# Patient Record
Sex: Male | Born: 1935 | Race: White | Hispanic: No | Marital: Married | Smoking: Former smoker
Health system: Southern US, Community
[De-identification: ages and names within clinical notes are randomized; demographics above are authoritative.]

## PROBLEM LIST (undated history)

## (undated) DIAGNOSIS — F0390 Unspecified dementia without behavioral disturbance: Secondary | ICD-10-CM

## (undated) DIAGNOSIS — C189 Malignant neoplasm of colon, unspecified: Secondary | ICD-10-CM

## (undated) DIAGNOSIS — F329 Major depressive disorder, single episode, unspecified: Secondary | ICD-10-CM

## (undated) DIAGNOSIS — I10 Essential (primary) hypertension: Secondary | ICD-10-CM

## (undated) DIAGNOSIS — I350 Nonrheumatic aortic (valve) stenosis: Secondary | ICD-10-CM

## (undated) DIAGNOSIS — N400 Enlarged prostate without lower urinary tract symptoms: Secondary | ICD-10-CM

## (undated) DIAGNOSIS — Z8669 Personal history of other diseases of the nervous system and sense organs: Secondary | ICD-10-CM

## (undated) DIAGNOSIS — Z79899 Other long term (current) drug therapy: Secondary | ICD-10-CM

## (undated) DIAGNOSIS — G63 Polyneuropathy in diseases classified elsewhere: Secondary | ICD-10-CM

## (undated) DIAGNOSIS — G609 Hereditary and idiopathic neuropathy, unspecified: Secondary | ICD-10-CM

## (undated) DIAGNOSIS — K5790 Diverticulosis of intestine, part unspecified, without perforation or abscess without bleeding: Secondary | ICD-10-CM

## (undated) DIAGNOSIS — N186 End stage renal disease: Secondary | ICD-10-CM

## (undated) DIAGNOSIS — D649 Anemia, unspecified: Secondary | ICD-10-CM

## (undated) DIAGNOSIS — G459 Transient cerebral ischemic attack, unspecified: Secondary | ICD-10-CM

## (undated) DIAGNOSIS — E039 Hypothyroidism, unspecified: Secondary | ICD-10-CM

## (undated) DIAGNOSIS — K635 Polyp of colon: Secondary | ICD-10-CM

## (undated) DIAGNOSIS — N19 Unspecified kidney failure: Secondary | ICD-10-CM

## (undated) DIAGNOSIS — D61818 Other pancytopenia: Secondary | ICD-10-CM

## (undated) DIAGNOSIS — I4891 Unspecified atrial fibrillation: Secondary | ICD-10-CM

## (undated) DIAGNOSIS — F32A Depression, unspecified: Secondary | ICD-10-CM

## (undated) DIAGNOSIS — K648 Other hemorrhoids: Secondary | ICD-10-CM

## (undated) DIAGNOSIS — G610 Guillain-Barre syndrome: Secondary | ICD-10-CM

## (undated) DIAGNOSIS — K469 Unspecified abdominal hernia without obstruction or gangrene: Secondary | ICD-10-CM

## (undated) DIAGNOSIS — K6289 Other specified diseases of anus and rectum: Secondary | ICD-10-CM

## (undated) DIAGNOSIS — R569 Unspecified convulsions: Secondary | ICD-10-CM

## (undated) DIAGNOSIS — E785 Hyperlipidemia, unspecified: Secondary | ICD-10-CM

## (undated) DIAGNOSIS — Z8619 Personal history of other infectious and parasitic diseases: Secondary | ICD-10-CM

## (undated) DIAGNOSIS — H9190 Unspecified hearing loss, unspecified ear: Secondary | ICD-10-CM

## (undated) DIAGNOSIS — E213 Hyperparathyroidism, unspecified: Secondary | ICD-10-CM

## (undated) HISTORY — DX: Unspecified abdominal hernia without obstruction or gangrene: K46.9

## (undated) HISTORY — DX: Unspecified dementia, unspecified severity, without behavioral disturbance, psychotic disturbance, mood disturbance, and anxiety: F03.90

## (undated) HISTORY — DX: Guillain-Barre syndrome: G61.0

## (undated) HISTORY — DX: Other long term (current) drug therapy: Z79.899

## (undated) HISTORY — DX: Hereditary and idiopathic neuropathy, unspecified: G60.9

## (undated) HISTORY — DX: Essential (primary) hypertension: I10

## (undated) HISTORY — DX: Personal history of other infectious and parasitic diseases: Z86.19

## (undated) HISTORY — DX: Anemia, unspecified: D64.9

## (undated) HISTORY — DX: End stage renal disease: N18.6

## (undated) HISTORY — PX: SKIN CANCER EXCISION: SHX779

## (undated) HISTORY — DX: Depression, unspecified: F32.A

## (undated) HISTORY — DX: Polyneuropathy in diseases classified elsewhere: G63

## (undated) HISTORY — PX: KIDNEY TRANSPLANT: SHX239

## (undated) HISTORY — DX: Polyp of colon: K63.5

## (undated) HISTORY — DX: Major depressive disorder, single episode, unspecified: F32.9

## (undated) HISTORY — DX: Nonrheumatic aortic (valve) stenosis: I35.0

## (undated) HISTORY — DX: Other hemorrhoids: K64.8

## (undated) HISTORY — DX: Unspecified convulsions: R56.9

## (undated) HISTORY — DX: Benign prostatic hyperplasia without lower urinary tract symptoms: N40.0

## (undated) HISTORY — DX: Hyperlipidemia, unspecified: E78.5

## (undated) HISTORY — DX: Malignant neoplasm of colon, unspecified: C18.9

## (undated) HISTORY — DX: Hypothyroidism, unspecified: E03.9

## (undated) HISTORY — DX: Unspecified kidney failure: N19

## (undated) HISTORY — DX: Diverticulosis of intestine, part unspecified, without perforation or abscess without bleeding: K57.90

## (undated) HISTORY — DX: Unspecified atrial fibrillation: I48.91

## (undated) HISTORY — DX: Transient cerebral ischemic attack, unspecified: G45.9

## (undated) HISTORY — DX: Other specified diseases of anus and rectum: K62.89

---

## 1997-05-17 ENCOUNTER — Other Ambulatory Visit: Admission: RE | Admit: 1997-05-17 | Discharge: 1997-05-17 | Payer: Self-pay | Admitting: Nephrology

## 1997-07-07 ENCOUNTER — Other Ambulatory Visit: Admission: RE | Admit: 1997-07-07 | Discharge: 1997-07-07 | Payer: Self-pay | Admitting: Nephrology

## 1999-02-08 ENCOUNTER — Inpatient Hospital Stay (HOSPITAL_COMMUNITY): Admission: EM | Admit: 1999-02-08 | Discharge: 1999-02-10 | Payer: Self-pay | Admitting: Emergency Medicine

## 1999-02-08 ENCOUNTER — Encounter (INDEPENDENT_AMBULATORY_CARE_PROVIDER_SITE_OTHER): Payer: Self-pay | Admitting: *Deleted

## 1999-02-08 ENCOUNTER — Ambulatory Visit (HOSPITAL_COMMUNITY): Admission: RE | Admit: 1999-02-08 | Discharge: 1999-02-08 | Payer: Self-pay | Admitting: Internal Medicine

## 1999-02-08 ENCOUNTER — Encounter: Payer: Self-pay | Admitting: Internal Medicine

## 1999-03-23 ENCOUNTER — Emergency Department (HOSPITAL_COMMUNITY): Admission: EM | Admit: 1999-03-23 | Discharge: 1999-03-23 | Payer: Self-pay | Admitting: Emergency Medicine

## 1999-06-05 ENCOUNTER — Encounter (INDEPENDENT_AMBULATORY_CARE_PROVIDER_SITE_OTHER): Payer: Self-pay | Admitting: *Deleted

## 1999-06-05 ENCOUNTER — Inpatient Hospital Stay (HOSPITAL_COMMUNITY): Admission: AD | Admit: 1999-06-05 | Discharge: 1999-06-11 | Payer: Self-pay | Admitting: Nephrology

## 1999-06-05 ENCOUNTER — Encounter: Payer: Self-pay | Admitting: Gastroenterology

## 1999-06-07 ENCOUNTER — Encounter: Payer: Self-pay | Admitting: Gastroenterology

## 1999-12-14 ENCOUNTER — Encounter: Payer: Self-pay | Admitting: Nephrology

## 1999-12-14 ENCOUNTER — Encounter: Admission: RE | Admit: 1999-12-14 | Discharge: 1999-12-14 | Payer: Self-pay | Admitting: Nephrology

## 1999-12-19 ENCOUNTER — Encounter: Admission: RE | Admit: 1999-12-19 | Discharge: 1999-12-19 | Payer: Self-pay | Admitting: Infectious Diseases

## 1999-12-26 ENCOUNTER — Encounter: Admission: RE | Admit: 1999-12-26 | Discharge: 1999-12-26 | Payer: Self-pay | Admitting: Infectious Diseases

## 2000-01-09 ENCOUNTER — Encounter: Admission: RE | Admit: 2000-01-09 | Discharge: 2000-01-09 | Payer: Self-pay | Admitting: Infectious Diseases

## 2000-07-04 ENCOUNTER — Encounter: Admission: RE | Admit: 2000-07-04 | Discharge: 2000-07-04 | Payer: Self-pay | Admitting: Infectious Diseases

## 2000-07-11 ENCOUNTER — Encounter: Admission: RE | Admit: 2000-07-11 | Discharge: 2000-07-11 | Payer: Self-pay | Admitting: Infectious Diseases

## 2000-07-25 ENCOUNTER — Inpatient Hospital Stay (HOSPITAL_COMMUNITY): Admission: AD | Admit: 2000-07-25 | Discharge: 2000-07-27 | Payer: Self-pay | Admitting: Nephrology

## 2000-07-26 ENCOUNTER — Encounter (INDEPENDENT_AMBULATORY_CARE_PROVIDER_SITE_OTHER): Payer: Self-pay | Admitting: *Deleted

## 2000-07-30 ENCOUNTER — Encounter: Admission: RE | Admit: 2000-07-30 | Discharge: 2000-07-30 | Payer: Self-pay | Admitting: Infectious Diseases

## 2000-10-29 ENCOUNTER — Encounter: Admission: RE | Admit: 2000-10-29 | Discharge: 2000-10-29 | Payer: Self-pay | Admitting: Infectious Diseases

## 2000-11-25 ENCOUNTER — Ambulatory Visit (HOSPITAL_COMMUNITY): Admission: RE | Admit: 2000-11-25 | Discharge: 2000-11-25 | Payer: Self-pay | Admitting: Internal Medicine

## 2000-11-25 ENCOUNTER — Encounter: Payer: Self-pay | Admitting: Internal Medicine

## 2001-01-27 ENCOUNTER — Encounter: Payer: Self-pay | Admitting: Oncology

## 2001-01-27 ENCOUNTER — Encounter (INDEPENDENT_AMBULATORY_CARE_PROVIDER_SITE_OTHER): Payer: Self-pay | Admitting: Specialist

## 2001-01-27 ENCOUNTER — Other Ambulatory Visit: Admission: RE | Admit: 2001-01-27 | Discharge: 2001-01-27 | Payer: Self-pay | Admitting: Oncology

## 2001-01-29 ENCOUNTER — Encounter: Payer: Self-pay | Admitting: Nephrology

## 2001-01-29 ENCOUNTER — Inpatient Hospital Stay (HOSPITAL_COMMUNITY): Admission: AD | Admit: 2001-01-29 | Discharge: 2001-02-10 | Payer: Self-pay | Admitting: Nephrology

## 2001-01-30 ENCOUNTER — Encounter: Payer: Self-pay | Admitting: Nephrology

## 2001-02-04 ENCOUNTER — Encounter: Payer: Self-pay | Admitting: Oncology

## 2001-02-07 ENCOUNTER — Encounter: Payer: Self-pay | Admitting: Nephrology

## 2001-02-10 ENCOUNTER — Encounter (INDEPENDENT_AMBULATORY_CARE_PROVIDER_SITE_OTHER): Payer: Self-pay | Admitting: Specialist

## 2001-02-10 ENCOUNTER — Inpatient Hospital Stay (HOSPITAL_COMMUNITY)
Admission: RE | Admit: 2001-02-10 | Discharge: 2001-02-24 | Payer: Self-pay | Admitting: Physical Medicine & Rehabilitation

## 2001-07-02 ENCOUNTER — Encounter: Payer: Self-pay | Admitting: Nephrology

## 2001-07-02 ENCOUNTER — Inpatient Hospital Stay (HOSPITAL_COMMUNITY): Admission: EM | Admit: 2001-07-02 | Discharge: 2001-07-08 | Payer: Self-pay | Admitting: Emergency Medicine

## 2001-07-03 ENCOUNTER — Encounter: Payer: Self-pay | Admitting: Nephrology

## 2002-06-28 ENCOUNTER — Inpatient Hospital Stay (HOSPITAL_COMMUNITY): Admission: EM | Admit: 2002-06-28 | Discharge: 2002-07-03 | Payer: Self-pay | Admitting: Nephrology

## 2002-06-28 ENCOUNTER — Encounter: Payer: Self-pay | Admitting: Nephrology

## 2002-06-29 ENCOUNTER — Encounter: Payer: Self-pay | Admitting: Nephrology

## 2002-06-29 ENCOUNTER — Encounter (INDEPENDENT_AMBULATORY_CARE_PROVIDER_SITE_OTHER): Payer: Self-pay | Admitting: Cardiology

## 2002-07-01 ENCOUNTER — Encounter: Payer: Self-pay | Admitting: Nephrology

## 2002-08-16 ENCOUNTER — Emergency Department (HOSPITAL_COMMUNITY): Admission: EM | Admit: 2002-08-16 | Discharge: 2002-08-16 | Payer: Self-pay | Admitting: Emergency Medicine

## 2003-02-26 ENCOUNTER — Inpatient Hospital Stay (HOSPITAL_COMMUNITY): Admission: AD | Admit: 2003-02-26 | Discharge: 2003-03-02 | Payer: Self-pay | Admitting: Nephrology

## 2003-03-01 ENCOUNTER — Encounter (INDEPENDENT_AMBULATORY_CARE_PROVIDER_SITE_OTHER): Payer: Self-pay | Admitting: Cardiology

## 2003-04-26 ENCOUNTER — Encounter (HOSPITAL_COMMUNITY): Admission: RE | Admit: 2003-04-26 | Discharge: 2003-07-25 | Payer: Self-pay | Admitting: Neurology

## 2003-04-28 ENCOUNTER — Ambulatory Visit (HOSPITAL_COMMUNITY): Admission: RE | Admit: 2003-04-28 | Discharge: 2003-04-28 | Payer: Self-pay | Admitting: Nephrology

## 2003-06-16 ENCOUNTER — Inpatient Hospital Stay (HOSPITAL_COMMUNITY): Admission: EM | Admit: 2003-06-16 | Discharge: 2003-06-30 | Payer: Self-pay | Admitting: Emergency Medicine

## 2004-02-09 ENCOUNTER — Ambulatory Visit: Payer: Self-pay | Admitting: Oncology

## 2004-06-04 ENCOUNTER — Ambulatory Visit: Payer: Self-pay | Admitting: Oncology

## 2004-09-26 ENCOUNTER — Ambulatory Visit: Payer: Self-pay | Admitting: Oncology

## 2004-12-05 ENCOUNTER — Inpatient Hospital Stay (HOSPITAL_COMMUNITY): Admission: EM | Admit: 2004-12-05 | Discharge: 2004-12-07 | Payer: Self-pay | Admitting: Emergency Medicine

## 2004-12-07 ENCOUNTER — Encounter: Payer: Self-pay | Admitting: Cardiology

## 2004-12-07 ENCOUNTER — Ambulatory Visit: Payer: Self-pay | Admitting: Cardiology

## 2005-02-17 IMAGING — CR DG ABD PORTABLE 1V
1 series · 1 of 1 positions shown · non-contrast
Comparison: 06/24/03.

CLINICAL DATA: Hypertension, feeding tube placement. 
 PORTABLE ABDOMEN 06/25/03 AT 2575 HOURS

[view not recorded]
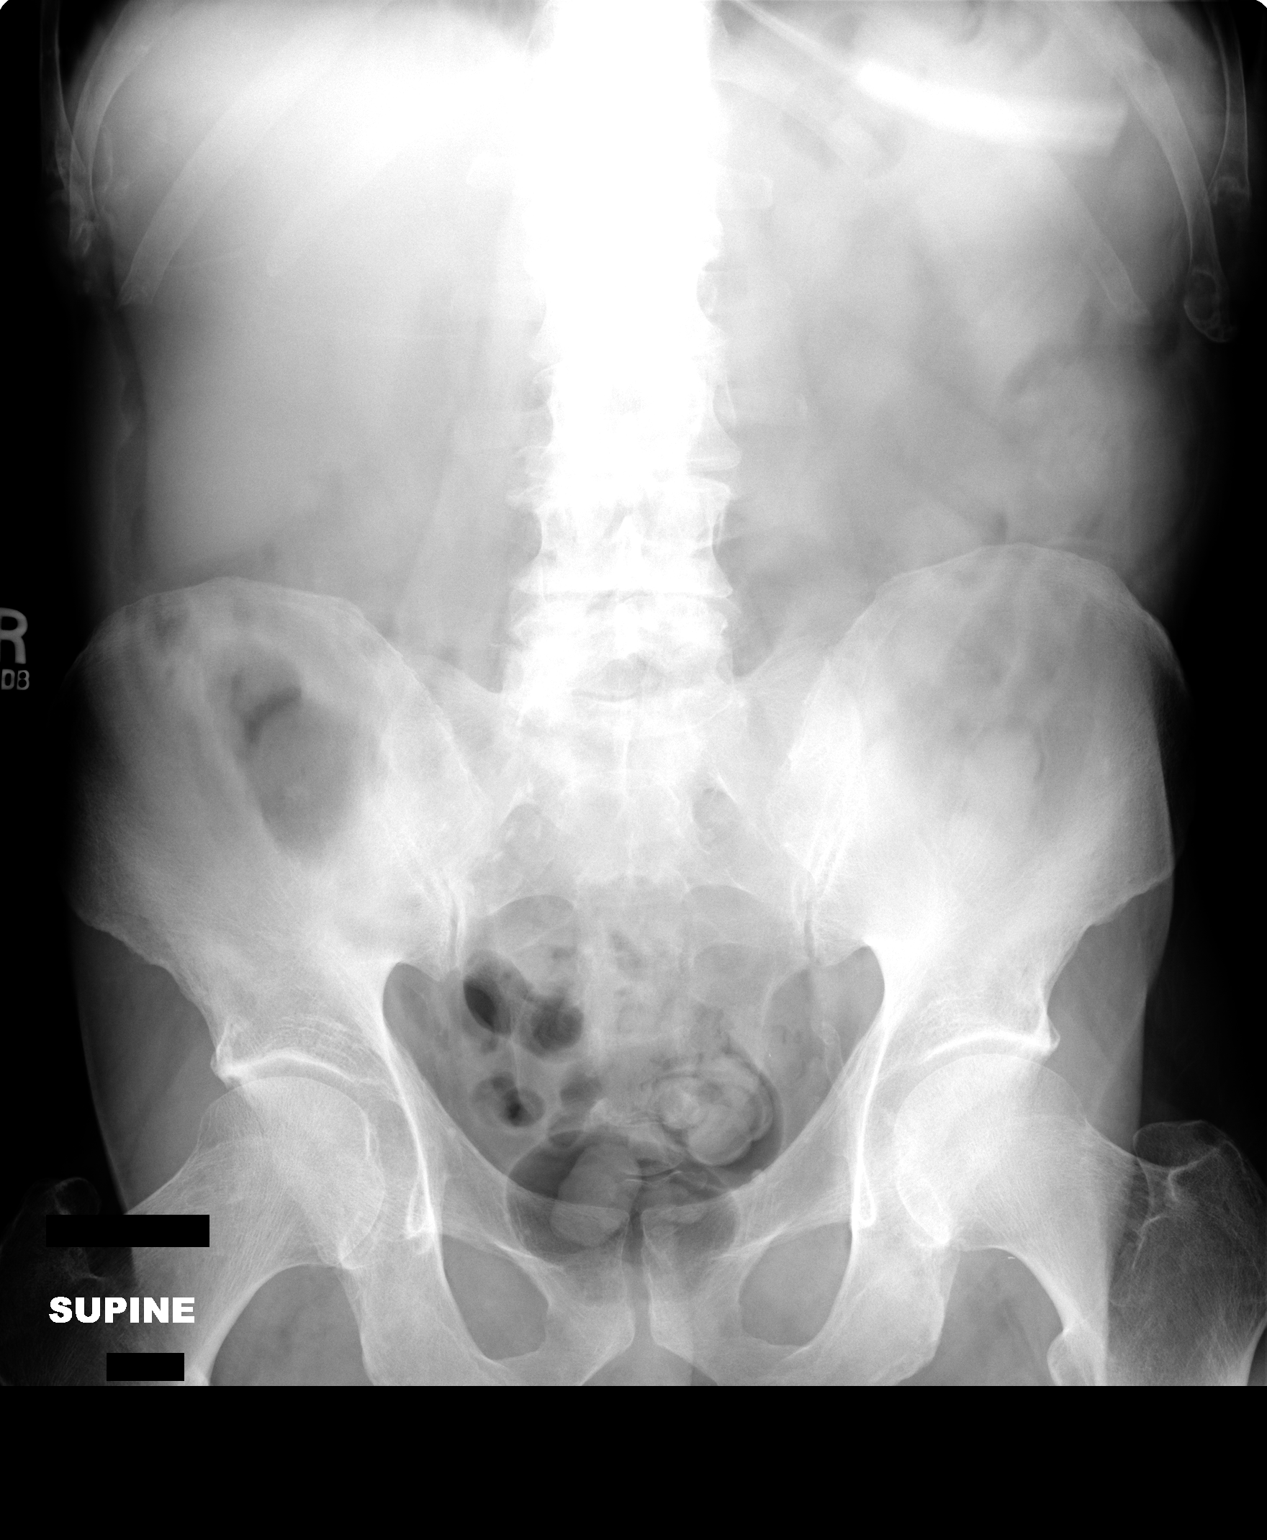

[1 of 1 positions shown; findings below may reference images not displayed]

A Panda type feeding tube remains with its tip in the fundus of the stomach.  Normal bowel gas pattern.  Minimal degenerative changes in the lumbar spine.  
 IMPRESSION
 Feeding tube tip in gastric fundus.

## 2006-02-12 ENCOUNTER — Inpatient Hospital Stay (HOSPITAL_COMMUNITY): Admission: AD | Admit: 2006-02-12 | Discharge: 2006-02-24 | Payer: Self-pay | Admitting: Nephrology

## 2006-05-02 ENCOUNTER — Inpatient Hospital Stay (HOSPITAL_COMMUNITY): Admission: EM | Admit: 2006-05-02 | Discharge: 2006-05-08 | Payer: Self-pay | Admitting: Nephrology

## 2007-12-03 ENCOUNTER — Ambulatory Visit: Payer: Self-pay | Admitting: *Deleted

## 2008-02-23 ENCOUNTER — Ambulatory Visit: Payer: Self-pay | Admitting: Vascular Surgery

## 2008-12-28 ENCOUNTER — Ambulatory Visit: Payer: Self-pay | Admitting: Pulmonary Disease

## 2008-12-28 ENCOUNTER — Inpatient Hospital Stay (HOSPITAL_COMMUNITY): Admission: EM | Admit: 2008-12-28 | Discharge: 2009-01-01 | Payer: Self-pay | Admitting: Emergency Medicine

## 2008-12-29 ENCOUNTER — Encounter (INDEPENDENT_AMBULATORY_CARE_PROVIDER_SITE_OTHER): Payer: Self-pay | Admitting: *Deleted

## 2008-12-29 ENCOUNTER — Ambulatory Visit: Payer: Self-pay | Admitting: Vascular Surgery

## 2008-12-29 ENCOUNTER — Encounter (INDEPENDENT_AMBULATORY_CARE_PROVIDER_SITE_OTHER): Payer: Self-pay | Admitting: Internal Medicine

## 2008-12-31 ENCOUNTER — Encounter (INDEPENDENT_AMBULATORY_CARE_PROVIDER_SITE_OTHER): Payer: Self-pay | Admitting: *Deleted

## 2009-05-05 ENCOUNTER — Ambulatory Visit: Payer: Self-pay | Admitting: Family Medicine

## 2009-05-05 ENCOUNTER — Inpatient Hospital Stay (HOSPITAL_COMMUNITY): Admission: EM | Admit: 2009-05-05 | Discharge: 2009-05-08 | Payer: Self-pay | Admitting: Emergency Medicine

## 2009-06-13 ENCOUNTER — Telehealth: Payer: Self-pay | Admitting: Internal Medicine

## 2009-06-14 ENCOUNTER — Encounter (INDEPENDENT_AMBULATORY_CARE_PROVIDER_SITE_OTHER): Payer: Self-pay | Admitting: *Deleted

## 2009-08-14 DIAGNOSIS — K5730 Diverticulosis of large intestine without perforation or abscess without bleeding: Secondary | ICD-10-CM | POA: Insufficient documentation

## 2009-08-14 DIAGNOSIS — D638 Anemia in other chronic diseases classified elsewhere: Secondary | ICD-10-CM

## 2009-08-14 DIAGNOSIS — K449 Diaphragmatic hernia without obstruction or gangrene: Secondary | ICD-10-CM | POA: Insufficient documentation

## 2009-08-14 DIAGNOSIS — K648 Other hemorrhoids: Secondary | ICD-10-CM | POA: Insufficient documentation

## 2009-08-14 DIAGNOSIS — Z860100 Personal history of colon polyps, unspecified: Secondary | ICD-10-CM | POA: Insufficient documentation

## 2009-08-14 DIAGNOSIS — I359 Nonrheumatic aortic valve disorder, unspecified: Secondary | ICD-10-CM

## 2009-08-14 DIAGNOSIS — Z87898 Personal history of other specified conditions: Secondary | ICD-10-CM

## 2009-08-14 DIAGNOSIS — N186 End stage renal disease: Secondary | ICD-10-CM | POA: Insufficient documentation

## 2009-08-14 DIAGNOSIS — E039 Hypothyroidism, unspecified: Secondary | ICD-10-CM | POA: Insufficient documentation

## 2009-08-14 DIAGNOSIS — E785 Hyperlipidemia, unspecified: Secondary | ICD-10-CM | POA: Insufficient documentation

## 2009-08-14 DIAGNOSIS — E211 Secondary hyperparathyroidism, not elsewhere classified: Secondary | ICD-10-CM

## 2009-08-14 DIAGNOSIS — K6289 Other specified diseases of anus and rectum: Secondary | ICD-10-CM

## 2009-08-14 DIAGNOSIS — I10 Essential (primary) hypertension: Secondary | ICD-10-CM | POA: Insufficient documentation

## 2009-08-14 DIAGNOSIS — M109 Gout, unspecified: Secondary | ICD-10-CM

## 2009-08-14 DIAGNOSIS — Z8719 Personal history of other diseases of the digestive system: Secondary | ICD-10-CM

## 2009-08-14 DIAGNOSIS — Z992 Dependence on renal dialysis: Secondary | ICD-10-CM

## 2009-08-17 ENCOUNTER — Ambulatory Visit: Payer: Self-pay | Admitting: Internal Medicine

## 2010-01-11 ENCOUNTER — Ambulatory Visit: Payer: Self-pay | Admitting: Vascular Surgery

## 2010-02-18 ENCOUNTER — Encounter: Payer: Self-pay | Admitting: Neurology

## 2010-03-01 NOTE — Progress Notes (Signed)
Summary: next COL?  Phone Note From Other Clinic Call back at [phone]   Caller: Danford Bad, nurse with Dr. Jeanie Sewer Call For: Dr. Juanda Chance Summary of Call: would like to know when pt is due for next COL Initial call taken by: Vallarie Mare,  Jun 13, 2009 2:11 PM  Follow-up for Phone Call        DR.Kilynn Fitzsimmons-His chart has been ordered for your review. Please advise. Follow-up by: Laureen Ochs LPN,  Jun 13, 2009 3:06 PM  Additional Follow-up for Phone Call Additional follow up Details #1::        last colon April 2001, he is due for repeat colon now ( 10 years), we banded his hems in 2002. He has multiple medical problema and he would need an OV before colonoscopy. Additional Follow-up by: Hart Carwin MD,  Jun 13, 2009 10:45 PM    Additional Follow-up for Phone Call Additional follow up Details #2::    Toniann Fail notified. Patient  needs an appointment I have offered to schedule an appointment.  She wantst the patient to call and schedule an NP3 with Dr Juanda Chance  Follow-up by: Darcey Nora RN, CGRN,  Jun 14, 2009 10:32 AM

## 2010-03-01 NOTE — Consult Note (Signed)
Summary: Retroperitoneal Hematoma   NAME:  [name], [name]                ACCOUNT NO.:  [id number]      MEDICAL RECORD NO.:  [id number]          PATIENT TYPE:  INP      LOCATION:  2602                         FACILITY:  [facility]      PHYSICIAN:  Larina Earthly, M.D.    DATE OF BIRTH:  03-Apr-1926      DATE OF CONSULTATION:  12/29/2008   DATE OF DISCHARGE:                                    CONSULTATION      REQUESTING PHYSICIAN:  Isidor Holts, MD      REASON FOR CONSULTATION:  Evaluation for retroperitoneal hematoma.      PRIMARY PHYSICIAN:  Gwendlyn Deutscher, MD      CARDIOLOGIST:  Thereasa Solo. Little, MD      NEPHROLOGIST:  Garnetta Buddy, MD      HISTORY:  [name] is an 77 year old Caucasian male with significant past   medical history including end-stage renal disease, ITP, and seizure   disorder who was admitted to [facility] on December 28, 2008.   He was admitted for symptomatic anemia.  He had fallen onto a boat   approximately 6 weeks ago and bruised his left ribs.  About 3 weeks ago,   he also had 1 day of severe nausea and vomiting with dry heaving.  This   led to some abdominal soreness.  He also had an episode of syncope   while in the bathroom around the same time.  Since then, he has had no   further nausea and vomiting and feels his abdominal soreness is   improving.  He has continued to feel quite fatigued, however and has had   continued presyncopal episodes.  Yesterday, he saw his family physician   and labs were ordered with results showing a drop in his hemoglobin to   around 7.5.  This was reportedly decreased from a hemoglobin of around   11 2-3 weeks prior.  He was admitted by the [facility] for   anemia workup and transfusion.  Part of this workup consisted of a CT of   the abdomen and pelvis with contrast with findings showing extensive   multiloculated right pararenal and retroperitoneal fluid collection,   high attenuation in the pararenal  collection suggested hematoma although   the radiologist felt that abscess could not be excluded.  There was   interval increase in the size of hyperdense lesion from the lower pole   of the left kidney and finding of cholelithiasis with no acute pelvic   process.  His admission hemoglobin and hematocrit were 7.9 and 23.1   respectively.  He received 1 unit of packed red blood cells with his   post transfusion labs showing a hemoglobin of 10.4 and hematocrit of 30.   A repeat hemoglobin/hematocrit were ordered and about 6 hours later   which showed a decrease in his hemoglobin now down to 7.8 and hematocrit   of 22.9.  A repeat CBC is still pending.  Vascular Surgery consultation   was requested for evaluation  of a retroperitoneal hematoma and concern   for active bleeding with approximately a three point drop in his   hemoglobin over the previous 6 hours.      PAST MEDICAL HISTORY:   1. End-stage renal disease on hemodialysis Monday, Wednesday, and       Friday in [place].   2. Status post renal transplant in 1998 which failed in 2003 and he is       status post transplant nephrectomy in March 2004.   3. Left Cimino AV fistula in 1997.   4. Dyslipidemia.   5. Hypothyroidism.   6. CMV colitis and encephalitis.   7. Internal hemorrhoids status post banding in 2002.   8. BPH with history of TURP.   9. History of idiopathic thrombocytopenia purpura with reported       platelet count as low as 7000.  He is status post bone marrow       biopsy in 2003 by Dr. Donnie Coffin.   10.History of Guillain-Barre status post plasmapheresis in 2003 and       2005 by Dr. Sandria Manly.   11.History of depression.   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   13.History of anemia.   14.Secondary hypoparathyroidism.   15.History of gout.   16.History of right upper quadrant protrusion times at least 10 years       felt to be an incisional hernia.   17.Thought to have a TIA in 2003 but later felt that he  actually had a       seizure disorder.  He has had no seizures in 2-3 years since being       started on Dilantin.   18.Treatment for excision of left forehead melanoma in 2007.   19.History of transient atrial fibrillation.   20.History of mild aortic stenosis, an ejection fraction of 55% per       echocardiogram in 2005.   21.Prior history had indicated that he had had history of pulmonary       embolism in 2004 and was treated with Coumadin.  However, the       patient and his wife denies this.      ALLERGIES:  He developed severe peripheral neuropathy in his lower   extremities secondary to PROGRAF.      MEDICATIONS:   1. Sorbitol p.r.n.   2. Vitamin D3 2000 units daily.   3. Lisinopril 20 mg 2 tablets b.i.d.   4. Norvasc 10 mg p.o. q.p.m.   5. Dilantin 100 mg 4-5 tablets daily.   6. Colchicine 0.6 mg daily.   7. Coreg 3.125 mg b.i.d.   8. Folic acid 1 mg daily.   9. Folast one daily.   10.Dialyvite daily.      SOCIAL HISTORY:  He is married, he [place]  He did not use alcohol or tobacco for at least 4 years.   He does not drive.      FAMILY HISTORY:  His mother died recently at age 73 of old age.  His   father did have a history of a stroke.  He has a brother who has had a   history of a stroke and diabetes mellitus and glaucoma.  He has a sister   with glaucoma.      REVIEW OF SYSTEMS:  GENERAL:  He had no recent dramatic weight loss but   does report he has lost around 50 pounds gradually over the last 10  years.  He denies fever.  He has had some intermittent chills.  HEENT:   He denies dysphagia.  RESPIRATORY:  He denies shortness of breath.   CARDIAC:  He denies chest pain.  GASTROENTEROLOGY:  See HPI.  Again, he   denies any specific abdominal pain, hematochezia, or recent nausea or   vomiting.  GU:  He does not produce any urine.  NEUROLOGIC:  As   mentioned above, he did have a syncopal episode approximately 3 weeks    ago and has had no syncope since.  MUSCULOSKELETAL:  He has had some   right groin pain for the last few weeks that is increased with raising   his leg.  He has seen his primary physician for this and was supposed to   have an x-ray to evaluate for arthritis.  This was scheduled for today   but will not be done secondary to his hospitalization and require   rescheduling.  HEMATOLOGIC:  He denies any recent infection.  He does   report that he bruises easily.  He has had no obvious signs of bleeding   such as epistaxis or hematochezia.  Again, he denies any history of   prior pulmonary embolism.  PSYCHIATRIC:  He has a history of depression.      PHYSICAL EXAMINATION:  VITAL SIGNS:  Blood pressure 147/59, oxygen   saturation 100% on room air, heart rate 62, respirations 20, temperature   98.4.  GENERAL APPEARANCE:  This is an 77 year old Caucasian male who   appears his stated age.  He is alert and does not appear in any acute   distress.   HEENT:  Pupils are equal, round, and reactive to light.  Sclerae are non-   icteric.  His oral mucosa is pink and moist.  No pharyngeal erythema is   noted.   NECK:  No thyromegaly or lymphadenopathy is noted.  He has palpable   carotid pulses.  No bruits were auscultated.   CHEST:  His lung sounds were clear but diminished on the bases.   Breathing is unlabored.   CARDIAC:  His heart sounds are somewhat faint.  He has a regular rate   and rhythm with 1-2/6 systolic murmur.  No carotid bruits auscultated.   ABDOMEN:  His abdomen is soft, nontender, other than reported mild   soreness in the epigastric region.  There was no guarding.  He has good   bowel sounds.  He has a right upper quadrant scar.  Just lateral to the   scar, there is a soft, nontender protrusion possibly incisional hernia.   No hepatomegaly appreciated.   GENITOURINARY:  Deferred.   EXTREMITIES:  His extremities show no edema.  He does have evidence of a   left Cimino AV fistula  which has two large aneurysms.  There, the skin   is intact.  There is a good thrill.  Both hands are warm.  He has a 1+   ulnar pulse on the left, 2+ radial pulse on the right.  He has 2 to 3+   femoral pulses bilaterally.  On the right, his PT pulse is 1+, DP pulse   is 2+.  On the left, his DP pulse is 1+, PT is 2+.   NEUROLOGIC:  He is alert and oriented x4.  His speech is clear.  He   moves all extremities strongly and symmetrically.      LABORATORY DATA/DIAGNOSTICS:  Most recent hemoglobin and hematocrit was   7.8  and 22.9 respectively.  This was down from 10.4 and 30 with his   admission hemoglobin and hematocrit of 7.9 and 23.1 respectively.  His   repeat H and H is pending.  His white count was 5.8, platelet count 143.   Sodium 138, potassium 3.5, BUN of 20, creatinine 1.92, blood glucose of   120.  INR of 1.4 PTT of 41.  Liver function tests were within normal   limits.  Albumin was 2.5.  BMP 193.  CT scan of the abdomen and pelvis   with contrast see history.      ASSESSMENT AND PLAN:   1. The patient has been evaluated by both myself and Dr. Arbie Cookey.  Dr.       Arbie Cookey has personally reviewed the diagnostic studies, specifically       the CT scan of the abdomen and pelvis.  It is felt that Raymond. Botz       has a spontaneous retroperitoneal hematoma.  There is possibly a       traumatic component based on his history of fall approximately 6       weeks ago.  At this point, Dr. Arbie Cookey feels that the retroperitoneal       hematoma is stable.  The post transfusion hemoglobin of 10.4 was       felt probably erroneous as this was nearly a four point jump after       only 1 unit of blood.  The repeat hemoglobin and hematocrit are       pending.  We will continue to monitor the patient's hemoglobin and       hematocrit closely with serial labs.  He may transfuse as needed.       If he has persistent significant drops in his       hemoglobin/hematocrit, we could consider a CTA or an  arteriogram.       We agreed with transfer to the intensive care unit for close       monitoring but feel it is okay for the patient to the eat.  We will       continue to follow.   2. End-stage renal disease on hemodialysis.  He is being followed by       Nephrology for inpatient hemodialysis.  Of note, he was seen       earlier this year at the [facility] office by Dr. Waverly Ferrari to       evaluate his arteriovenous       fistula aneurysms.  We will continue to monitor these as long as he       is having no difficulty with hemodialysis.   3. Hypertension.  Continue his home medications as his blood pressure       tolerates.  Hospitalists are managing.   4. Seizure disorder, controlled on Dilantin.               Jerold Coombe, P.A.               Larina Earthly, M.D.   Electronically Signed         AWZ/MEDQ  D:  12/29/2008  T:  12/30/2008  Job:  [id number]      cc:   [facility]   Thereasa Solo. Little, M.D.   Gwendlyn Deutscher II, M.D.

## 2010-03-01 NOTE — Procedures (Signed)
Summary: SIGMOIDOSCOPY                    [place]  Patient:    [name], [name]                       MRN: [id number] Proc. Date: 07/26/00 Adm. Date:  [id number] Attending:  Dayle Points CC:         Duke Salvia. Eliott Nine, M.D.   Procedure Report  PROCEDURE PERFORMED:  Flexible sigmoidoscopy.  INDICATIONS:  This 77 year old gentleman with chronic renal failure, status post renal transplant, has had CMV colitis which has been treated and has gone into remission.  He had two discrete episodes of the rectal bleeding and his hemoglobin has dropped.  He is undergoing flexible sigmoidoscopy to assess site of the bleeding which is presumably an anal or sigmoid colon origin.  He had previous colonoscopy within the last two years, which did not show any significant lesions.  He had benign polyps in the past.  ENDOSCOPE:  Fujinon single-channel endoscope.  SEDATION:  None.  PREP:  None.  FINDINGS:  Fujinon single-channel endoscope was passed routinely through rectum to the sigmoid colon.  The patient was monitored by pulse oximeter.  He received no sedation and because of his diarrhea did not receive any prep. The perianal area was very erythematous and pruritic.  Rectal tone was increased, was very tight and very painful anal canal.  There were large hemorrhoids internally which were protruding into the rectal ampulla.  As the endoscope was introduced into the rectum, there was a bleeding which was precipitated by distention of the anal canal and there was bright red exuding from the anal canal hemorrhoid.  The sigmoid mucosa was unremarkable.  There was some liquid stool in the sigmoid colon which was suctioned out.  There were no lesions all the way up to 35 cm.  At that point, the endoscope was retracted and colon decompressed.  The patient tolerated the procedure well.  IMPRESSION: 1. Complete resolution of CMV colitis. 2. Anal bleeding, most likely from  hemorrhoids.  PLAN: 1. Rectal care, including Corticaine cream and Anusol-HC suppositories. 2. Discontinuation of his ______, which is causing his diarrhea. DD:  07/26/00 TD:  07/26/00 Job: [id number]

## 2010-03-01 NOTE — Procedures (Signed)
Summary: ENDOSCOPY   EGD  Procedure date:  02/08/1999  Findings:      Location: [facility]                       [place]  Patient:    [name], [name]                         MRN: [id number] Proc. Date: 02/08/99 Attending:  Hedwig Morton. Juanda Patterson, M.D. [facility] CC:         [name], M.D.                           Procedure Report  PROCEDURE:  Upper endoscopy.  INDICATIONS:  This 77 year old white male, renal transplant patient with chronic rejection, has had a progressive drop in his hemogram despite Epogen.  He also ad episodic abdominal pain above the umbilicus in the midline.  He denies taking any NSAIDS.  He has no previous history of upper GI problems.  He had a normal colonoscopy several months ago for follow up of colon polyps.  He denies any visible rectal bleeding.  He is undergoing an upper endoscopy to evaluate his anemia.  ENDOSCOPE:  Fujinon single-channel video endoscope.  SEDATION:  Versed 9 mg IV.  FINDINGS:  ESOPHAGUS:  The Fujinon single-channel video endoscope passed under direct vision through the posterior oropharynx into the esophagus.  The patient was monitored by pulse oximeter.  His oxygen saturations were normal.  The proximal, mid, and distal esophageal mucosa were normal.  There was no esophagitis. There was a mild, nonobstructing, fibrous ring at the GE junction.  Distal to the ring was a large hiatal hernia.  There were no erosions or any signs of bleeding from this area.  STOMACH:  The stomach was insufflated with air and showed a 5 cm hiatal hernia extending from 36-41 cm from the incisors.  The hernia was non-reducible.  The gastric folds were normal throughout the stomach.  The gastric antrum was unremarkable.  A retroflexion of the endoscope confirmed the presence of hiatal  hernia.  The pyloric outlet was initially spastic but opened up with pressure, nd it showed a normal pyloric  channel.  DUODENUM:  The duodenum, duodenal bulb, and descending duodenum were unremarkable. Small bowel biopsies were taken from the descending duodenum to rule-out primary mucosal disease of the small bowel which would cause iron malabsorption.  The patient tolerated the procedure well.  IMPRESSION:  Hiatal hernia, status post CLO test and status post small bowel biopsies.  PLAN:  The patient was reexamined with a rectal exam after the endoscopy, and his stool was Hemoccult negative.  At this time, we do not have any evidence of GI blood loss.  His dropping H&H may be of other etiology other than due to blood loss.  Because of the large hiatal hernia, I recommend antireflux measures and cid suppressing agents on p.r.n. basis.  Will be happy to see him in the office should he develop GI blood loss. DD:  02/08/99 TD:  02/08/99 Job: [id number]

## 2010-03-01 NOTE — Letter (Signed)
Summary: New Patient letter  [facility]  [address] [place], [place] [address]   Phone: [phone]  Fax: [phone]       06/14/2009 MRN: [id number]  [name] [address] [place], [place]  [address]  Dear [name],  Welcome to the Gastroenterology Division at [facility].    You are scheduled to see Dr. Juanda Chance on 08/17/2009 at 1:30PM on the 3rd floor at [facility], [address]  We ask that you try to arrive at our office 15 minutes prior to your appointment time to allow for check-in.  We would like you to complete the enclosed self-administered evaluation form prior to your visit and bring it with you on the day of your appointment.  We will review it with you.  Also, please bring a complete list of all your medications or, if you prefer, bring the medication bottles and we will list them.  Please bring your insurance card so that we may make a copy of it.  If your insurance requires a referral to see a specialist, please bring your referral form from your primary care physician.  Co-payments are due at the time of your visit and may be paid by cash, check or credit card.     Your office visit will consist of a consult with your physician (includes a physical exam), any laboratory testing he/she may order, scheduling of any necessary diagnostic testing (e.g. x-ray, ultrasound, CT-scan), and scheduling of a procedure (e.g. Endoscopy, Colonoscopy) if required.  Please allow enough time on your schedule to allow for any/all of these possibilities.    If you cannot keep your appointment, please call [phone] to cancel or reschedule prior to your appointment date.  This allows [place] the opportunity to schedule an appointment for another patient in need of care.  If you do not cancel or reschedule by 5 p.m. the business day prior to your appointment date, you will be charged a $50.00 late cancellation/no-show fee.    Thank you for choosing  [facility] for your medical needs.  We appreciate the opportunity to care for you.  Please visit [place] at our website  to learn more about our practice.                     Sincerely,                                                             The Gastroenterology Division

## 2010-03-01 NOTE — Procedures (Signed)
Summary: FLEX/BANDING   Flexible Sigmoidoscopy  Procedure date:  11/25/2000  Findings:                                [facility]  Patient:    [name], [name] Visit Number: [id number] MRN: [id number]          Service Type: END Location: ENDO Attending Physician:  Mervin Hack Dictated by:   Hedwig Morton. Juanda Chance, M.D. [facility] Proc. Date: 11/25/00 Admit Date:  11/25/2000   CC:         Fayrene Fearing L. Deterding, M.D.   Procedure Report  PROCEDURE:   Flexible sigmoidoscopy with destruction of internal hemorrhoids.  ENDOSCOPIST:  Hedwig Morton. Juanda Chance, M.D.  INDICATIONS:  This 77 year old gentleman has chronic renal failure and had significant lower GI bleed secondary to hemorrhoids.  This occurred on several occasions requiring blood transfusions in July 2002.  The patient also has a history of CMV colitis which has been currently under control.  Because of his gout, he has been on colchicine 0.6 mg which has intermittently given him diarrhea.  Because of the significant amount of bleeding associated with his hemorrhoids and the failure for the hemorrhoids to respond to conservative measures, he is undergoing attempt at banding at the internal hemorrhoids.  ENDOSCOPE:  Olympus single channel videoscope.  SEDATION:  Versed 5 mg IV, fentanyl 100 mcg IV.  DESCRIPTION OF PROCEDURE: The Olympus single channel videoscope was passed directly into the rectum to the sigmoid colon.  The patient was monitored by the pulse oximeter.  His oxygen saturation was normal. Prep was excellent. Rectal tone was somewhat increased with some irregular anal mucosa and rubbery tissue with some skin tags internally.  On retroflexion, there were first grade hemorrhoids with some hypertrophied papilla at the dentate line and old thrombosed hemorrhoids.  The mucosa of the sigmoid colon was somewhat hypervascular but there was no acute colitis.  Exam was carried up to 40 cm. The endoscope was then  retracted back to the rectum, retroflexed and four hemorrhoids were banded using standard bent ligator by Wilson-Mccahill.  There was as small amount of bleeding from the banded hemorrhoids.  The patient tolerated the procedure well and had minimal discomfort.  IMPRESSION: First grade internal hemorrhoids status post banding x 4.  PLAN: 1. Anusol HC suppository q.d. 2. Lindamantle cream apply topically to the rectum b.i.d. 3. Office visit in one week. Dictated by:   Hedwig Morton. Juanda Chance, M.D. [facility] Attending Physician:  Mervin Hack DD:  11/25/00 TD:  11/25/00 Job: [id number]

## 2010-03-01 NOTE — Assessment & Plan Note (Signed)
Summary: discuss COL...as.   History of Present Illness Visit Type: Initial Consult Primary GI MD: Lina Sar MD Primary Provider: Gwendlyn Deutscher, MD Requesting Provider: Gwendlyn Deutscher, MD Chief Complaint: Discuss colon recall. Pt denies any GI sx at this time. History of Present Illness:   This is an 77 year old white male who is here to discuss having a screening colonoscopy. His last colonoscopy was in May 2001 when he was hospitalized with acute diarrhea and bleeding. He was diagnosed with CMV colitis. Colon biopsies showed a viral cytopathic effect.. A colonoscopy was incomplete ( to the hepatic flexure only)  because of the looping of the scope. He has had several serious medical problems which are limiting his tolerance to conscious sedation and endoscopic procedures. He has chronic renal failure and is status post failed renal transplant in 1998. He is also status  post rejection of nephrectomy in 2004. He has been back on dialysis since 2003. He has a history of ITP and aortic stenosis. He also had a recent hospitalization for volume overload in April 2011. He was hospitalized for anemia in December 2010 due to a retroperitoneal hematoma from a fall. 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 He has asymptomatic  cholelithiasis found incidentally on a CT scan of the abdomen. He has no lower GI complaints. He takes occasional Senokot or stool softeners. There is no family history of colon cancer. An upper endoscopy in January 2001 showed a hiatal hernia 5 cm in length which was not reducible. Small bowel biopsiesconfirmed normal villous pattern   GI Review of Systems      Denies abdominal pain, acid reflux, belching, bloating, chest pain, dysphagia with liquids, dysphagia with solids, heartburn, loss of appetite, nausea, vomiting, vomiting blood, weight loss, and  weight gain.        Denies anal fissure, black tarry stools, change in bowel habit,  constipation, diarrhea, diverticulosis, fecal incontinence, heme positive stool, hemorrhoids, irritable bowel syndrome, jaundice, light color stool, liver problems, rectal bleeding, and  rectal pain. Preventive Screening-Counseling & Management  Alcohol-Tobacco     Smoking Status: never    Current Medications (verified): 1)  Cefuroxime Axetil 500 Mg Tabs (Cefuroxime Axetil) .... Take 1 Tablet By Mouth Two Times A Day 2)  Amlodipine Besylate 10 Mg Tabs (Amlodipine Besylate) .... Take 1 Tablet By Mouth At Bedtime 3)  Aspirin 81 Mg Tbec (Aspirin) .... Take 1 Tablet By Mouth Once A Day 4)  Carvedilol 6.25 Mg Tabs (Carvedilol) .... Take 1 Tablet By Mouth Two Times A Day 5)  Colcrys 0.6 Mg Tabs (Colchicine) .... Take 1 Tablet By Mouth Once Daily 6)  Epogen (Unknown Dosage) .... Take 1 Injetion Every Monday, Wednesday, Friday With Dialysis 7)  Folast 2.8-25-2 Mg Tabs (L-Methylfolate-B6-B12) .... Take 1 Tablet By Mouth Once Daily 8)  Folic Acid 1 Mg Tabs (Folic Acid) .... Take 1 Tablet By Mouth Once Daily 9)  Metronidazole 0.75 % Gel (Metronidazole) .... Apply To Face Topically Twice Daily 10)  Mirtazapine 15 Mg Tabs (Mirtazapine) .... Take 1 Tablet By Mouth Once Daily 11)  Phenytoin Sodium Extended 100 Mg Caps (Phenytoin Sodium Extended) .... Take 2 Tablet By Mouth Two Times A Day 5 Days A Week, 2 Tablet in Am and 3 Tablets in Pm On Saturday and Sunday 12)  Renal Multivitamin/zinc  Tabs (Multiple Vitamin) .... Take 1 Tablet By Mouth Once Daily 13)  Tums 500 Mg Chew (Calcium Carbonate Antacid) .... Take 1 Tablet By Mouth  Two Times A Day As Needed 14)  Venofer 20 Mg/ml Soln (Iron Sucrose) .Marland Kitchen.. 100 Mg During Dialysis Three Times A Week 15)  Vitamin D3 (Unknown Dosage) .... Take 1 Tablet By Mouth Once Daily 16)  Simvastatin 20 Mg Tabs (Simvastatin) .... One Tablet By Mouth Once Daily 17)  Losartan Potassium 50 Mg Tabs (Losartan Potassium) .... One Tablet By Mouth Once Daily 18)  Levothyroxine Sodium 75  Mcg Tabs (Levothyroxine Sodium) .... One Tablet By Mouth Once Daily  Allergies (verified): 1)  ! * Prograf  Past History:  Past Medical History: Current Problems:  COLONIC POLYPS, ADENOMATOUS, HX OF (ICD-V12.72) RENAL FAILURE, END STAGE (ICD-585.6) HYPERLIPIDEMIA (ICD-272.4) GOUT, UNSPECIFIED (ICD-274.9) BENIGN PROSTATIC HYPERTROPHY, HX OF (ICD-V13.8) AORTIC STENOSIS (ICD-424.1) CLOSTRIDIUM DIFFICILE COLITIS, HX OF (ICD-V12.79) HYPERPARATHYROIDISM, SECONDARY (ICD-252.02) ANEMIA OF CHRONIC DISEASE (ICD-285.29) HYPERTENSION (ICD-401.9) HYPOTHYROIDISM (ICD-244.9) HIATAL HERNIA (ICD-553.3) DIVERTICULOSIS, COLON (ICD-562.10) Hx of PROCTITIS (ICD-569.49) HEMORRHOIDS, INTERNAL (ICD-455.0)   Seizures  Past Surgical History: Reviewed history from 08/14/2009 and no changes required. Renal Transplant  Family History: Family History of Diabetes: Brother Family History of Liver Disease: Son with Hepatitis C No FH of Colon Cancer:  Social History: Married Illicit Drug Use - no Patient has never smoked.  Alcohol Use - no Daily Caffeine Use Smoking Status:  never  Review of Systems       The patient complains of arthritis/joint pain, hearing problems, and vision changes.  The patient denies allergy/sinus, anemia, anxiety-new, back pain, blood in urine, breast changes/lumps, change in vision, confusion, cough, coughing up blood, depression-new, fainting, fatigue, fever, headaches-new, heart murmur, heart rhythm changes, itching, menstrual pain, muscle pains/cramps, night sweats, nosebleeds, pregnancy symptoms, shortness of breath, skin rash, sleeping problems, sore throat, swelling of feet/legs, swollen lymph glands, thirst - excessive , urination - excessive , urination changes/pain, urine leakage, and voice change.         Pertinent positive and negative review of systems were noted in the above HPI. All other ROS was otherwise negative.   Vital Signs:  Patient profile:   77 year  old male Height:      67 inches Weight:      145.50 pounds BMI:     22.87 Pulse rate:   74 / minute Pulse rhythm:   regular BP sitting:   162 / 68  (right arm) Cuff size:   regular  Vitals Entered By: Christie Nottingham CMA Duncan Dull) (August 17, 2009 1:27 PM)  Physical Exam  General:  alert, oriented and in no distress, appears weak. Ambulates independently. Eyes:  nonicteric. Mouth:  no lesions. Neck:  bilateral carotid murmurs transmitted from precordium. Lungs:  clear to auscultation. Heart:  2/6 systolic murmur radiating to the carotids, irregular heart rhythm. Abdomen:  soft abdomen with healed surgical scars. Normoactive bowel sounds. No palpable mass or stool. Rectal:  soft Hemoccult negative stool. Extremities:  no edema. AV fistula left forearm. Skin:  Marked hyperpigmentation of his forearms; status post recent excision of skin cancer on the left arm.   Impression & Recommendations:  Problem # 1:  COLONIC POLYPS, ADENOMATOUS, HX OF (ICD-V12.72) Patient has a history of adenomatous polyps of the colon seen on colonoscopy in 1991, 1992 and 1996. There was also a polyp in 2001 on colonoscopy. Her last colonoscopy in 2001 showed CMV colitis but no polyps. He is at very high risk for conscious sedation .Colonoscopy  prep may be risky due to  aortic stenosis . He had a recent  hospitalization for fluid overload. He is  Hemoccult-negative today. We have discussed benefits and risks of colonoscopy and both agree that he will not proceed with colonoscopy at this time because risks are high and the benefits are low .  Problem # 2:  HIATAL HERNIA (ICD-553.3) Patient has a large 5 cm hiatal hernia seen on an upper endoscopy in 2001. He is not having any symptoms of dysphagia.  Patient Instructions: 1)  Continue a high-fiber diet. 2)  May take stool softeners or probiotics as needed to regulate bowel habits. 3)  No plans for recall colonoscopy on a screening basis but we will be happy to see  the patient for specific problems. 4)  Copy sent to : Dr Gwendlyn Deutscher 5)  The medication list was reviewed and reconciled.  All changed / newly prescribed medications were explained.  A complete medication list was provided to the patient / caregiver.

## 2010-03-01 NOTE — Procedures (Signed)
Summary: COLONOSCOPY   Colonoscopy  Procedure date:  06/07/1999  Findings:      Location:  [facility]                       [place]  Patient:    [name], [name]                       MRN: [id number] Proc. Date: 06/07/99 Adm. Date:  [id number] Attending:  Lurlean Nanny CC:         Llana Aliment. Deterding, M.D.             Hedwig Morton. Juanda Chance, M.D. [facility]             Lovelle Lema T. Pleas Koch., M.D. [facility]                           Procedure Report  PROCEDURE:  Colonoscopy with multiple biopsies.  ENDOSCOPIST:  Venita Lick. Pleas Koch., M.D. [facility]  REFERRING PHYSICIAN:  Llana Aliment. Deterding, M.D.  INDICATIONS:  An 77 year old white male, status post renal transplant, maintained on CellCept and prednisone, who developed severe diarrhea, weight loss, and small volume hematochezia.  PHYSICAL EXAMINATION:  CHEST:  Clear to auscultation.  CARDIAC:  Regular rate and rhythm without murmurs.  NEUROLOGIC:  Alert and oriented x 3.  ANESTHESIA:  Fentanyl 80 mg IV, Versed 8 mg IV.  MONITORING:  Automated blood pressure monitor, pulse oximeter, and cardiac monitor.  Low-flow oxygen was given by nasal cannula throughout the procedure. COMPLICATIONS:  The procedure was well tolerated with no immediate complications.  DESCRIPTION OF PROCEDURE:  After the nature of the procedure was discussed with the patient including a discussion of the risks, benefits, and alternatives, he consented to proceed.  He was then comfortably sedated in the left lateral decubitus position.  Digital rectal examination revealed external hemorrhoids and no internal lesions.  The anal canal was mildly tender.  The Olympus pediatric video colonoscope was inserted into the rectal vault.  Air was insufflated, and the colonoscope was advanced to the level of the transverse colon.  There was significant looping of the colonoscope in the sigmoid colon.  Due to patient discomfort and marked  colitis noted on the examination, I discontinued the exam at the level of the hepatic flexure. There was marked inflammation, erythema, friability, erosions, and a complete loss of the vascular pattern throughout the transverse colon.  Throughout the splenic flexure, descending colon, sigmoid colon, and rectum, there was more patchy involvement of a similar-appearing colitis.  Diverticulosis was also noted in the sigmoid colon.  Multiple biopsies were obtained in the transverse colon and in the sigmoid colon.  Small internal hemorrhoids were noted on retroflexed view of the distal rectum.  The colon was decompressed.  The colonoscope was withdrawn from the patient.  IMPRESSION: 1. Colonoscopy to the hepatic flexure. 2. Patchy and nonspecific appearing colitis - more severe in the transverse    colon. 3. Diverticulosis. 4. Small internal hemorrhoids. 5. External hemorrhoids.  RECOMMENDATIONS: 1. Await biopsies. 2. Begin empiric Colazol 750 mg 2 p.o. b.i.d. and continue Flagyl for now. 3. Rectal and hemorrhoidal care instructions and medications. 4. Further plans pending biopsies. DD:  06/07/99 TD:  06/09/99 Job: [id number]

## 2010-03-01 NOTE — Discharge Summary (Signed)
Summary: Acute Blood Loss Anemia   NAME:  [name], [name]                ACCOUNT NO.:  [id number]      MEDICAL RECORD NO.:  [id number]          PATIENT TYPE:  INP      LOCATION:  6736                         FACILITY:  [facility]      PHYSICIAN:  Isidor Holts, M.D.  DATE OF BIRTH:  [birth year]      DATE OF ADMISSION:  12/28/2008   DATE OF DISCHARGE:  01/01/2009                                  DISCHARGE SUMMARY      PRIMARY MD:  Dr. Hyman Hopes, nephrologist.      PRIMARY CARDIOLOGIST:  Dr. Julieanne Manson.      DISCHARGE DIAGNOSES:   1. Acute blood loss anemia, secondary to retroperitoneal bleed.       Required transfusion of 2 units of PRBC.   2. End-stage renal disease on hemodialysis.   3. Secondary hyperparathyroidism.   4. Anemia of chronic disease.   5. Hypothyroidism.   6. Gout.   7. Dyslipidemia.   8. History of Guillain-Barre syndrome, with residual neuropathy.   9. Benign prostatic hypertrophy.   10.History of paroxysmal atrial fibrillation.   11.Aortic stenosis.   12.History of idiopathic thrombocytopenic purpura.   13.History of Diverticulosis.   14.History of hemorrhoids.   15.Previous history of colon polyp.      DISCHARGE MEDICATIONS:   1. Sorbitol 2 tablespoons p.o. p.r.n. q.4 h . for indigestion.   2. Vitamin D 03-998 units p.o. daily.   3. Lisinopril 40 mg p.o. b.i.d.   4. Norvasc 10 mg p.o. q.p.m.   5. Eletone cream transderm 1 application b.i.d. p.r.n. around eyes.   6. Dilantin (100 mg) 400 mg p.o. on Mondays, Tuesdays, Thursday,       Fridays and Saturdays and 500 mg on Wednesdays and Sundays.   7. Colchicine 0.6 mg p.o. daily.   8. Carvedilol 3.125 mg p.o. b.i.d.   9. Folic acid 1 mg p.o. daily.   10.Folast/vitamin 1 p.o. daily.   11.Dialyvite renal vitamin 1 p.o. daily.      PROCEDURES:  CT abdomen/pelvis December 29, 2008:  This showed extensive   multiloculated right pararenal and retroperitoneal fluid collections,   high attenuation in the pararenal  collections suggests possible   hematoma, although abscess cannot be excluded.  The larger components   would be appreciable for peritoneal aspirations/drainage.  There is   interval increase in size of hypertense lesion from the lower pole of   left kidney.  There is cholelithiasis; there was no acute pelvic   process.      CONSULTATIONS:   1. Dr. Arrie Aran, nephrologist.   2. Dr. Gretta Began, vascular surgeon.   3. Dr. Koren Bound, critical care medicine.      ADMISSION HISTORY:  As in H&P notes of December 28, 2008, dictated by Dr.   Andreas Blower.  However, in brief, this is an 77 year old male, with   known history of chronic end-stage renal disease on hemodialysis   Mondays, Wednesdays and Fridays, hypertension, anemia of chronic disease   secondary hyperparathyroidism, history of organic brain syndrome  and   remote history of CMV colitis, history of Guillain-Barre syndrome with   residual peripheral neuropathy, known aortic stenosis, gout, benign   prostatic hypertrophy, ITP, Diverticulosis, previous colon polyps,   depression, paroxysmal atrial fibrillation, dyslipidemia, status post   prior failed renal transplantation, presenting with weakness of   approximately 3 weeks' duration, which has become progressive.  He went   for hemodialysis on December 28, 2008, and on routine laboratory testing   was found to have a hemoglobin of 7.0.  Dr. Arrie Aran the nephrologist   was contacted and confirmed that the patient's hemoglobin was normal at   11.0 approximately 1 week prior.  There is a vague history of possible   fall approximately 6 weeks ago.  Be that as it may, the patient was   admitted for further evaluation, investigation and management.      CLINICAL COURSE.:   1. Spontaneous retroperitoneal peritoneal hematoma:  For details of       presentation, refer to admission history above.  As described, the       patient presented with a hemoglobin of 7.0.  He was transfused 1         unit of PRBC on hemodialysis.  However, as he had no history of       hematemesis and/or melena or hematochezia.  It was thought       appropriate to arrange an abdominal/pelvic CT scan to rule out       possible intra-abdominal or retroperitoneal bleed.  For details of       findings, refer to procedure list above.  This confirmed       retroperitoneal hematoma. After an initial post transfusion bump in       hemoglobin to 10.4 on December 29, 2008, the patient's hemoglobin       dropped down to 8.0, and raising the suspicion of continuing       retroperitoneal bleed.  He was transfused with another unit of PRBC       resulting in a hemoglobin of 9.4.  He was transferred to the step-       down unit.  Critical care consultation was called which was kindly       provided by Dr. Molli Knock.  For details of that consultation, refer to       consultation notes of December 29, 2008.  However, since he found       the patient hemodynamically stable, he elected to follow at a       distance.  Vascular surgical consultation was kindly provided by       Dr. Gretta Began; for details of his consultation refer to       consultation notes of December 29, 2008.  He recommended       conservative management and close observation.  The patient's       clinical condition thereafter, remained stable.  There was no       further drop in hemoglobin and by and by December 31, 2008,       hemoglobin was reasonable/stable at 9.1 with a hematocrit of 26.4.       Likely, his hemorrhage had resolved, subsequent reabsorption of       hematoma, albeit gradually, is anticipated.      1. End-stage renal disease:  The patient was continued on his regular       dialysis schedule.  The renal team consultation was kindly provided  by Dr.  Arrie Aran.      1. Gout:  There were no problems referable to this.  The patient       remained asymptomatic from this viewpoint.      1. History of idiopathic thrombocytopenic  purpura:  The patient's       platelet count was 157 at the time of presentation on December 28, 2008, and has remained stable ever since, as a Psychologist, clinical of fact on       December 31, 2008, it was 154.      1. Remote history of paroxysmal atrial fibrillation:  The patient       remained in sinus rhythm throughout the course of his       hospitalization.      DISPOSITION:  The patient on December 31, 2008, was completely   asymptomatic.  There were no new issues.  He was considered clinically   stable for discharge.  Provided no acute problems arise in the interim,   he will be discharged on 01/01/2009.      DIET:  Renal-2-2.      ACTIVITY:  As tolerated, recommended to increase activity slowly.      FOLLOWUP INSTRUCTIONS:  The patient is to follow up with his primary MD,   Dr. Betti Cruz in [place], [place], within 1 week of discharge.  In   addition he is to follow up with his primary nephrologist, Dr. Hyman Hopes per   prior scheduled appointment.  He is to follow up with his cardiologist   Dr. Francee Piccolo, routinely, and he has been instructed to continue with   his regular dialysis regimen.  We recommend that the patient's primary   MD as well as his primary nephrologist will monitor his hemoglobin   periodically, at their own discretion.  It is possible the patient may   require repeat abdominal CT scan to check for interval resolution of   retroperitoneal collection.  However, this will be deferred to the   patient's primary MD.               Isidor Holts, M.D.   Electronically Signed            CO/MEDQ  D:  12/31/2008  T:  12/31/2008  Job:  [id number]      cc:   Durenda Hurt, M.D.   Thereasa Solo. Little, M.D.   Garnetta Buddy, M.D.

## 2010-03-27 ENCOUNTER — Encounter: Payer: Self-pay | Admitting: Internal Medicine

## 2010-04-09 ENCOUNTER — Telehealth: Payer: Self-pay | Admitting: *Deleted

## 2010-04-10 NOTE — Letter (Signed)
Summary: Raymond Collard II  MD  Raymond Collard II  MD   Imported By: Lester Bunceton 04/05/2010 10:28:20  _____________________________________________________________________  External Attachment:    Type:   Image     Comment:   External Document

## 2010-04-17 NOTE — Progress Notes (Signed)
  Phone Note Other Incoming Call back at [phone]   Caller: Christy with Dr. Jeanie Sewer office Summary of Call: Received a voice mail from [place] with Dr. Marnee Guarneri office  at [facility] to make sure  we received the office note that they faxed to [place] last week. Called and let her know we did receive the note. Initial call taken by: Jesse Fall RN,  April 09, 2010 9:17 AM

## 2010-04-18 LAB — IRON AND TIBC
Iron: 14 ug/dL — ABNORMAL LOW (ref 42–135)
Saturation Ratios: 14 % — ABNORMAL LOW (ref 20–55)
TIBC: 103 ug/dL — ABNORMAL LOW (ref 215–435)
UIBC: 89 ug/dL

## 2010-04-18 LAB — PHENYTOIN LEVEL, FREE AND TOTAL
Phenytoin Bound: 6.7 mg/L
Phenytoin, Free: 1.78 mg/L (ref 1.00–2.00)
Phenytoin, Total: 8.5 mg/L — ABNORMAL LOW (ref 10.0–20.0)

## 2010-04-18 LAB — CBC
HCT: 27 % — ABNORMAL LOW (ref 39.0–52.0)
Hemoglobin: 9.3 g/dL — ABNORMAL LOW (ref 13.0–17.0)
MCHC: 33.8 g/dL (ref 30.0–36.0)
MCHC: 34.4 g/dL (ref 30.0–36.0)
MCV: 101.1 fL — ABNORMAL HIGH (ref 78.0–100.0)
MCV: 101.3 fL — ABNORMAL HIGH (ref 78.0–100.0)
Platelets: 82 10*3/uL — ABNORMAL LOW (ref 150–400)
Platelets: 94 10*3/uL — ABNORMAL LOW (ref 150–400)
Platelets: 95 10*3/uL — ABNORMAL LOW (ref 150–400)
RBC: 2.63 MIL/uL — ABNORMAL LOW (ref 4.22–5.81)
RBC: 2.67 MIL/uL — ABNORMAL LOW (ref 4.22–5.81)
RBC: 2.98 MIL/uL — ABNORMAL LOW (ref 4.22–5.81)
RDW: 21 % — ABNORMAL HIGH (ref 11.5–15.5)
RDW: 21.6 % — ABNORMAL HIGH (ref 11.5–15.5)
RDW: 21.8 % — ABNORMAL HIGH (ref 11.5–15.5)
WBC: 3.5 10*3/uL — ABNORMAL LOW (ref 4.0–10.5)
WBC: 3.6 10*3/uL — ABNORMAL LOW (ref 4.0–10.5)

## 2010-04-18 LAB — CARDIAC PANEL(CRET KIN+CKTOT+MB+TROPI)
CK, MB: 1.8 ng/mL (ref 0.3–4.0)
CK, MB: 2.3 ng/mL (ref 0.3–4.0)
Total CK: 60 U/L (ref 7–232)
Total CK: 74 U/L (ref 7–232)

## 2010-04-18 LAB — RENAL FUNCTION PANEL
Albumin: 2.6 g/dL — ABNORMAL LOW (ref 3.5–5.2)
BUN: 17 mg/dL (ref 6–23)
CO2: 28 mEq/L (ref 19–32)
CO2: 30 mEq/L (ref 19–32)
Calcium: 8.1 mg/dL — ABNORMAL LOW (ref 8.4–10.5)
Calcium: 8.4 mg/dL (ref 8.4–10.5)
Chloride: 100 mEq/L (ref 96–112)
Chloride: 104 mEq/L (ref 96–112)
Creatinine, Ser: 5.05 mg/dL — ABNORMAL HIGH (ref 0.4–1.5)
GFR calc Af Amer: 7 mL/min — ABNORMAL LOW (ref >60)
GFR calc non Af Amer: 11 mL/min — ABNORMAL LOW (ref >60)
GFR calc non Af Amer: 5 mL/min — ABNORMAL LOW (ref >60)
GFR calc non Af Amer: 7 mL/min — ABNORMAL LOW (ref >60)
Phosphorus: 4.3 mg/dL (ref 2.3–4.6)
Potassium: 4.3 mEq/L (ref 3.5–5.1)
Sodium: 139 mEq/L (ref 135–145)
Sodium: 140 mEq/L (ref 135–145)

## 2010-04-18 LAB — DIFFERENTIAL
Basophils Absolute: 0 10*3/uL (ref 0.0–0.1)
Eosinophils Absolute: 0 10*3/uL (ref 0.0–0.7)
Lymphocytes Relative: 21 % (ref 12–46)
Monocytes Relative: 5 % (ref 3–12)
Neutrophils Relative %: 74 % (ref 43–77)

## 2010-04-18 LAB — GLUCOSE, CAPILLARY

## 2010-04-18 LAB — TSH: TSH: 2.214 u[IU]/mL (ref 0.350–4.500)

## 2010-04-18 LAB — CULTURE, BLOOD (ROUTINE X 2)
Culture: NO GROWTH
Culture: NO GROWTH

## 2010-04-18 LAB — CK TOTAL AND CKMB (NOT AT ARMC): CK, MB: 1.8 ng/mL (ref 0.3–4.0)

## 2010-04-18 LAB — HEPARIN LEVEL (UNFRACTIONATED): Heparin Unfractionated: <0.1 IU/mL — ABNORMAL LOW (ref 0.30–0.70)

## 2010-04-18 LAB — BASIC METABOLIC PANEL
BUN: 41 mg/dL — ABNORMAL HIGH (ref 6–23)
Calcium: 8.2 mg/dL — ABNORMAL LOW (ref 8.4–10.5)
Creatinine, Ser: 8.92 mg/dL — ABNORMAL HIGH (ref 0.4–1.5)
GFR calc non Af Amer: 6 mL/min — ABNORMAL LOW (ref >60)
Potassium: 4.8 mEq/L (ref 3.5–5.1)

## 2010-05-01 LAB — RENAL FUNCTION PANEL
Albumin: 2.8 g/dL — ABNORMAL LOW (ref 3.5–5.2)
BUN: 32 mg/dL — ABNORMAL HIGH (ref 6–23)
CO2: 30 mEq/L (ref 19–32)
CO2: 30 mEq/L (ref 19–32)
Calcium: 8.5 mg/dL (ref 8.4–10.5)
Calcium: 8.6 mg/dL (ref 8.4–10.5)
Calcium: 8.7 mg/dL (ref 8.4–10.5)
Chloride: 95 mEq/L — ABNORMAL LOW (ref 96–112)
Creatinine, Ser: 5.7 mg/dL — ABNORMAL HIGH (ref 0.4–1.5)
Creatinine, Ser: 8.34 mg/dL — ABNORMAL HIGH (ref 0.4–1.5)
GFR calc Af Amer: 8 mL/min — ABNORMAL LOW (ref >60)
GFR calc Af Amer: 8 mL/min — ABNORMAL LOW (ref >60)
GFR calc non Af Amer: 6 mL/min — ABNORMAL LOW (ref >60)
GFR calc non Af Amer: 6 mL/min — ABNORMAL LOW (ref >60)
Phosphorus: 3.4 mg/dL (ref 2.3–4.6)
Potassium: 4.6 mEq/L (ref 3.5–5.1)
Sodium: 134 mEq/L — ABNORMAL LOW (ref 135–145)

## 2010-05-01 LAB — CBC
HCT: 26.4 % — ABNORMAL LOW (ref 39.0–52.0)
HCT: 30 % — ABNORMAL LOW (ref 39.0–52.0)
Hemoglobin: 8.4 g/dL — ABNORMAL LOW (ref 13.0–17.0)
Hemoglobin: 8.5 g/dL — ABNORMAL LOW (ref 13.0–17.0)
Hemoglobin: 9.1 g/dL — ABNORMAL LOW (ref 13.0–17.0)
MCHC: 34.3 g/dL (ref 30.0–36.0)
MCHC: 34.4 g/dL (ref 30.0–36.0)
MCHC: 34.6 g/dL (ref 30.0–36.0)
MCHC: 35 g/dL (ref 30.0–36.0)
MCV: 100.9 fL — ABNORMAL HIGH (ref 78.0–100.0)
MCV: 96.6 fL (ref 78.0–100.0)
MCV: 97.3 fL (ref 78.0–100.0)
Platelets: 143 10*3/uL — ABNORMAL LOW (ref 150–400)
Platelets: 149 10*3/uL — ABNORMAL LOW (ref 150–400)
Platelets: 158 10*3/uL (ref 150–400)
RBC: 2.29 MIL/uL — ABNORMAL LOW (ref 4.22–5.81)
RBC: 2.5 MIL/uL — ABNORMAL LOW (ref 4.22–5.81)
RDW: 13 % (ref 11.5–15.5)
RDW: 15.2 % (ref 11.5–15.5)
RDW: 15.2 % (ref 11.5–15.5)
RDW: 16 % — ABNORMAL HIGH (ref 11.5–15.5)
WBC: 3.7 10*3/uL — ABNORMAL LOW (ref 4.0–10.5)
WBC: 4.5 10*3/uL (ref 4.0–10.5)
WBC: 5.8 10*3/uL (ref 4.0–10.5)

## 2010-05-01 LAB — TYPE AND SCREEN
ABO/RH(D): O POS
Antibody Screen: NEGATIVE

## 2010-05-01 LAB — HEMOGLOBIN AND HEMATOCRIT, BLOOD
HCT: 23 % — ABNORMAL LOW (ref 39.0–52.0)
HCT: 25.1 % — ABNORMAL LOW (ref 39.0–52.0)
HCT: 26.9 % — ABNORMAL LOW (ref 39.0–52.0)
HCT: 27.4 % — ABNORMAL LOW (ref 39.0–52.0)
Hemoglobin: 8 g/dL — ABNORMAL LOW (ref 13.0–17.0)
Hemoglobin: 8.6 g/dL — ABNORMAL LOW (ref 13.0–17.0)
Hemoglobin: 9.4 g/dL — ABNORMAL LOW (ref 13.0–17.0)

## 2010-05-01 LAB — BASIC METABOLIC PANEL
Chloride: 96 mEq/L (ref 96–112)
Creatinine, Ser: 4.1 mg/dL — ABNORMAL HIGH (ref 0.4–1.5)
GFR calc Af Amer: 17 mL/min — ABNORMAL LOW (ref >60)
GFR calc non Af Amer: 14 mL/min — ABNORMAL LOW (ref >60)
Potassium: 4 mEq/L (ref 3.5–5.1)

## 2010-05-01 LAB — FERRITIN: Ferritin: 327 ng/mL — ABNORMAL HIGH (ref 22–322)

## 2010-05-01 LAB — PROTIME-INR: INR: 1.14 (ref 0.00–1.49)

## 2010-05-01 LAB — DIFFERENTIAL
Basophils Relative: 0 % (ref 0–1)
Eosinophils Absolute: 0 10*3/uL (ref 0.0–0.7)
Eosinophils Absolute: 0.2 10*3/uL (ref 0.0–0.7)
Eosinophils Relative: 3 % (ref 0–5)
Lymphocytes Relative: 24 % (ref 12–46)
Lymphs Abs: 0.9 10*3/uL (ref 0.7–4.0)
Lymphs Abs: 1.3 10*3/uL (ref 0.7–4.0)
Monocytes Absolute: 0.8 10*3/uL (ref 0.1–1.0)
Monocytes Relative: 14 % — ABNORMAL HIGH (ref 3–12)
Monocytes Relative: 9 % (ref 3–12)
Neutrophils Relative %: 60 % (ref 43–77)
Neutrophils Relative %: 66 % (ref 43–77)

## 2010-05-01 LAB — COMPREHENSIVE METABOLIC PANEL
ALT: 16 U/L (ref 0–53)
AST: 22 U/L (ref 0–37)
Alkaline Phosphatase: 54 U/L (ref 39–117)
Calcium: 8.4 mg/dL (ref 8.4–10.5)
GFR calc Af Amer: 41 mL/min — ABNORMAL LOW (ref >60)
Glucose, Bld: 120 mg/dL — ABNORMAL HIGH (ref 70–99)
Potassium: 3.5 mEq/L (ref 3.5–5.1)
Sodium: 138 mEq/L (ref 135–145)
Total Protein: 5.1 g/dL — ABNORMAL LOW (ref 6.0–8.3)

## 2010-05-01 LAB — APTT: aPTT: 41 seconds — ABNORMAL HIGH (ref 24–37)

## 2010-05-01 LAB — HEPATIC FUNCTION PANEL
AST: 21 U/L (ref 0–37)
Albumin: 2.7 g/dL — ABNORMAL LOW (ref 3.5–5.2)
Bilirubin, Direct: 0.2 mg/dL (ref 0.0–0.3)
Total Bilirubin: 0.8 mg/dL (ref 0.3–1.2)

## 2010-05-01 LAB — LACTATE DEHYDROGENASE: LDH: 299 U/L — ABNORMAL HIGH (ref 94–250)

## 2010-05-01 LAB — PREPARE RBC (CROSSMATCH)

## 2010-05-01 LAB — CK TOTAL AND CKMB (NOT AT ARMC)
CK, MB: 0.7 ng/mL (ref 0.3–4.0)
Total CK: 56 U/L (ref 7–232)

## 2010-05-01 LAB — VITAMIN B12: Vitamin B-12: >2000 pg/mL — ABNORMAL HIGH (ref 211–911)

## 2010-05-01 LAB — IRON AND TIBC
Iron: 37 ug/dL — ABNORMAL LOW (ref 42–135)
TIBC: 142 ug/dL — ABNORMAL LOW (ref 215–435)
UIBC: 105 ug/dL

## 2010-05-01 LAB — RETICULOCYTES
RBC.: 2.37 MIL/uL — ABNORMAL LOW (ref 4.22–5.81)
RBC.: 2.43 MIL/uL — ABNORMAL LOW (ref 4.22–5.81)
Retic Count, Absolute: 19 10*3/uL (ref 19.0–186.0)
Retic Ct Pct: 1.2 % (ref 0.4–3.1)

## 2010-05-01 LAB — HAPTOGLOBIN: Haptoglobin: <6 mg/dL — ABNORMAL LOW (ref 16–200)

## 2010-06-12 NOTE — Assessment & Plan Note (Signed)
OFFICE VISIT   [name], [name]  DOB:  04-09-1926                                       02/23/2008  ZOXWR#:[id number]   The patient had a left forearm fistula placed in 1997.  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  He  was referred by Dr. Hyman Hopes to evaluate him for further access.  He states  that the fistula has been working adequately and he brought his access  flow report which shows flow rates ranging from 1200-1800 for the last  three treatments.   PHYSICAL EXAMINATION:  On examination this a pleasant 77 year old  gentleman who appears his stated age.  His blood pressure is 176/71,  heart rate is 76.  He has a functioning fistula in the left forearm has  two aneurysms present.  The fistula has a good thrill.  The upper arm  cephalic vein also appears slightly matured and at the antecubital level  the fistula also empties into the basilic system.   We also mapped his cephalic vein in the right arm which shows the vein  in the forearm and upper arm are fairly small with thickened walls in  both the mid forearm and also the mid upper arm.  He does not appear to  be an ideal candidate for a new fistula in the right arm.   I have explained that I think the options are either to convert his  forearm fistula to an upper arm fistula on the left in which case we  would have to ligate the forearm fistula and place a catheter.  The  other option would be to place new access in the right arm and to  continue using the fistula until the right arm access is ready for use.  As above he would probably require a graft in the right arm.  The  patient feels strongly that the fistula is working well and is reluctant  to consider any surgery at this time.  As the fistula appears to be  working reasonably well I think this is reasonable.  I have explained  that if he develops problems with flow rates in the fistula or any  concerns about ulceration of the skin then we should proceed with  converting this to an upper arm fistula with ligating the forearm  fistula and placing a Diatek catheter.  He is agreeable with this plan.  I really do not see any ways to revise the fistula in the forearm as  there are multiple areas that are either aneurysmal or have increased  velocities suggesting mild stenosis.   Di Kindle. Edilia Bo, M.D.  Electronically Signed   CSD/MEDQ  D:  02/23/2008  T:  02/24/2008  Job:  [id number]   cc:   Garnetta Buddy, M.D.

## 2010-06-12 NOTE — Procedures (Signed)
DIALYSIS GRAFT DUPLEX EVALUATION   INDICATION:  Evaluate left AV fistula.   HISTORY:  Left Cimino AV fistula on 10/15/95.   DUPLEX:                                   Duplex Velocities  Inflow artery                   219 cm/s  Inflow anastomosis              739 cm/s  Mid arterial limb               77 cm/s  Mid graft                       393 cm/s  Mid venous limb                 115 cm/s  Outflow anastomosis  Outflow vein                    145 cm/s   IMPRESSION:  1. Patent left forearm arteriovenous fistula with significantly      increased Doppler velocities noted at the anastomosis site and the      proximal forearm levels.  2. Significant dilatation of the mid and distal forearm level      arteriovenous fistula.  3. The left distal brachial artery courses superficially in a looping      manner that is palpable and visibly pulsatile at the surface of the      skin.  4. The right cephalic vein is compressible with wall thickening noted      at the distal brachium and proximal forearm levels.  Diameter      measurements range from 0.17 to 0.36 cm.   ___________________________________________  Di Kindle. Edilia Bo, M.D.   CH/MEDQ  D:  02/23/2008  T:  02/23/2008  Job:  [id number]

## 2010-06-12 NOTE — Assessment & Plan Note (Signed)
OFFICE VISIT   [name], [name]  DOB:  15-Dec-1926                                       01/11/2010  ZOXWR#:[id number]   DATE OF SURGERY:  10/15/1995, consisting of creation of left radial  artery to cephalic AV fistula Cimino shunt by Dr. Hart Rochester.   Patient returns to clinic today concerned about his fistula, proximal  end, of which appears to becoming aneurysmal.  He has had the fistula,  as previously stated, since 1997 and it has been in use for the last 14  years with very little difficulty.   Past medical history consists of high blood pressure, TURP in 1994,  fistula in 1997, kidney transplant in 1998 with kidney removal, 2004.   SOCIAL HISTORY:  He is retired.  He has 3 children.  He discontinued  tobacco approximately 50 years ago.   Review of systems was significant for seizures and joint pain.   Physical findings revealed a very pleasant gentleman in no apparent  distress.  Height is 5 feet 9 inches.  Weight was 150 pounds.  Heart  rate was 64, blood pressure was 214/85 with an O2 sat 99%.  HEENT:  PERRLA, EOMI with normal conjunctiva.  Mucous membranes were pink and  moist.  Lungs were clear to auscultation.  Cardiac exam revealed a  regular rate and rhythm.  Abdomen was soft, nontender.  Musculoskeletal  exam demonstrated no major deformities.  Neurological exam demonstrated  no focal weaknesses.  I did ask Dr. Darrick Penna' assistance in evaluating  this patient.  He entered the room and evaluated the fistula.  He did  recognize 2 aneurysmal areas within the fistula with thinning skin.   Replacement of the fistula was approached with patient; however, at this  time, he would prefer not to have another fistula placed.  He was given  very careful instructions regarding care of the fistula should it begin  to bleed.  He will follow up with [place] on a p.r.n. basis.   Raymond Arms, PA   Janetta Hora. Fields, MD  Electronically Signed   KEL/MEDQ  D:   01/11/2010  T:  01/11/2010  Job:  [id number]

## 2010-06-12 NOTE — Assessment & Plan Note (Signed)
OFFICE VISIT   IRMA, DELANCEY  DOB:  May 23, 1926                                       12/03/2007  UVOZD#:[id number]   The patient is a 77 year old gentleman with end-stage renal failure on  hemodialysis.  Had a left radial cephalic arteriovenous fistula created  in 1997 by Dr. Hart Rochester.  Referred today for evaluation of enlargement of  the fistula.   There are two distinct areas in the forearm of significant enlargement  of the fistula.  No bleeding associated with this.  The fistula appears  patent.  There is also a small pulsatile area noted in the mid upper  arm.  This was previously noted on evaluation by Dr. Hart Rochester in 2005.   I have reassured the patient there are no complicating features  associated with the fistula.  No revision required.   His blood pressure is 153/63 and pulse 67 per minute.   Also a note made to the patient that he can have the fistula accessed in  the upper arm to avoid continued recannulation of the forearm.   Balinda Quails, M.D.  Electronically Signed   PGH/MEDQ  D:  12/03/2007  T:  12/04/2007  Job:  [id number]   cc:   Garnetta Buddy, M.D.

## 2010-06-15 NOTE — H&P (Signed)
NAME:  [name] NO.:  [id number]   MEDICAL RECORD NO.:  [id number]          PATIENT TYPE:  EMS   LOCATION:  MAJO                         FACILITY:  [facility]   PHYSICIAN:  Marlan Palau, M.D.  DATE OF BIRTH:  [birth year]   DATE OF ADMISSION:  12/05/2004  DATE OF DISCHARGE:                                HISTORY & PHYSICAL   HISTORY OF PRESENT ILLNESS:  [name] is a 77 year old right-handed  white male born 03/26/1926 with a history of end-stage renal disease  on hemodialysis.  This patient has a history of hypertension and comes to  [facility] via [facility] today for an evaluation of  confusion.  This patient was apparently undergoing hemodialysis today, had  episode of hypotension and was noted to be somewhat confused after that.  Patient seemed to be a big agitated, moving all fours.  Patient was  transferred to [facility].  Underwent a CT scan of the head that was  unremarkable.  Onset of patient's deficit was around 10-10:30 a.m. today.  Patient has since subsequently transferred up to [facility] and  neurology was called.  CT of the head was repeated at [facility], again was  unremarkable.  Patient appears to have a conductive type aphasia with a  presumed left brain cerebrovascular infarction.  Patient is being admitted  for evaluation of the stroke.   PAST MEDICAL HISTORY:  1.  History of new onset of aphasia consistent with left brain      cerebrovascular infarction.  2.  End-stage renal disease on hemodialysis.  3.  Hypertension.  4.  Hypothyroidism.  5.  Benign prostatic hypertrophy.  6.  History of gout.  7.  Anemia.  8.  Depression.  9.  History of hyperlipidemia.  10. History of ITP.  11. History of pericarditis with atrial fibrillation in the past.  12. History of renal transplant that failed.  13. History of Guillain-Barre syndrome in the past.  14. 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   MEDICATIONS:  1.  Nephro-Vite one tablet daily.  2.  Colchicine 0.6 mg daily.  3.  Megace 80 mg b.i.d.  4.  Restoril 15 mg q.h.s. p.r.n.  5.  Os-Cal two p.o. t.i.d. a.c.  6.  Synthroid 0.75 mg daily.   Patient was placed on testosterone gel within the last two weeks.  Patient  has no known allergies.  Does not smoke or drink.   SOCIAL HISTORY:  Patient is married.  [place].  Has three children, one with hepatitis C.  One has polycythemia.  Parents have history of stroke in a father who is passed away.  Mother is  still alive age 92.  Patient has a brother that has had a stroke, is still  living.  One sister is alive and well, apparently was a kidney donor for  him.   REVIEW OF SYSTEMS:  Somewhat difficult to obtain due to the aphasia but  patient seems to deny headache,  vision changes, numbness or weakness on the  arms or legs, chest pains, abdominal pains, nausea, vomiting.   PHYSICAL EXAMINATION:  VITAL SIGNS:  Blood pressure 138/75, heart rate 98,  respiratory rate 22, temperature afebrile.  GENERAL:  This is a fairly well-developed white male who is alert,  cooperative, calm at the time of evaluation.  HEENT:  Head is atraumatic.  Eyes:  Pupils are equal, round, and reactive to  light.  Disks are flat bilaterally.  NECK:  Supple.  No carotid bruits noted.  RESPIRATORY:  Clear.  CARDIOVASCULAR:  Appears to show a regular rate and rhythm.  No murmurs,  rubs noted.  EXTREMITIES:  Without significant edema.  ABDOMEN:  Reveals positive bowel sounds.  No organomegaly or tenderness  noted.  NEUROLOGIC:  Cranial nerves as above.  Facial symmetry is present.  Patient  has full extraocular movements.  Appears to have full visual fields to  double simultaneous stimulation.  Speech is not dysarthric but patient has  clear problems with naming objects, difficulty with repeating.  Seems to  understand language fairly  well at times, at other times not to well.  Patient reports good pin prick sensation on the face bilaterally.  Good  strength to facial muscles, muscles to head turning/shoulder shrug  bilaterally.  Strength of all fours is symmetric and normal.  Patient  reports some decreased pin prick sensation on the left hand as compared to  the right, otherwise symmetric throughout.  Vibratory sensation is  suppressed at the toes bilaterally, present at the ankles, present in both  hands in a symmetric fashion.  Patient has difficulty understanding speech  well enough to perform finger nose finger or toe to finger.  No drift is  seen.  Deep tendon reflexes are relatively symmetric.  Toes are neutral  bilaterally.  Gait was not tested.   LABORATORIES:  Done through [facility] today include white count of  3.2, hemoglobin of 12.9, hematocrit of 37.6, MCV of 98, platelets of 115.  INR of 1.1.  Sodium 141, potassium 3.3, chloride of 94, bicarbonate of 33,  glucose of 89, BUN of 17, creatinine 5.3.  Calcium 9.3, albumin 4.2, total  protein 8.1, total bilirubin 1.1, SGOT 33, SGPT 29, alkaline phosphatase  119, magnesium of 1.9.  CT of the head is as above.  Chest x-ray shows no  acute process, infiltration, a normal cardiac size.  Troponin I level of 0.  CK is 85.  TSH of 1.54.  EKG reveals left axis deviation, inferior infarct  age undetermined, heart rate of 104, ectopic atrial tachycardia noted.   IMPRESSION:  1.  History of new onset of left brain cerebrovascular infarct with aphasia      syndrome.  2.  End-stage renal disease on hemodialysis.  3.  Hypertension.  4.  Prior history of CMV colitis.  5.  History of peripheral neuropathy.  6.  Hyperparathyroidism.   Examination today shows no weakness.  Patient has primarily aphasia  syndrome.  Will admit this patient for further evaluation.  Patient is approximately five hours out from the initial event at time of this  dictation.    PLAN:  1.  Admission to [facility].  2.  MRI of the brain.  3.  MR angiogram of intracranial/extracranial vessels.  4.  2-D echocardiogram.  5.  Aspirin therapy.  6.  May get speech therapy for speech and language evaluation.  Will follow      patient's clinical course while in-house.  Marlan Palau, M.D.  Electronically Signed     CKW/MEDQ  D:  12/05/2004  T:  12/05/2004  Job:  [id number]   cc:   Duke Salvia. Eliott Nine, M.D.  Fax: [phone]

## 2010-06-15 NOTE — Discharge Summary (Signed)
[place]  Patient:    [name]                        MRN: [id number] Adm. Date:  [id number] Disc. Date: [id number] Attending:  Mervin Hack Dictator:   Dianah Field, P.A.-C. CC:         Hedwig Morton. Juanda Chance, M.D. [facility]             [employer].                           Discharge Summary  ADMITTING DIAGNOSES:  1. Fever and chills, rule out bacteremia associated with recent upper     endoscopy, rule out urinary tract infection or other source for fevers.  2. Recent upper endoscopy for suspected gastrointestinal bleed and anemia.     Recent upper endoscopy showed hiatal hernia alone with no mucosal injury.  3. Chronic renal transplant rejection, status post right kidney transplant     in January of 1998.  4. History of diverticulosis.  5. History of colon polyps.  6. Immunosuppression with prednisone and CellCept.  7. Anemia.  8. Ventral hernia.  9. History of hypertension, not currently being treated with any antihypertensive     medications. 10. Status post transurethral resection of the prostate in 1994. 11. Status post left arm arteriovenous fistula in 1997.  DISCHARGE DIAGNOSES: 1. Fevers without leukocytosis. Chest x-ray showing left upper lobe and    lingular infiltrate representing probable community-acquired pneumonia.    Plan further outpatient antibiotic treatment. 2. Anemia with hemoglobin low of 6.8, status post transfusion with two units    of packed red blood cells during this admission. 3. Status post renal transplant with chronic transplant rejection.  CONSULTATIONS: None.  PROCEDURES: None.  HISTORY OF PRESENT ILLNESS: [name] is a 77 year old male.  His above past medical history is significant for renal transplant with chronic rejection of the transplant.  He is followed by Dr. Beryle Lathe of [facility]. Recently the patient had had worsening anemia not responding well  to Epogen therapy.  He was not clearly heme positive or having any obvious blood loss. He had undergone upper endoscopy early on the day of admission by Dr. Arlyce Dice. This study showed only a hiatal hernia but no real source for the blood loss. Random duodenal biopsies had been obtained but were pending.  That evening he ate out t a local restaurant and on the way home developed acute onset of chills and rigors. there was some associated nausea.  He felt febrile but he did not take his temperature.  Overall, he felt quite bad.  There was no specific abdominal pain. He did have a cough that evening but this was nonproductive.  He came to the emergency room where he was evaluated and admitted by Dr. Lina Sar.  She thought that he might perhaps have bacteremia associated with the recent endoscopy.  In the emergency room he had a temperature to 103.5 with pulse of 115.  Blood pressure was stable.  Oxygen saturations were 96% on room air.  She admitted the patient for supportive care and culturing of blood and urine in order to further evaluate the source of the patients fever.  HOSPITAL COURSE: The patient was admitted and blood cultures were unrevealing.  Urinalysis and urine cultures were negative.  However, chest x-ray did reveal a  confluent left  upper lobe and lingular infiltrate.  This was felt to be the source of his infection.  Interestingly, he did not have a leukocytosis though this may be due to the fact that he takes chronic suppressive therapies including prednisone and CellCept.  In any event, prior to the official diagnosis of pulmonary infiltrate he was started on Unasyn IV.  After the initial high temperature in he emergency room of 103.4, temperatures dropped.  Later in his hospital stay, the  temperature maximum was 100 degrees.  The patients rigors rapidly resolved.  The patient was tolerating solid diet and cough was really quite unremarkable. He was  felt stable for discharge to home with further treatment with oral antibiotics consisting of one weeks worth of oral ampicillin.  He was to resume all of his other medications.  Plans for follow up were as needed with the kidney doctors and with Dr. Delia Chimes  office as needed if he had any further GI-specific complaints.  For general constitutional problems or further ongoing fevers he was to contact his renal doctors.  DISCHARGE MEDICATIONS: 1. Ampicillin 500 mg 1 p.o. q.i.d. for one week. 2. Prednisone 5 mg 2 p.o. q.d. 3. CellCept 250 mg 4 p.o. b.i.d. 4. Prevacid 30 mg p.o. q.d. 5. Epogen shots twice weekly.DD:  03/09/99 TD:  03/11/99 Job: [id number]

## 2010-06-15 NOTE — Consult Note (Signed)
NAME:  [name], [name] NO.:  [id number]   MEDICAL RECORD NO.:  [id number]          PATIENT TYPE:  INP   LOCATION:  5526                         FACILITY:  [facility]   PHYSICIAN:  Pramod P. Pearlean Brownie, MD    DATE OF BIRTH:  1926-03-13   DATE OF CONSULTATION:  02/12/2006  DATE OF DISCHARGE:                                 CONSULTATION   REASON FOR REFERRAL:  Confusion.   HISTORY OF PRESENT ILLNESS:  [name] is a 77 year old Caucasian male  who was brought in by family for evaluation for confusion,  disorientation and not being his usual self for last 3 days.  Admission  history was provided by his daughter who is not available at present at  the bedside, but as per the admission H and P in the chart, the patient  was noted as acting different, forgetting his social security number,  how to sign his name and not taking his medications which was unusual  for him.  This did not resolve and hence he was brought to [facility] yesterday, but was diagnosed with dementia and possible  pneumonia and given one dose of Levaquin and discharged home.  Daughter  noted that the patient woke up last night in the middle of the night and  walked around the house opening doors and cabinets.  The patient  apparently had a similar episode of confusion November 2006 for which he  was admitted to [facility].  At that time he was thought to  have a left brain TIA.  However, MRI scan of the brain did not reveal an  acute infarct.  The patient's symptoms resolved in a couple of days at  that time.   PAST MEDICAL HISTORY:  He has an extensive past medical history.  It is  positive for:  1. Endstage renal disease status post failed renal transplant.  2. Hypertension.  3. CMV colitis.  4. Benign prostatic hypertrophy.  5. Guillain-Barr syndrome treated with plasma exchange in 2003 as      well as recurrence in 2005. He has some mild residual paresthesias,      but no significant  weakness from that.  6. Depression.  7. Hypothyroidism.  8. Peripheral neuropathy.  9. Aortic stenosis.  10.Anemia.  11.Hyperparathyroidism.  12.Gout.  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   HOME MEDICATIONS:  __________ , Folate, Vicodin, Synthroid, Megace,  temazepam, colchicine, Nephro-Vite, Zocor, Senokot, aspirin, sorbitol.   ALLERGIES TO MEDICATIONS:  None.   FAMILY HISTORY:  Noncontributory.   SOCIAL HISTORY:  The patient lives in [place] with his wife.  Son and  daughter live nearby.  He is ex-smoker, quit 50 years ago.  Does not  drink alcohol.   REVIEW OF SYSTEMS:  As per history of present illness.   PHYSICAL EXAMINATION:  GENERAL APPEARANCE:  A pleasant, elderly  Caucasian male who is not in distress.  VITAL SIGNS:  Afebrile, pulse rate 80 per minute regular, temperature  97.6, respiratory rate 20 per minute, blood pressure 185/77, sat is 96%  on room air.  VASCULAR:  Distal pulses are  well.  HEENT:  Head is atraumatic.  ENT exam unremarkable.  NECK:  Neck is supple without bruit.  CARDIAC EXAM:  No murmur or gallop.  LUNGS:  Clear to auscultation.  NEUROLOGICAL EXAM:  The patient is at present awake and alert.  He is  oriented to person and place, but not to time.  He could tell me the  year correctly, but not the month.  He knew the city, but not the  county.  He followed simple one and two-step commands, but not three-  step commands.  He has diminished attention, registration and recall.  Recall is 0/3.  He was not able to name more than four animals in one  minute.  There is no confabulation.  The patient was unable to copy  intersecting pentagons.  Clock drawing he is currently one out of four.  Could draw only the circle.  There is no aphasia, apraxia or dysarthria.  Eye movements are full range without nystagmus.  Visual acuity  __________ .  Face is symmetric.  Palatal movements are normal.  Tongue  is midline.  Motor system exam reveals no upper extremity  drift,  symmetric strength, tone.  Reflexes are diminished in the lower  extremities.  There is no obvious sensory loss.   DATA REVIEWED:  Admission labs are pending at this time.  Review of his  previous medical records showed similar admission in November 2006 for a  TIA.  At that time MRI scan of the brain showed moderate generalized  atrophy without any acute stroke.   IMPRESSION:  An 77 year old gentleman with subacute confusional state of  undetermined etiology.  Possibilities include toxic, metabolic and I  doubt stroke in the absence of focal neurological symptoms.  He may have  a mild underlying dementia with some acute exacerbation.   PLAN:  I would recommend evaluation for treatable causes of cognitive  impairment checking B12, TSH, RPR as well as EEG for seizures and MRI  scan for stroke.  Also evaluate and treat for reversible medical  conditions as per you.  Check UA for infection.   Thank you for the referral.  I will be happy to follow him on consult.  Kindly call for questions.           ______________________________  Sunny Schlein. Pearlean Brownie, MD     PPS/MEDQ  D:  02/12/2006  T:  02/13/2006  Job:  [id number]

## 2010-06-15 NOTE — H&P (Signed)
NAME:  [name], [name] NO.:  [id number]   MEDICAL RECORD NO.:  [id number]                   PATIENT TYPE:  INP   LOCATION:  2927                                 FACILITY:  [facility]   PHYSICIAN:  Mindi Slicker. Lowell Guitar, M.D.               DATE OF BIRTH:  [birth year]   DATE OF ADMISSION:  06/16/2003  DATE OF DISCHARGE:                                HISTORY & PHYSICAL   CHIEF COMPLAINT:  Headache of one week's duration and high blood pressure.   HISTORY OF PRESENT ILLNESS:  [name] is a pleasant 77 year old white male  with history of previous renal transplant on dialysis since June 2003.  The  patient presented to Dr. Renelda Loma office today for routine follow up of his  thrombocytopenia and had elevated blood pressure approximately 220/100  according to the patient's wife.  He also has a history of headache for one  week.  Dr. Donnie Coffin talked with Dr. Lowell Guitar and the patient was transferred to  [facility] emergency department for further evaluation.  The patient and his  wife also report intermittent nonspecific memory changes.  Upon arrival to  the emergency room he recognizes me although he cannot remember my name  which is not unusual since he rarely sees me and is oriented X3.  The  patient denies any visual changes or dizziness.  He has no change in gait  other than his usual problems walking due to peripheral neuropathy.  He was  to have had an MRI and MRA scheduled on Saturday by Dr. Donnie Coffin.  On  presentation he is afebrile.  Blood pressure is 226/98 which came down to  about 199/100 after he got settled in.  Oxygen saturations are 97% on room  air.   PAST MEDICAL HISTORY:  Transplant nephrectomy March 2003.  Pulmonary embolus  March 2003 on short term Coumadin, six months.  History of pericarditis and  right lower lobe pneumonia, atrial fibrillation January 2003, not on  Coumadin at present.  Benign prostatic hypertrophy.  Hypothyroidism. History  of  Guillain-Barre' and plasmapheresis June 2003.  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  Patient has history of depression.  History of Proteus urinary tract  infection.  Hypertension.  History of CMV colitis.  History of  hyperlipidemia although most recent cholesterol was 49 in April.  Gout.   CURRENT MEDICATIONS:  1. Nephro-Vite one tablet per day.  2. Protonix 40 mg per day.  3. Colchicine 0.6 mg per day.  4. Neurontin 200 mg q.h.s.  5. Diltiazem 180 mg on non dialysis days only, takes at suppertime.  6. Restoril 30 mg q.h.s. PRN.  7. Tums 500 one with meals three times a day.  8. Synthroid 75 mcg per day.   ALLERGIES:  PROGRAF causes peripheral neuropathy.   FAMILY HISTORY:  Positive for stroke and hypertension.   SOCIAL HISTORY:  Patient lives with his wife.  No alcohol  and no tobacco.   REVIEW OF SYMPTOMS:  As noted in history of present illness plus no nausea,  vomiting, diarrhea.  No fever and no chills.  Makes just a little urine.  Slight cough, nonproductive, no shortness of breath, no paroxysmal nocturnal  dyspnea.  No recent change in any medications.  Memory losses seem to be  transient and nonspecific.   PHYSICAL EXAMINATION:  VITAL SIGNS:  Temperature 97.8, pulse 83,  respirations 22, blood pressure 226/98, 97% oxygen saturations on room air.  GENERAL:  Well-developed, fairly well-nourished, slender elderly man alert  and oriented X3.  HEENT:  Normocephalic, atraumatic. Pupils equal, round, reactive to light.  Oropharynx with no exudates.  NECK:  Supple. No jugular venous distention.  HEART:  Regular rate and rhythm with no rubs.  LUNGS:  Clear to auscultation bilaterally.  BACK:  Seborrheic keratosis.  ABDOMEN:  Positive bowel sounds, soft, nontender, nondistended.  EXTREMITIES:  Lower extremities sensation diminished bilaterally.  No ulcer  on feet. No cyanosis.  No lower extremity edema.  Left lower arm AV fistula,  positive thrill.  SKIN:  Warm and dry, multiple skin cancer  scars and actinic keratoses.   LABORATORY DATA:  Pending.  Patient is scheduled for MRI and MRA.   IMPRESSION:  1. Malignant hypertension.  2. Headache X1 week.  3. End stage renal disease.  4. Thrombocytopenia, chronic.  5. History of atrial fibrillation.   PLAN:  1. MRI and MRA.  Blood pressure control.  Patient is currently on diltiazem     only takes on non-dialysis days.  He probably needs his dry weight     lowered because he is leaping below his dry weights and additionally he     may need blood pressure control daily if nothing is found on MRI or MRA     to explain this.  2. Suspect headache will improve once blood pressure is lowered.  He is on     PRN clonidine to get his blood pressure down right now as well as Vicodin     for pain.  3. End stage renal disease:  Will dialyze tomorrow morning per his usual     schedule and increase volume removal to try to lower blood pressure that     way.  4. Idiopathic thrombocytopenia:  Last platelet count Jun 06, 2003 at kidney     center were 81,000 which is good for     him.  CBC is pending.  5. History of atrial fibrillation:  He is currently on diltiazem which he     does not take every day.  His electrocardiogram is sinus rhythm with     PAC's.  Will place him on telemetry to monitor his heart rate.      Weston Settle, P.A.                      Mindi Slicker. Lowell Guitar, M.D.    MB/MEDQ  D:  06/16/2003  T:  06/17/2003  Job:  [id number]   cc:   Pierce Crane, M.D.  [address] [facility]  [place]  [place] [address]  Fax: [phone]   Genene Churn. Love, M.D.  [address]  [address]  [place]  [place] [address]  Fax: [phone]   [facility]

## 2010-06-15 NOTE — H&P (Signed)
[facility]  Patient:    [name], [name]                       MRN: [id number] Adm. Date:  [id number] Attending:  Dayle Points Dictator:   Amada Jupiter, P.A. CC:         Hedwig Morton. Juanda Chance, M.D. [facility]  Dr. Rennis Chris, [facility] Nephrology Department   History and Physical  REASON FOR ADMISSION:  Profound anemia.  HISTORY OF PRESENT ILLNESS:  The patient is a 77 year old white male with end-stage renal disease secondary to chronic glomerulonephritis who is status post living-related-donor transplant on January 20, 1997 at [facility]. He is now admitted with bloody diarrhea on two separate episodes on June 21 and June 28 and a hemoglobin of 7.7 today, obtained at our office.  The patient was transfused the first week of June when his hemoglobin was in the 7 range.  It rose to 9.  His anemia was attributed to heme positive stools associated with CMV colitis.  He has been treated with Valcyte since June 10 and followed by Dr. Darlina Sicilian of infectious disease here at [facility]. Although still with watery diarrhea, he feels remarkably better than he did earlier in the month, with more energy, stronger, and with a better appetite. His creatinine prior the diagnosis of CMV had risen up to 4, and is currently down in the low 3 range.  He reports having bloody diarrhea, turning the toilet bowl water to cranberry-juice appearance.  This happened on two isolated incidents on the dates mentioned above.  Today, he came in for routine bloodwork at [facility] office and hemoglobin returned 7.7.  He is being admitted for transfusion and possible GI evaluation.  ALLERGIES:  No known drug allergies.  CURRENT MEDICATIONS:  Valcyte 450 mg, which he was taking every other day since June 10, currently with two pills left, which he is supposed to take twice this week.  Demodex 20 mg q.a.m.  CellCept 1000 mg b.i.d.  Prednisone 10 mg  q.a.m.  Prevacid 30 mg q.d.  Rocaltrol 0.5 mcg q.d.  Zocor 20 mg with supper.  Labetalol 100 mg b.i.d.  Plendil 10 mg b.i.d.  Colchicine 0.6 mg q.d. Sodium bicarb 650 mg b.i.d.  Epo 20,000 units subcutaneously every Tuesday and Friday.  PAST MEDICAL HISTORY: 1. End-stage renal disease secondary to chronic glomerulonephritis status post    living-related donor in December 1998 at [facility]. 2. Hypertension. 3. Hyperlipidemia. 4. Diverticular disease and peptic ulcer disease followed by Dr. Lina Sar. 5. Benign prostatic hypertrophy.  6. Anemia of chronic disease on Epo.  7. History of cytomegalovirus infections in the past followed by infectious     disease of [facility] on and off Acyclovir.  8. Secondary hypoparathyroidism.  9. Mild renal tubular acidosis. 10. Gout. 11. History of prostatitis. 12. Depression. 13. Peripheral neuropathy from Prograf. 14. CMV colitis.  FAMILY HISTORY:  Positive for strokes in his 54 year old brother and his deceased father, who died at the age of 5 from a lethal stroke.  Mother alive at 52 with high blood pressure.  No history of renal failure in family members.  Positive for hepatitis C in one son.  SOCIAL HISTORY:  The patient is married and has three children.  He is retired from [employer] in [place].  He is a nonsmoker and nondrinker.  REVIEW OF SYSTEMS:  Positive for poor appetite, 20-pound weight  loss in the past two months.  General fatigue, which is improving with CMV colitis treatment.  Denies nausea, vomiting, hematemesis, chest pain, shortness of breath, cough, dysuria, allograft tenderness, fever or chills.  PHYSICAL EXAMINATION:  VITAL SIGNS:  Blood pressure 138/64.  The patient is afebrile.  Respirations 18, pulse 68 and regular.  GENERAL:  Well-developed, well-nourished white male who is in no acute distress.  He is pleasant and cooperative.  SKIN:  Pale, warm and dry.  HEENT:  Normocephalic and  atraumatic.  Sclerae anicteric.  NECK:  Supple.  No JVD ling flat.  LUNGS:  Rare crackles at bases bilaterally.  HEART:  Regular rate and rhythm.  Grade 2/6 systolic murmur in the right upper border.  ABDOMEN:  Positive bowel sounds.  Soft and nontender.  Nondistended. Allograft is in the right lower quadrant and is nontender.  EXTREMITIES:  There is 2+ pitting edema pretibial.  LABORATORY DATA:  Hemoglobin 7.5, white count 3200, platelets 191,000.  Sodium 140, potassium 3.9, chloride 113, CO2 22, glucose 88, BUN 69, creatinine 3.2, calcium 8.2, albumen 3.5, total protein 5.5.  LFTs within normal limits. Phosphorous 4.0.  ASSESSMENT AND PLAN: 1. Known cytomegalovirus colitis status post therapy with Valcyte, nwo with    bloody diarrhea x 2.  Will admit.  Culture stools.  Rule out C. difficile    bacterial and parasitic overgrowth.  Obtain GI consult. 2. Anemia secondary to #1.  Transfuse.  Continue Prevacid and follow CBC.    Continue Epo. 3. End-stage renal disease status post living-related donor transplant in    1998.  Continue immunosuppressants.  Increase Demodex to 40 and give Lasix    between transfusions for mild volume excess. 4. Secondary hyperparathyroidism on Rocaltrol. DD:  07/26/00 TD:  07/26/00 Job: [id number]

## 2010-06-15 NOTE — Discharge Summary (Signed)
NAME:  [name], [name]                          ACCOUNT NO.:  [id number]   MEDICAL RECORD NO.:  [id number]                   PATIENT TYPE:  INP   LOCATION:  2905                                 FACILITY:  [facility]   PHYSICIAN:  Maree Krabbe, M.D.             DATE OF BIRTH:  1926-07-29   DATE OF ADMISSION:  02/26/2003  DATE OF DISCHARGE:  03/02/2003                                 DISCHARGE SUMMARY   DISCHARGE DIAGNOSES:  1. Right lower lobe pneumonia.  2. Pericarditis.  3. End-stage renal disease.  4. Atrial fibrillation.  5. Anemia.  6. Hyperlipidemia.  7. Benign prostatic hypertrophy.  8. Status post failed renal transplant December 1998 with a transplant     nephrectomy in 2004.  9. History of CMV colitis.  10.      History of Guillain-Barre syndrome in 2003.  11.      Hypothyroidism.   DISCHARGE MEDICATIONS:  1. Nephro-Vite 1 tablet p.o. daily.  2. Protonix 40 mg p.o. daily.  3. Colchicine 0.6 mg p.o. daily.  4. Synthroid 75 mcg 1 tablet p.o. daily.  5. Neurontin 200 mg two 100 mg tablets p.o. q.h.s.  6. Calcium carbonate 500 mg 1 tablet three times a day with meals.  7. Diltiazem 180 mg one tablet p.o. daily.  8. Restoril 30 mg p.o. q.h.s. p.r.n. sleep.  9. Avelox 400 mg p.o. daily x2 days to finish a seven day course.   ADMISSION HISTORY:  This is a 77 year old white male with end-stage renal  disease who presented to the [facility] Emergency Room with pleuritic right-  sided chest pain, productive cough, fevers, and chills for less than 24  hours. Symptoms have been slowly progressing over the past few days, but got  worse today. He had been dialyzed the previous day without any difficulty.  Denies any shortness of breath. He does have a productive cough of yellow  sputum.   HOSPITAL COURSE:  1. Pneumonia.  The patient was admitted with the history as outlined above.     Initially he was febrile to 101.2. He was with rales at the right base,     otherwise clear.  Chest x-ray showed a streaky right lower lobe infiltrate     on anterior and lateral films and no CHF. He was admitted and started on     IV Avelox. Blood cultures were obtained. While on the Avelox [name]     continued to improve each day, but persisted with some pleuritic chest     pain. Consequently cardiology was consulted. The serial cardiac enzymes     were relatively unimpressive. In the meantime he was begun on empiric     Indocin therapy for probable pericarditis. Serial EKGs did reveal a small     degree of inferolateral ST elevation consistent with an acute infarct     versus pericarditis. Due to the nature of [name] chest  pain it was     treated as pericarditis and continued to improve. He did not have any     chest pain on the last two days of his hospitalization. He completed a     four day course of indomethacin therapy.  2. Pericarditis. As listed in problem #1.  3. End-stage renal disease.  [name] potassium was 6.2 on admission for     which he received and extra hemodialysis. However, after that time he was     maintained on his normal Monday, Wednesday, Friday hemodialysis schedule     without any changes.  4. Atrial fibrillation. On the evening of January 30 [name] was noted to     have atrial fibrillation with a heart rate of 150. Cardizem bolus and     drip were instituted, which resulted in quick resolution of his atrial     fibrillation. He was later started with p.o. Cardizem as indicated in the     medication section. He maintained in normal sinus rhythm throughout the     rest of his stay. This was attributed likely due to the stress of the     pneumonia and pericarditis. There were no further arrhythmias during his     hospital stay.   PERTINENT STUDIES:  An echocardiogram on March 01, 2003 revealed an EF of  55-65%, mild LVH, and mild aortic valve stenosis.   CONSULTATIONS:  Dr. Clarene Duke from Cardiology.   DIET:  The patient is to continue his  renal diet while at home.   DISCHARGE FOLLOW UP:  1. The patient is to return to the hospital for increasing fevers,     productive cough, shortness of breath, or for other concerns.  2. Follow-up with Dr. Clarene Duke Tuesday, March 15, 2003 at 3:15 p.m.  3. Hemodialysis on Friday at 6 a.m. in the [place] location.      Rodolph Bong, M.D.                        Maree Krabbe, M.D.    AK/MEDQ  D:  03/02/2003  T:  03/02/2003  Job:  [id number]   cc:   Maree Krabbe, M.D.  Fax: [phone]   Thereasa Solo. Little, M.D.  [address]  [address]  [place]  [place] [address]  Fax: [phone]   [facility]  Fax:  [phone]

## 2010-06-15 NOTE — Consult Note (Signed)
[facility]  Patient:    [name], [name] Visit Number: [id number] MRN: [id number]          Service Type: MED Location: [phone] Attending Physician:  Lurlean Nanny Dictated by:   Lonna Cobb, N.P. Proc. Date: 02/04/01 Admit Date:  01/29/2001   CC:         Dr. Darrick Penna   Consultation Report  DATE OF BIRTH:  September 10, 1926  REASON FOR CONSULT:  Thrombocytopenia.  HISTORY OF PRESENT ILLNESS:  [name] is a 77 year old man who is status post renal transplant in 1998 followed by Dr. Donnie Coffin for a history of leukopenia and thrombocytopenia.  The patient underwent bone marrow aspirate and biopsy on January 27, 2001 which showed slight ______ hyperplasia with adequate numbers of megacaryocytes ([id number]).  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  Admission CBC showed a hemoglobin 11.5, white blood cell count 7.2, platelet count 42,000.  Platelet count on February 03, 2001 was 28,000.  Colchicine and CellCept were placed on hold on January 30, 2001.  His hemoglobin and white blood cell counts have remained stable.  PAST MEDICAL HISTORY:  1. Status post renal transplant 1998.  2. History of leukopenia.  3. Anemia (on EPO).  4. Thrombocytopenia (November 2002 platelet count approximately 110,000,     December 2002 platelet count approximately 55,000).  5. Status post bone marrow aspirate and biopsy January 27, 2001.  6. Hypertension.  7. Gout.  8. Hyperlipidemia.  9. Peripheral neuropathy. 10. Diverticular disease. 11. Hiatal hernia. 12. Internal hemorrhoids status post banding x4 December 26, 2000. 13. History of CMV colitis. 14. BPH. 15. Hypothyroid (new diagnosis).  MEDICATIONS: 1. Procrit 20,000 units weekly. 2. Plendil 10 mg b.i.d. 3. Labetalol 100 mg daily. 4. Synthroid 50 mcg daily. 5.  Protonix 40 mg daily. 6. Prednisone 10 mg daily. 7. Zocor 20 mg daily. 8. Demadex 60 mg daily (colchicine and CellCept discontinued January 30, 2001).  ALLERGIES:  No known drug allergies.  FAMILY HISTORY:  Father deceased with a CVA.  Mother is living.  Brother living with history of a stroke.  Sister healthy.  SOCIAL HISTORY:  Mr. Battershell lives in [place] with his wife.  They have three children who are all healthy.  The patient is originally [name]  He was employed with [employer] in the past.  He does have a positive tobacco history stating that he quit smoking approximately 40 years ago.  REVIEW OF SYSTEMS:  Patient denies any weight loss or anorexia.  His energy level has been variable.  He denies any pain.  He has had no fever.  He denies any shortness of breath.  He has had a cough.  He denies any gum bleeding or epistaxis.  He denies any chest pain.  He has had no peripheral edema.  He denies any change in his bowel habits and has had no rectal bleeding.  He denies any hematuria or dysuria.  He reports increasing upper and lower extremity weakness/numbness.  He has been unable to ambulate.  He has noticed increased bruising over the past two months.  PHYSICAL EXAMINATION  VITAL SIGNS:  Temperature 98.2, heart rate 73, respirations 20, blood pressure 145/62, oxygen saturation 96% on room air.  GENERAL:  Chronically ill appearing elderly white male in no acute distress.  HEENT:  Dime sized crusted right scalp lesion (status  post excision of "skin cancer").  Pupils are equal, round and reactive to light.  Extraocular movements are intact.  Sclerae:  Anicteric.  Oropharynx is clear.  No gum bleeding noted.  LYMPH:  No palpable cervical, supraclavicular, or axillary lymph nodes.  CHEST:  Breath sounds diminished left lower one-half.  CARDIOVASCULAR:  Regular rate and rhythm.  A 2/6 systolic ejection murmur.  ABDOMEN:  Soft and  nontender.  EXTREMITIES:  No edema.  Ecchymoses over upper extremities.  Lower extremities with ted hose.  NEUROLOGIC:  Alert and oriented x3.  Able to move all extremities.  Strength is decreased globally, greatest in the upper extremities.  LABORATORIES:  Hemoglobin 13.4, white count 6.5, platelets 28,000.  Sodium 144, potassium 4.6, BUN 93, creatinine 3.8, glucose 116, albumin 4.1, calcium 10.6, phosphorous 7.  CSF:  Total protein 92 (normal 15-45), glucose 54 (normal 43-76).  Admission laboratories:  Hemoglobin 11.5, white count 7.2, platelets 42,000.  Sodium 135, potassium 4.2, BUN 111, creatinine 3.4, glucose 151, alkaline phosphatase 83, SGOT 29, SGPT 20, total protein 6.1, albumin 3.3, calcium 8.2.  MRI of the C spine:  Cervical degenerative changes, central disk herniation at C3-4.  MRI of the T spine:  Mild disk degeneration.  MRI of the L spine: Degenerative disk disease, mild spinal stenosis L3-4 and L4-5.  IMPRESSION AND PLAN:  [name] is a 77 year old gentleman status post renal transplant 1998, history of anemia, leukopenia, and thrombocytopenia who was admitted with demyelinating polyneuropathy currently receiving plasmapheresis. The patient has been previously evaluated for the leukopenia and thrombocytopenia by Dr. Pierce Crane.  Since December 2000 his platelets have been declining while his white blood cell count has been relatively normal. Bone marrow biopsy from January 27, 2001 was unremarkable.  Differential diagnosis includes autoimmune/drug related thrombocytopenia. This is less likely to be TTP/HUS.  We will review a peripheral blood smear and check an LDH.  Plasmapheresis can cause some degree of platelet consumption.  If necessary, can given single donor platelets and check a one hour post transfusion platelet count.  Would certainly consider platelet transfusion for a platelet count of less than 10,000.  Recommend increasing his prednisone dose to 60  mg per day.   We will continue to follow with you.  Patient seen and examined by Dr. Donnie Coffin.  Laboratories were reviewed. Dictated by:   Lonna Cobb, N.P. Attending Physician:  Lurlean Nanny DD:  02/06/01 TD:  02/07/01 Job: [id number]

## 2010-06-15 NOTE — Procedures (Signed)
[place]. [facility]  Patient:    [name], [name]                       MRN: [id number] Proc. Date: 06/07/99 Adm. Date:  [id number] Attending:  Lurlean Nanny CC:         Llana Aliment. Deterding, M.D.             Hedwig Morton. Juanda Chance, M.D. [facility]             Malcolm T. Pleas Koch., M.D. [facility]                           Procedure Report  PROCEDURE:  Colonoscopy with multiple biopsies.  ENDOSCOPIST:  Venita Lick. Pleas Koch., M.D. [facility]  REFERRING PHYSICIAN:  Llana Aliment. Deterding, M.D.  INDICATIONS:  An 77 year old white male, status post renal transplant, maintained on CellCept and prednisone, who developed severe diarrhea, weight loss, and small volume hematochezia.  PHYSICAL EXAMINATION:  CHEST:  Clear to auscultation.  CARDIAC:  Regular rate and rhythm without murmurs.  NEUROLOGIC:  Alert and oriented x 3.  ANESTHESIA:  Fentanyl 80 mg IV, Versed 8 mg IV.  MONITORING:  Automated blood pressure monitor, pulse oximeter, and cardiac monitor.  Low-flow oxygen was given by nasal cannula throughout the procedure. COMPLICATIONS:  The procedure was well tolerated with no immediate complications.  DESCRIPTION OF PROCEDURE:  After the nature of the procedure was discussed with the patient including a discussion of the risks, benefits, and alternatives, he consented to proceed.  He was then comfortably sedated in the left lateral decubitus position.  Digital rectal examination revealed external hemorrhoids and no internal lesions.  The anal canal was mildly tender.  The Olympus pediatric video colonoscope was inserted into the rectal vault.  Air was insufflated, and the colonoscope was advanced to the level of the transverse colon.  There was significant looping of the colonoscope in the sigmoid colon.  Due to patient discomfort and marked colitis noted on the examination, I discontinued the exam at the level of the hepatic flexure. There was marked inflammation,  erythema, friability, erosions, and a complete loss of the vascular pattern throughout the transverse colon.  Throughout the splenic flexure, descending colon, sigmoid colon, and rectum, there was more patchy involvement of a similar-appearing colitis.  Diverticulosis was also noted in the sigmoid colon.  Multiple biopsies were obtained in the transverse colon and in the sigmoid colon.  Small internal hemorrhoids were noted on retroflexed view of the distal rectum.  The colon was decompressed.  The colonoscope was withdrawn from the patient.  IMPRESSION: 1. Colonoscopy to the hepatic flexure. 2. Patchy and nonspecific appearing colitis - more severe in the transverse    colon. 3. Diverticulosis. 4. Small internal hemorrhoids. 5. External hemorrhoids.  RECOMMENDATIONS: 1. Await biopsies. 2. Begin empiric Colazol 750 mg 2 p.o. b.i.d. and continue Flagyl for now. 3. Rectal and hemorrhoidal care instructions and medications. 4. Further plans pending biopsies. DD:  06/07/99 TD:  06/09/99 Job: [id number]

## 2010-06-15 NOTE — Procedures (Signed)
[facility]  Patient:    [name], [name] Visit Number: [id number] MRN: [id number]          Service Type: END Location: ENDO Attending Physician:  Mervin Hack Dictated by:   Hedwig Morton. Juanda Chance, M.D. [facility] Proc. Date: 11/25/00 Admit Date:  11/25/2000   CC:         Fayrene Fearing L. Deterding, M.D.   Procedure Report  PROCEDURE:   Flexible sigmoidoscopy with destruction of internal hemorrhoids.  ENDOSCOPIST:  Hedwig Morton. Juanda Chance, M.D.  INDICATIONS:  This 77 year old gentleman has chronic renal failure and had significant lower GI bleed secondary to hemorrhoids.  This occurred on several occasions requiring blood transfusions in July 2002.  The patient also has a history of CMV colitis which has been currently under control.  Because of his gout, he has been on colchicine 0.6 mg which has intermittently given him diarrhea.  Because of the significant amount of bleeding associated with his hemorrhoids and the failure for the hemorrhoids to respond to conservative measures, he is undergoing attempt at banding at the internal hemorrhoids.  ENDOSCOPE:  Olympus single channel videoscope.  SEDATION:  Versed 5 mg IV, fentanyl 100 mcg IV.  DESCRIPTION OF PROCEDURE: The Olympus single channel videoscope was passed directly into the rectum to the sigmoid colon.  The patient was monitored by the pulse oximeter.  His oxygen saturation was normal. Prep was excellent. Rectal tone was somewhat increased with some irregular anal mucosa and rubbery tissue with some skin tags internally.  On retroflexion, there were first grade hemorrhoids with some hypertrophied papilla at the dentate line and old thrombosed hemorrhoids.  The mucosa of the sigmoid colon was somewhat hypervascular but there was no acute colitis.  Exam was carried up to 40 cm. The endoscope was then retracted back to the rectum, retroflexed and four hemorrhoids were banded using standard bent ligator by  [place].  There was as small amount of bleeding from the banded hemorrhoids.  The patient tolerated the procedure well and had minimal discomfort.  IMPRESSION: First grade internal hemorrhoids status post banding x 4.  PLAN: 1. Anusol HC suppository q.d. 2. Lindamantle cream apply topically to the rectum b.i.d. 3. Office visit in one week. Dictated by:   Hedwig Morton. Juanda Chance, M.D. [facility] Attending Physician:  Mervin Hack DD:  11/25/00 TD:  11/25/00 Job: [id number]

## 2010-06-15 NOTE — Procedures (Signed)
[place]. [facility]  Patient:    [name], [name] Visit Number: [id number] MRN: [id number]          Service Type: MED Location: [id number] Attending Physician:  Lurlean Nanny Dictated by:   Genene Churn. Love, M.D. Proc. Date: 01/29/01 Admit Date:  01/29/2001                             Procedure Report  DATE OF BIRTH:  27-Oct-1926.  DESCRIPTION OF PROCEDURE:  This patient was prepped and draped in the left lateral decubitus position.  The L4-5 interspace was entered without difficulty.  Opening pressure and CSF with 70 mmH2O and clear-colored CSF was obtained and sent, for two tubes to be held, protein, glucose, cell count, differential, and CMV PCR.  The patient tolerated the procedure well. Dictated by:   Genene Churn. Love, M.D. Attending Physician:  Lurlean Nanny DD:  01/29/01 TD:  01/30/01 Job: [id number]

## 2010-06-15 NOTE — Consult Note (Signed)
NAME:  [name], [name] NO.:  [id number]   MEDICAL RECORD NO.:  [id number]          PATIENT TYPE:  INP   LOCATION:  1823                         FACILITY:  [facility]   PHYSICIAN:  Aram Beecham B. Eliott Nine, M.D.DATE OF BIRTH:  Nov 28, 1926   DATE OF CONSULTATION:  DATE OF DISCHARGE:                                   CONSULTATION   REFERRING PHYSICIAN:  Dr. Lesia Sago.   PRIMARY TEAM:  Dr. Anne Hahn, Neurology.   REASON FOR CONSULTATION:  The patient was admitted on the 8th of November  and consulted for end-stage renal disease.   HISTORY OF PRESENT ILLNESS:  The patient is a 77 year old white male with  extensive past medical history including status post failed right transplant  kidney, hemodialysis dependent, hypertension, who was brought to the ER via  EMS after the patient got acutely confused, agitated and aphasic at the end  of hemodialysis at [facility].  There was no complaint of neck pain,  weakness, blindness, fever, runny nose, palpitation, chest pain at the time  of presentation.  The patient complained of headache currently which had  started after the dialysis.  The patient's blood pressure at the end of  dialysis was 89/32 and went up to 113/72.  His CBG at that time was 121.  At  the time of interview, the patient was moving all his limbs and talking at  baseline.  Per wife, at baseline the patient is completely functional, is  able to do his daily activities and is cognitively intact.   PAST MEDICAL HISTORY:  1.  End-stage renal disease, status post renal transplant in 1998 at [facility], status post transplant nephrectomy in March 2004.  2.  Hypertension.  3.  CMV colitis, status post colonoscopy by Dr. Russella Dar in 2001.  4.  Status post EGD by Dr. Juanda Chance in 2001.  5.  History of BPH.  6.  History of erectile dysfunction.  7.  History of ITP, status post bone marrow biopsy in 2002 and 2003.  8.  History of Guillain-Barre syndrome, status post  plasmapheresis in      January of 2003 and 2006, Dr. Sandria Manly following.  9.  History of diverticulosis, colon polyps, internal hemorrhoids, status      post banding in November 2002.  10. History of hypothyroidism.  11. History of depression.  12. History of peripheral neuropathy secondary to Prograf.  13. History of PE in March of 2004, status post Coumadin treatment for 6      months.  14. History of right lower lobe pneumonia.  15. History of pericarditis.  16. History of transient atrial fibrillation in 2005.  Last 2-D echo as of      January 2005 showed ejection fraction of 55%, mild AS and AR.  17. History of anemia, on Epogen treatment, 2000 units 3 times every week      with hemodialysis.  18. History of secondary hyperparathyroidism due to renal disease, patient      on Zemplar 1 mcg every week.  19. History of gout.  20. History  of CMV encephalitis.  21. History of TIA in May of 2006.   MEDICINES AT HOME:  1.  Synthroid 75 mcg daily.  2.  Restoril 15 mg nightly.  3.  Megace 80 mg twice daily.  4.  Colchicine 0.6 mg every day.  5.  Nephro-Vite one tablet every day.  6.  Testosterone gel used in the last 2 weeks.   ALLERGIES:  No known allergies.   FAMILY HISTORY:  The patient's mother is alive at 36.  Father passed away  from stroke.  Brother is alive, status post stroke.  Has 3 children; 1 has  diagnosis of hepatitis C; the other son has been recently diagnosed with  polycythemia vera.   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   PHYSICAL EXAMINATION:  VITAL SIGNS:  Temperature 98.0, pulse 98, respiratory  rate of 22, blood pressure 138/75, saturating 97% on room air.  GENERAL:  In no acute distress.  The patient is holding his head and  complaining of being cold.  HEENT:  Atraumatic.  Normal oropharynx mucosa.   Pupils:  Right 3 mm,  reactive; left 4 mm, sluggishly reactive.  NECK:  No neck rigidity.  Neck supple.  No bruit or thyromegaly.  RESPIRATORY:  Clear to auscultation bilaterally.  CARDIOVASCULAR:  Regular rate and rhythm, S1 and S2 normal.  ABDOMEN:  Upper abdomen soft and nontender.  Bowel sounds present.  Right  __________ quadrant scar present.  CNS:  Oriented to person, but not to time and place.  Cranial nerves II-XII  intact.  Motor strength 5/5 in all extremities.  Tone normal.  Deep tendon  reflexes 2 and symmetric all over.  Babinski downgoing.  Sensation decreased  in lower extremities.  Speech normal.  SKIN:  Cyanotic toes and tips of fingers.  EXTREMITIES:  No edema, +2 pulses.   LABORATORY DATA:  I-STAT:  Sodium 137, potassium 3.8, chloride 103, bicarb  33, BUN 18, glucose 84.  Hemoglobin 13.3, hematocrit 39.0.  PTT 26 seconds.  First set of cardiac markers showed myoglobin of more than 500, CK-MB 1.3,  troponin less than 0.05.   IMAGING STUDIES:  CT of head showed no acute abnormality, generalized  atrophy with chronic small vessel disease.   ASSESSMENT AND PLAN:  1.  End-stage renal disease:  The patient is hemodialysis dependent.  We      will follow him while he is in the hospital.  We will arrange for      hemodialysis on Friday; please see orders for complete detail.  We will      also continue Epogen and Zemplar during hemodialysis.  2.  Altered mental sensorium:  Differential includes transient ischemic      attack versus secondary to hypertension versus arrhythmia versus      cerebrovascular accident, ischemic, needs MRI to rule it out.  Dr.      Anne Hahn is the primary.  The patient is on aspirin as an antiplatelet      agent currently.  The patient is also to get swallowing evaluation for      his temporary aphasia.  3.  Hypertension:  Stable currently.  4.  Hypothyroidism:  Continue Synthroid 75 mcg daily. 5.  Gout:  Continue colchicine daily.  We will hold  Restoril tonight as it  might could his presentation.   DISPOSITION:  We will continue to follow with primary.      Artist Beach, MD    ______________________________  Duke Salvia Eliott Nine, M.D.    SP/MEDQ  D:  12/05/2004  T:  12/06/2004  Job:  [id number]

## 2010-06-15 NOTE — Discharge Summary (Signed)
NAME:  [name], [name]                          ACCOUNT NO.:  [id number]   MEDICAL RECORD NO.:  [id number]                   PATIENT TYPE:  INP   LOCATION:  5533                                 FACILITY:  [facility]   PHYSICIAN:  Mindi Slicker. Lowell Guitar, M.D.               DATE OF BIRTH:  06-08-1926   DATE OF ADMISSION:  06/16/2003  DATE OF DISCHARGE:  06/30/2003                                 DISCHARGE SUMMARY   ADMISSION DIAGNOSES:  1. Malignant hypertension.  2. Headache.  3. Hypothyroidism.  4. Anemia.  5. History of transplant nephrectomy in March 2003.  6. History of pulmonary embolus in March 2003, status post six month     Coumadin course.  7. History of pericarditis, right lower lobe pneumonia, and atrial     fibrillation in January 2003.  8. Benign prostatic hypertrophy.  9. History of Gillian-Barre, status post plasmapheresis in June 2003.  10.      History of idiopathic thrombocytopenic purpura followed by Dr.     Donnie Coffin.  11.      History of depression.  12.      Urinary tract infection secondary to Proteus.  13.      History of CMV colitis in 2001.  14.      History of hyperlipidemia.  15.      History of gout.  16.      End-stage renal disease requiring hemodialysis since June 2003.   DISCHARGE DIAGNOSES:  1. CMV encephalitis.  2. Heme positive stool x1.  3. Hypothyroidism.  4. Anemia.  5. History of transplant nephrectomy in March 2003.  6. History of pulmonary embolus in March 2003, status post six month     Coumadin course.  7. History of pericarditis, right lower lobe pneumonia, and atrial     fibrillation in January 2003.  8. Benign prostatic hypertrophy.  9. History of Gillian-Barre, status post plasmapheresis in June 2003.  10.      History of idiopathic thrombocytopenic purpura followed by Dr.     Donnie Coffin.  11.      History of depression.  12.      Urinary tract infection secondary to Proteus.  13.      History of CMV colitis in 2001.  14.      History of  hyperlipidemia.  15.      History of gout.  16.      End-stage renal disease requiring hemodialysis since June 2003.   CONSULTATIONS:  1. Dr. Avie Echevaria on Jun 17, 2003.  2. Dr. Cliffton Asters on Jun 20, 2003.   PROCEDURES:  1. Hemodialysis.  2. Bilateral carotid ultrasound on June 29, 2003.  3. MRA head/MR brain on Jun 16, 2003.  4. Plain films of left elbow on Jun 23, 2003.  5. Plain film of abdomen on Jun 24, 2003.   HISTORY:  [name], a 77 year old gentleman, was  sent to [facility] on the day of admission after Dr. Lowell Guitar received a phone call from  Dr. Donnie Coffin stating that his blood pressure in his office was found to be  around 220/100, with a persistent headache and lethargy.  Mr. Creekmore receives  hemodialysis three times a week at [facility].  Upon  presentation in the emergency room, the patient's temperature was 97.8,  pulse 83, respirations 22, blood pressure 226/98, 97% O2 saturation on room  air.  The patient was alert and oriented x3.  Hemoglobin 13.7, hematocrit  41.1, platelets of 90,000.  Also upon presentation, his BUN was found to be  at 30, creatinine 10.4, albumin 3.7, calcium 9, CO2 35, sodium 138,  potassium 4.5, phosphorus 4.9, total bilirubin 1, ALP 82, ALT 11, AST 18.  TSH 1.776.  He was admitted for blood pressure control.   HOSPITAL COURSE:  #1 -  CONFUSION   :  Hypertensive encephalopathy was high  on differential.  Therefore, home dose of Diltiazem 180 mg p.o. on non-  hemodialysis days was increased to one tablet p.o. daily.  P.r.n. clonidine  and scheduled Norvasc were given.  MRA/MRA revealed an intracranial  occlusion or aneurysm;  however, revealed mild stenosis in right middle  cerebral artery branch.  Also revealed generalized atrophy and mild chronic  small vessel ischemia changes, but no acute abnormality.  Bilateral carotid  ultrasound revealed no evidence of significant ICA stenosis.  However, right   vertebral artery showed loss of diastolic flow.  Dr. Sandria Manly on Jun 17, 2003,  recommended an LP and EEG.  On going through the chart on day of dictation,  I am not able to find the results of the EEG, however, the LP revealed no  cells, but a moderately elevated protein and a slightly low glucose.  After  a few days, blood pressure was under good control;  however, delirium  continued.  Dr. Orvan Falconer recommended on Jun 20, 2003, 80 mg of ganciclovir  IV after dialysis for questionable herpes simplex versus CMV pending blood  work.  He required p.r.n. Haldol, Ativan, restraints, and a sitter.  By day  #3, of ganciclovir the patient was noted to be more interactive without  headache and improving appetite.  Interpretation of acute IgM antibody  elevation was difficult due to history of CMV colitis in 2001, and a  negative CMV PCR spoke against his illness being CMV encephalitis, but given  the patient's dramatic improvement coincident with starting ganciclovir, Dr.  Orvan Falconer felt compelled to continue it for a total of eight days.  In  anticipation of the patient's discharge, ganciclovir IV was changed to  Valcyte p.o. for which he received a total of four doses as recommended by  infectious disease.   Had swallowing evaluation due to signs and symptoms of aspiration was  recommended aspiration precautions, however.  By hospital day #8  (ganciclovir day #5) it was clear the patient needed Panda tube due to  continued dysphagia.  Tube feedings continued until hospital day #11, where  he required no aspiration precautions.  The patient was ready for discharge  on June 30, 2003, hospital day #13.  #2 -  DIARRHEA BEGAN ON HOSPITAL DAY #4:  Stool cultures were negative for  C. diff and other enteric pathogens.  Colchicine was held and subsequently  diarrhea resolved.  Stools were guiaced x1 and were positive, however, hemoglobin was stable throughout hospital at around 14.  Positive stool felt  secondary to diarrhea.  #3 -  END-STAGE RENAL DISEASE:  He received hemodialysis via left forearm AV  fistula with an average blood flow rate of 400.  Systolic blood pressures  were stable except for first treatment on Jun 17, 2003, day after admission.  Please see hospital course #1.  #4 -  HISTORY OF IDIOPATHIC THROMBOCYTOPENIC PURPURA:  According to history  and physical done by Weston Settle, P.A.-C., platelet count on Jun 06, 2003,  at [facility] was 81,000.  On day of admission, platelet count was found  to be at 90, and on day of discharge on June 29, 2003, platelets were at  125,000.   DISCHARGE MEDICATIONS:  1. Diltiazem 180 mg p.o. daily.  2. Colchicine 0.6 mg p.o. daily.  3. Synthroid 75 mcg p.o. daily.  4. Nephro-Vite one tablet p.o. daily.  5. Tums Ultra three with meals.  6. Protonix 40 mg p.o. daily.  7. Temazepam 30 mg 1/2 to whole tablet p.o. q.h.s.  8. Epogen 7500 units IV every treatment.  9. Valcyte 450 mg p.o. every other day, #2 refills as written on a     prescription given to wife.  10.      Tylenol 325 mg one to two tabs q.4-6h. p.r.n. pain.   ACTIVITY:  As tolerated.   DIET:  Renal, 80/2/2, 1200 cc fluid restrictive diet.   SPECIAL INSTRUCTIONS FOR HEMODIALYSIS CHARGE NURSE:  She understood to check  stools for blood using stool cards x3, and an in-center hemoglobin every  treatment x3.  Of note, on June 29, 2003, hemoglobin was 13.6.   FOLLOWUP:  Follow up with infectious disease as needed.  To continue regular  outpatient hemodialysis schedule at [facility].      Hillery Hunter, PA                   Mindi Slicker. Lowell Guitar, M.D.    MJG/MEDQ  D:  08/26/2003  T:  08/26/2003  Job:  [id number]   cc:   Genene Churn. Love, M.D.  [address]  [address]  [place]  [place] [address]  Fax: [phone]   Pierce Crane, M.D.  [address] [facility]  [place]  [place] [address]  Fax: [phone]   [facility]   [facility]

## 2010-06-15 NOTE — Discharge Summary (Signed)
NAME:  [name], COLLIGAN                ACCOUNT NO.:  [id number]   MEDICAL RECORD NO.:  [id number]          PATIENT TYPE:  INP   LOCATION:  3034                         FACILITY:  [facility]   PHYSICIAN:  Pramod P. Pearlean Brownie, MD    DATE OF BIRTH:  Oct 07, 1926   DATE OF ADMISSION:  12/05/2004  DATE OF DISCHARGE:  12/07/2004                                 DISCHARGE SUMMARY   DISCHARGE DIAGNOSES:  1.  Left brain transient ischemic attack.  2.  Hyperlipidemia.  3.  Hyperhomocysteinemia.  4.  End-stage renal disease, status post renal transplant 1998 at [facility], status post transplant nephrectomy in March 2004.  5.  Hypertension.  6.  CMV colitis, status post colonoscopy by Dr. Russella Dar in 2001.  7.  Status post esophagogastroduodenoscopy by Dr. Juanda Chance in 2001.  8.  History of benign prostatic hypertrophy.  9.  History of erectile dysfunction.  10. History of idiopathic thrombocytopenic purpura, status post bone marrow      biopsy in 2002 and 2003.  11. History of Guillain-Barre syndrome, status post plasmapheresis in      January 2003 and 2006.  Dr. Sandria Manly follows.  12. History of diverticulosis, colon polyps, internal hemorrhoids, status      post banding in November 2002.  13. History of hypothyroidism.  14. History of depression.  15. History of peripheral neuropathy secondary to Prograf.  16. History of pulmonary embolism in March 2004, status post Coumadin      treatment for 6 months.  17. History of right lower lobe pneumonia.  18. History of pericarditis.  19. History of transient atrial fibrillation in 2005.  Last 2-D      echocardiogram as of January 2005 showed ejection fraction of 55%, mild      CF and AR.  20. History of anemia on Epogen treatment 2000 units three times every week      with hemodialysis.  21. History of secondary hyperparathyroidism due to renal disease.  The      patient on Zemplar 1 mcg every week.  22. History of gout.  23. History of CMV encephalitis.  24. History of transient ischemic attack in May 2006.   DISCHARGE MEDICATIONS:  1.  Synthroid 75 mcg a day.  2.  Restoril 15 mg at bedtime.  3.  Megace 80 mg b.i.d.  4.  Colchicine 0.6 mg every day.  5.  Nephro-Vite 1 tablet every day.  6.  Testosterone gel used over the last 2 weeks.  7.  Zocor 20 mg a day.  8.  Aspirin 325 mg a day.  9.  Epogen three times a week at dialysis.   STUDIES PERFORMED:  1.  CT of the brain on admission.  No acute abnormality.  Generalized      atrophy with chronic small-vessel disease.  2.  MRI of the brain shows no acute infarct.  3.  Carotid Doppler shows no ICA stenosis.  Vertebral artery flow is      antegrade.  No significant change since study of June 29, 2003.  4.  2-D  echocardiogram performed, results pending.  5.  Transcranial Doppler performed, results pending.  6.  EKG shows normal sinus rhythm, nonspecific T-wave abnormalities,      prolonged QT.   LABORATORY STUDIES:  Sodium 139, potassium 4.5, chloride 95, CO2 30, BUN 42,  creatinine 10.7, glucose 86. Hemoglobin and hematocrit 11.4 and 32.8, white  bloods 4.8, and platelets 124. Homocystine is elevated at 16.9, cholesterol  180, triglycerides 151, HDL 25, and LDL 125.  Hemoglobin A1c was 5.2.  Myoglobin of 500, CK of 1.3, and troponin of less than 0.05.   HISTORY OF PRESENT ILLNESS:  Mr. Merlyn Conley is a 77 year old right-handed  white male with a history of end-stage renal disease on dialysis.  He has a  history of hypertension and arrives at [facility] via [facility] for evaluation of confusion.  The patient was apparently undergoing  hemodialysis the day of admission, had an episode of hypotension and was  noted to be somewhat confused after that.  The patient seemed to be a bit  agitated, moving all four extremities.  The patient was transferred to  [facility] where he underwent a CT scan of the head that was  unremarkable.  Onset of the patient's episode  was around 10:00 to 10:30 a.m.  The patient was subsequently transferred to [facility], and  neurology was called.  Repeat CT of the head at [facility] again  was unremarkable.  The patient appears to have a conductive type of aphasia  with a presumed brain cerebrovascular infarct.  The patient is being  admitted for evaluation of a stroke.   HOSPITAL COURSE:  MRI was unrevealing for acute stroke.  Complete stroke  workup was performed with results pending at time of discharge.  It is felt  the patient has a new left brain transient ischemic attack, possibly related  to vascular risk factors of hypertension, dyslipidemia, history of TIA.  For  elevated LDL, the patient was placed on Zocor 20 mg a day with goal LDL of  less than 100.  Blood pressure slightly elevated in the hospital in the 160s  to 170s with a goal systolic blood pressure of less than 130.  The patient  neurologically returned to baseline.  It was safe for him to return home  with his wife, and arrangements were made for followup with Dr. Pearlean Brownie after  discharge.   CONDITION ON DISCHARGE:  Neurologically intact.  Alert and oriented x3.  Speech clear.  Extraocular movements intact. Visual fields are full.  No arm  drift.  Strength normal.  Sensation intact.  Chest clear to auscultation.  Heart rate is regular.   DISCHARGE PLANS:  1.  Discharge home with wife.  2.  Aspirin added for stroke prevention.  3.  New Zocor.  Goal LDL less than 100.  4.  Dr. Pearlean Brownie to follow up as an outpatient.  Followup TCD and 2-D      echocardiogram results, which are pending at time of discharge.  5.  Follow up with renal as previously scheduled.      Annie Main, N.P.    ______________________________  Sunny Schlein. Pearlean Brownie, MD    SB/MEDQ  D:  12/07/2004  T:  12/08/2004  Job:  [id number]   cc:   Pramod P. Pearlean Brownie, MD  Fax: [phone]   D[facility] Salvia. Eliott Nine, M.D.  Fax: [phone]   Artist Beach, MD  Fax: [phone]

## 2010-06-15 NOTE — H&P (Signed)
NAME:  [name], DEROO NO.:  [id number]   MEDICAL RECORD NO.:  [id number]                   PATIENT TYPE:  INP   LOCATION:  5505                                 FACILITY:  [facility]   PHYSICIAN:  Maree Krabbe, M.D.             DATE OF BIRTH:  [birth year]   DATE OF ADMISSION:  02/26/2003  DATE OF DISCHARGE:                                HISTORY & PHYSICAL   CHIEF COMPLAINT:  Pneumonia.   HISTORY:  The patient is a 77 year old white male with end-stage renal  disease, dialyzes Monday, Wednesday, and Friday in [place].  He presented  to [facility] emergency room today with pleuritic right-sided chest pain,  productive cough, fevers, chills, and sweats of less than 24 hours duration.  The symptoms have been slowly progressing for the past few days but got  worse early this morning.  He dialyzed yesterday without difficulty.  He  denies any shortness of breath.  He does have a cough productive of yellow  sputum.   PAST MEDICAL HISTORY:  1. ESRD.  Dialyzed Monday, Wednesday, and Friday in [place].  Dry weight is     64.1 kg.  2. History of ITP.  Platelets have recently been over 100,000.  3. Peripheral neuropathy, suspected due to Prograf.  4. Anemia.  5. Hyperlipidemia.  6. BPH.  7. Depression.  8. Status post failed renal transplant in December, 1998 with transplant     nephrectomy in 2004.  9. History of CMV colitis.  10.      History of Guillain-Barr syndrome in 2003.  11.      Hypothyroidism.   FAMILY HISTORY:  Positive for stroke and high blood pressure.   REVIEW OF SYSTEMS:  GENERAL:  Denies weight loss.  HEENT:  He denies hearing  loss, visual change, sore throat, difficulty swallowing.  CARDIORESPIRATORY:  Denies hemoptysis, orthopnea, PND.  Otherwise as above.  GI:  He has been  vomiting two times today.  No hematemesis or abdominal pain.  GU:  He makes  very little urine.  MUSCULOSKELETAL:  Denies arthralgia, myalgia, or edema.  NEUROLOGIC:  He has chronic neuropathic symptoms of both feet.  No focal  weakness.  Prior history of Guillain-Barr syndrome, as noted above.   PHYSICAL EXAMINATION:  VITAL SIGNS:  Temperature 101.2, pulse 90,  respirations 18, blood pressure 130/80.  GENERAL:  This is a thin, alert elderly white male in no acute distress.  He  is well-developed, well-nourished, and responds appropriately.  HEENT:  PERRLA.  EOMI.  Throat is clear.  NECK:  Supple without JVD or meningismus.  LUNGS:  Rales at the right base, otherwise clear.  HEART:  Regular rate and rhythm without murmur, rub or gallop.  ABDOMEN:  Soft, nontender.  No organomegaly.  Active bowel sounds.  GENITOURINARY:  Within normal limits.  EXTREMITIES:  The feet are slightly cool distally in the toes, but there is  no gangrene or ulceration.  NEUROLOGIC:  Decreased sensation in bilateral feet.  Otherwise unremarkable.   LABORATORY:  Sodium 140, potassium 5.1, BUN 47, creatinine 9.4.  White blood  cell count 10,000, hemoglobin 11.9, platelets 126,000.  Albumin 3.6, INR  1.3.  Liver enzymes within normal limits.   CHEST X-RAY:  Streaky right lower lobe infiltrate seen on the anterior and  lateral fields.  No CHF.   IMPRESSION:  1. Right lower lobe pneumonia with fever, chills, nausea, vomiting, and     productive cough.  2. End-stage renal disease, dialyzed yesterday.  3. Peripheral neuropathy.  4. History of idiopathic thrombocytopenic purpura.  5. History of cytomegalovirus colitis.  6. History of Guillain-Barr.   PLAN:  1. Admit.  2. IV Avelox for pneumonia.  3. Follow up blood cultures.  4. Hemodialysis on Monday, Wednesday, and Friday.                                                Maree Krabbe, M.D.    RDS/MEDQ  D:  02/26/2003  T:  02/26/2003  Job:  [id number]

## 2010-06-15 NOTE — Discharge Summary (Signed)
NAME:  [name], [name] NO.:  [id number]   MEDICAL RECORD NO.:  [id number]          PATIENT TYPE:  INP   LOCATION:  5501                         FACILITY:  [facility]   PHYSICIAN:  Terrial Rhodes, M.D.DATE OF BIRTH:  12/13/1926   DATE OF ADMISSION:  05/02/2006  DATE OF DISCHARGE:  05/08/2006                               DISCHARGE SUMMARY   DISCHARGE DIAGNOSES:  1. Recurrent episodes of altered mental status with baseline dementia      thought to be secondary to seizure episodes.  2. End-stage renal disease with history of failed renal transplant, on      Monday, Wednesday and Friday hemodialysis at [facility].  3. Hypertension.  4. 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  5. Protein malnutrition.   DISCHARGE MEDICATIONS:  1. Aricept 5 mg p.o. at bedtime.  2. Nephro 237 mL by mouth b.i.d.  3. Aspirin 81 mg p.o. daily.  4  Zocor 20 mg p.o. at bedtime.  1. Nephro-Vite one p.o. daily.  2. Colchicine 0.6 mg p.o. daily.  3. Megace 40 mg  p.o. b.i.d.  4. Synthroid 75 mcg p.o. daily.  5. Dilantin 300 mg p.o. at bedtime.  6. Senna two or three tablets p.o. daily.  7. MiraLax 17 g by mouth daily.  8. Lisinopril 5 mg p.o. at bedtime.  9. Hemodialysis orders:  The patient to continue hemodialysis on      Monday, Wednesday and Friday at [facility].  Estimated      dry weight 63 kg, duration 3 hours 30 minutes.  Access left forearm      AV fistula.  Two potassium plus bath, tight heparin, EPO 4500 units      three times a week, vitamin D 1.5 mcg three times a week.   FOLLOW UP:  1. The patient is to have hemodialysis as scheduled at the [facility].2  2. The patient is to follow up with neurology, Dr. Anne Hahn, in six to      eight weeks, post discharge.  3. The patient needs Dilantin levels to be drawn in 10 days, namely      May 18, 2006, which can be done at the dialysis center.   OPERATION/PROCEDURE:  Hemodialysis x2.   CONSULTATIONS:  Neurology, Dr. Anne Hahn.   IMAGING DONE THIS ADMISSION:  None.   BRIEF HISTORY AND PHYSICAL:  Please see H&P for full details.   In brief, [name] is a 77 year old white man with baseline dementia,  expressive aphasia with end-stage renal disease on chronic hemodialysis,  hypertension, anemia of kidney disease, and other medical problems  presenting to the [facility] by [facility]  from [facility] in a state of altered mental status.  The patient  had a CT scan of the head and chest x-ray done at [facility]  which was essentially negative with no acute changes.  He was  transferred here for hemodialysis and further management of recurrent  episodes of altered mental status and placement issues.   PAST  MEDICAL HISTORY:  Please see history and physical dictated on May 02, 2006.   NEW MEDICATIONS ADDED THIS ADMISSION:  1. Dilantin 300 mg p.o. at bedtime.  2. Lisinopril 5 mg p.o. at bedtime.   IMPORTANT LABORATORY DATA:  Dilantin level drawn on May 07, 2006 within  normal limits.   HOSPITAL COURSE:  Problem #1.  Recurrent episodes of altered mental  status with baseline dementia.  Initial differential diagnosis including  worsening of dementia versus TIA/CVA versus electrolyte abnormalities  versus infection.  The infection was ruled out with negative cervix and  negative urinalysis as well.  The patient had a CT scan of the head done  at [facility].  Hence this was not repeated here.  MRI was also  not done as the patient did not have any focal neurological deficits.  The patient's electrolytes were within normal limits with hemodialysis.  Hence, this was not to be the cause.  Neurologic consult was called to  help with diagnosis and treatment.  Dr. Anne Hahn saw the patient and  started him empirically on Dilantin 99 mg p.o. at bedtime and scheduled  him for an outpatient  followup in six to eight weeks.  The patient is to  repeat Dilantin level in 10 to 14 days from discharge.  This can be done  at the hemodialysis center.  Of note, the patient's EEG was negative in  January and CT scan of the head was negative at the same time.   Problem #2. End-stage renal disease on chronic hemodialysis on Monday,  Wednesday and Friday at [facility].  The patient's dry  weight was decreased to 63 kg.  Duration was kept the same at 3-1/2  hours.  He was continued on Zemplar 1.5 mcg three times a weekly with  Epogen 4500 three times a week as well.  The patient is to go to a  skilled nursing facility is close to a [facility]; hence,  further dialysis also will be at their office.   Problem #3.  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  The patient is on Aricept as an  outpatient.  This was continued throughout the hospital stay.   Problem #4.  Agitation. The patient ad recurrent episodes of agitation  altered mental status and Ativan 1 to 2 mg were used q.8h. p.r.n.   Problem #5. Hypertension.  The patient was found to be hypertensive in  between as well as post dialysis. Hence, it was decided to start him on  an ACE inhibitor; namely lisinopril 5 mg p.o. at bedtime.  After  starting medications, the patient did not have any change in kidney  function but blood pressures improved significantly.   Problem #4.  Protein malnutrition as evidenced by low albumin levels.  A  calorie count may be considered at the skilled nursing facility.  The  patient is being given Nephro-Vite as well.   DISPOSITION:  The patient is being discharged to [facility].  The patient's wife is in agreement with having  her husband there.  He will receive hemodialysis at [facility] as stated above.  DISCHARGE VITAL SIGNS:  Afebrile.  Blood pressure 112/51, pulse 73,   respiratory rate 18.   DISCHARGE LABORATORY DATA:  (May 07, 2006).  Hemoglobin 12.8,  hematocrit 38.3, WBC 4.9, platelets 127.  Sodium 135, potassium 4.7,  chloride 97, bicarb 29, BUN 46, creatinine 10.28, glucose 108, albumin  3.5, calcium 9, phosphorus 5.      Yetta Barre, M.D.  Electronically Signed     ______________________________  Terrial Rhodes, M.D.    SS/MEDQ  D:  05/08/2006  T:  05/08/2006  Job:  [id number]

## 2010-06-15 NOTE — H&P (Signed)
[place]  Patient:    [name], [name] Visit Number: [id number] MRN: [id number]          Service Type: Attending:  Fayrene Fearing L. Deterding, M.D. Dictated by:   Sheffield Slider, P.A.C. Adm. Date:  01/29/01                           History and Physical  CHIEF COMPLAINT: Bilateral upper and lower extremity weakness.  HISTORY OF PRESENT ILLNESS: [name] is a pleasant 77 year old white male, with living related donor renal transplant since January 17, 1997.  He was never placed on hemodialysis and history of chronic rejection.  Most recent creatinines have been 4.4 on January 06, 2001 and 3.7 with a BUN of 116 on January 20, 2001.  He also has severe peripheral neuropathy related to Prograf and recurrent CMV.  The patient was seen on January 06, 2001 with leg, hand, foot, and periorbital numbness.  He was placed on Valcyte 450 mg every other day in case CMV was reactivated.  His culture was negative and Valcyte was discontinued.  Chemistries also showed a normal calcium of 8.7 with albumin of 3.5.  The patient recently stopped Rocaltrol on his own when he read that it caused symptoms like he has been having.  Today he presents for worsening numbness and tingling without pain and profound upper and lower extremity body weakness that has been progressive.  Since January 24, 2001 he has not been able to stand or walk without his knees giving out.  His home is on one level but he has to crawl down five steps to get out of the house.  He was seen in the office today and arrangements were made for admission and further neurological evaluation.  PAST MEDICAL HISTORY:  1. Gout.  Last uric acid was 12.6 on January 20, 2001.  2. Leukopenia, followed by Dr. Donnie Coffin.  The patient had a bone marrow done     January 27, 2001, results not received yet.  MRI of the brain January 27, 2001 showed no acute changes.  3. Hyperlipidemia.  4. Anemia.  5.  History of elevated parathyroid hormone.  His last was within normal     limits at 82.  He had been on Rocaltrol 0.5 mg b.i.d. but he discontinued     as described above.  6. History of prostatitis, status post TUR.  7. Peripheral neuropathy.  8. Diverticular disease.  9. Colon polyps. 10. Internal hemorrhoids, status post banding in November 2002 by Dr. Lina Sar. 11. History of RTA, on sodium bicarbonate at times; none at the present. 12. History of CMV and CMV colitis. 13. History of right upper pole of kidney renal artery stenosis.  REVIEW OF SYSTEMS: No headache.  No visual changes.  No neck stiffness. However, neck numbness.  Memory is good.  No swallowing problems.  Appetite is fair.  No hearing deficit.  No nausea, vomiting, diarrhea.  However, he does have constipation, he thinks probably secondary to limited mobility.  No change in urination.  Uses urinal secondary to mobility issues.  Unable to stand or walk without legs going out.  Has numbness and tingling from hands to near shoulders and from feet to near upper thighs, but no pain.  Positive generalized weakness with neck and facial tingling.  No shortness of breath. No chest pain.  No breathing difficulties at all.  Does have chronic cough.  FAMILY HISTORY: Father died of CVA at 31.  Mother age 90.  Positive family history of hypertension.  SOCIAL HISTORY: Remote tobacco.  No alcohol.  Supportive wife.  His son lives next door.  He has several other siblings.  MEDICATIONS:  1. CellCept 500 mg b.i.d.  2. Prednisone 10 mg q.d.  3. Prilosec 30 mg q.d.  4. Torasemide 60 mg q.d.  5. Rocaltrol 0.5 mcg b.i.d.  Patient discontinued by himself.  6. Zocor 20 mg q.d.  7. Labetalol 100 mg q.d.  8. Plendil 10 mg q.d.  9. Colchicine 0.6 mg q.d. 10. Epogen 20,000 units subcu per week.  PHYSICAL EXAMINATION:  VITAL SIGNS: Temperature 96.7 degrees, blood pressure 106/58, pulse 76, respirations 20.  Not able to weigh in  the office.  He says he weighed 137 pounds at Dr. Gita Kudo office on January 27, 2001.  GENERAL: Courteous, pleasant, chronically ill white male, sitting in wheelchair.  HEENT: Normocephalic, with two areas of recently removed actinic keratosis. PERRL.  TMs clear bilaterally.  Oropharynx, no exudate.  Tongue midline.  NECK: No lymphadenopathy.  No thyromegaly.  Full range of motion.  HEART: Regular rate and rhythm with 2/6 murmur.  LUNGS: Rales at left base.  ABDOMEN: Positive bowel sounds.  Soft, nontender, nondistended.  EXTREMITIES: Left AV fistula, positive thrill.  No upper or lower extremity edema.  Feet, slight rubor bilaterally.  Good dorsalis pedis pulses bilaterally.  NEUROLOGIC: Decreased DTRs throughout.  Decreased muscle strength bilaterally, 3/5 upper and lower.  No sensation from feet to upper thighs and hands to near shoulders.  Alert and oriented x 3.  Cranial nerves 2-12 intact.  Unable to stand without significant insistence and even then his knees would give out.  ASSESSMENT/PLAN:  1. Progressive ascending polyneuropathy, started early in December 2002,     unable to walk since January 24, 2001.  Plan - EMG nerve conduction     studies.  Neurology consul.  Telemetry.  2. Renal transplant with chronic rejection.  Follow laboratories.  Private     room.  Usual immunosuppressive medications.  3. History of peripheral neuropathy, severe, though felt not related to #1.  4. Hypertension.  Continue usual medications.  5. History of hyperparathyroidism, within normal limits now.  Will hold     Rocaltrol for the time being.  6. Anemia.  Last hemoglobin within normal limits.  Continue weekly Epogen.     Will give dose today.  He did not receive his dose earlier this week.     His last ______ was 29% on January 06, 2001.  7. Gout.  Continue Colchicine 0.6 mg q.d.  8. History of leukopenia, followed by Dr. Donnie Coffin.  Follow-up blood,     marrow results.  Of note was  that most recent CBC on January 20, 2001     showed a WBC of 5.1 with 91% neutrophils, 7% bands.  Will recheck CBC      with Differential and check blood cultures x 2.  9. Thrombocytopenia.  Platelets have dropped from 112,000 in early December     2002 to 59,000 on January 20, 2001.  Will recheck. 10. History of cytomegalovirus.  Will check multiple cytomegalovirus     studies. 11. Hyperlipidemia, on Zocor.  Continue. 12. Noted weight loss.  Patient weighed 149 pounds in November 2002, down to     142 pounds January 06, 2001; reportedly 137 pounds at Dr. Gita Kudo office.     Will check  upon hospital admission.  No-added salt diet for now.     Re-evaluate pending laboratory results. 13. Chronic cough.  Will check chest x-ray to rule out occult pneumonia. Dictated by:   Sheffield Slider, P.A.C. Attending:  Llana Aliment. Deterding, M.D. DD:  01/29/01 TD:  01/30/01 Job: [id number]

## 2010-06-15 NOTE — Procedures (Signed)
EEG NUMBER:  [id number].   HISTORY:  This is a 77 year old gentleman who has a history of TIA,  Guillain-Barre syndrome, end-stage renal disease status post renal  transplant who has presented with confusion, disorientation.  The  patient is being evaluated for the above symptoms.   This is a routine EEG.  No skull defects noted.   MEDICATIONS:  Include Synthroid, Megace, Zestril, Restoril, Zocor,  vancomycin, Zemplar, Epogen.   EEG CLASSIFICATION:  Normal awake.   DESCRIPTION OF RECORDING:  The background rhythm of this recording  consists of a fairly well modulated medium amplitude alpha rhythm of 8  Hz that is reactive to eye open and closure.  As the record progresses,  the patient appears to remain in an awake state throughout the entirety  of the recording.  Photic stimulation is performed resulting in a  bilateral and symmetric photic drive response.  Hyperventilation was not  performed.  At no time during the recording did there appear to be  evidence of spike, spike wave discharges, or evidence of focal slowing.  EKG monitor shows no evidence of cardiac rhythm abnormalities.  The  heart rate is 72.   IMPRESSION:  This is a normal EEG recording in the waking state.  No  evidence of ictal or interictal discharges were seen.      Marlan Palau, M.D.  Electronically Signed     QVZ:[id number]  D:  02/13/2006 17:05:13  T:  02/13/2006 21:48:20  Job #:  [id number]

## 2010-06-15 NOTE — Discharge Summary (Signed)
NAME:  [name], [name] NO.:  [id number]   MEDICAL RECORD NO.:  [id number]          Patterson TYPE:  INP   LOCATION:  5522                         FACILITY:  [facility]   PHYSICIAN:  [name], M.D.DATE OF BIRTH:  1926/06/22   DATE OF ADMISSION:  02/12/2006  DATE OF DISCHARGE:  02/24/2006                               DISCHARGE SUMMARY   ADMISSION DIAGNOSES:  1. Altered mental status with expressive aphasia.  2. End-stage renal disease on chronic hemodialysis.  3. History of transient ischemic attack, dementia.  4. Hypertension.  5. History of anemia of chronic disease.  6. Secondary hyperparathyroidism.  7. Hypothyroidism.   DISCHARGE DIAGNOSES:  1. Transient ischemic attack secondary to small vessel disease.  2. Dementia, chronic, advancing.  3. Anemia of chronic disease.  4. End-stage renal disease on chronic hemodialysis.  5. Secondary hyperparathyroidism.  6. Hypothyroidism.  7. History of hypertension.   CONSULTS:  Neurology, [name].   PROCEDURES:  1. Hemodialysis.  2. Limited MRI of Raymond brain without contrast.   IMPRESSION:  1. Limited study, as Patterson was not able to complete Raymond study, with      generalized atrophy and very mild chronic ischemic change, no acute      infarct on February 12, 2006.  2. On February 12, 2006, chest x-ray showed no acute findings.  3. On February 18, 2006, CT scan abdomen and pelvis showed      cholelithiasis, sigmoid diverticulosis, ventral hernia of right      lower quadrant, tiny left inguinal hernia, mild prostatic      enlargement.  Also two questionable nodules right lung base;      recommended CT scan followup to better assess pulmonary findings.   BRIEF HISTORY REASON FOR Raymond ADMISSION:  [name] is a 77 year old white  male with end-stage renal disease, on chronic hemodialysis, who was  brought in by family for evaluation for confusion, disorientation and  not being Raymond usual self for Raymond past 3  days.  History was by Raymond  Patterson, noted that [name] was acting different, forgetting Raymond  social security number, which he usually knows, how to sign Raymond name and  not taking Raymond medications which was unusual for him.  This did not  resolve and hence he was brought to [facility].  He was diagnosed  with dementia at that time, possible pneumonia, given one dose of  Levaquin 750 mg and discharged home.  Patterson noted that [name]  woke up during Raymond night, walked around Raymond house opening doors and  cabinets.  [name] had an episode of this confusion also in November  of 2006.  He was hemodialyzed at [facility] and admitted  to [facility].  At that time he was noted to have a left brain  TIA; however, brain MRI scan did not reveal acute infarct.  Symptoms  resolved in a couple of days in last admission but because of Raymond  findings, which are not resolving, he is being admitted this admission  by [name] with Neurology consult  to help evaluate.  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  With this admission,  because of Raymond reported pneumonia on chest x-ray at [facility],  [name] treated [name] with vancomycin, Elita Quick after blood culture  obtained, checked an MRI of Raymond head and have Neurology consult.  Again  noted he had 750 mg of Levaquin at [facility], which is a high dose for  a dialysis Patterson, but he was having confusion before this also.   COURSE IN HOSPITAL:  Raymond MRI results, as noted above, showed no acute  findings, significant small vessel disease.  [name] saw [name]  in consult with workup starting in addition to Raymond MRI of Raymond head.  An  EEG was obtained.  Raymond EEG was essentially normal on Raymond 17th, as noted  by Neurology.  Note [name] had a second episode of significant  effusion noted on Raymond 18th after Raymond  pressure dropped on hemodialysis  where he became agitated, with acute confusion, significant, and had  some cyanosis of Raymond lips.  He opened both eyes but was not responding  to voice stimuli.  Sytolic blood pressure dropped to 58.  [name]  did not respond initially, was moved to Neuro ICU.   Evaluation was made to see if he had an infective process which would  cause Raymond altered mental status, with a chest x-ray grow out infiltrate,  which it did, in-and-out cath showed no urine in Raymond bladder.  Blood  cultures were obtained which showed no signs of growth at Raymond time of  this dictation.  He did have a CMV EIA ordered and CMV IgM which was  less than 0.9.  Cytomegaly antibody __________ was 11.71.  Raymond TSH was  evaluated which was within normal range, RPR was nonreactive, Vitamin  B12 was slightly high at 1590.  He had no further workup for Raymond altered  mental status with no significant spike in Raymond blood pressure.  White  count was normal throughout Raymond hospital stay.  On rounds on Raymond 19th  [name] was much better, he was oriented to name and year but not  place.  He was responding inappropriately and did not remember Raymond day  prior events and became acutely agitated and unresponsive.  Note that  Raymond blood pressure when he was better oriented was 160/78.  However, Raymond  Patterson had some again poor mentation where he had slow speech and  oriented only x1 noted on Raymond 20th of January.  However, he was able to  ambulate in Raymond hall with assistance.  [name]'s mental status waxed  and waned over Raymond next 48 hours and it was noted, because of [name]'s  illness, who he was living with before, he would need nursing home  placement.  A discussion was held with Patterson's Patterson and Raymond Management  social Patterson found a nursing home bed in [facility].  On  February 24, 2006, [name] had no complaints, says he felt great, mental status was back to baseline.  He was on  hemodialysis, Raymond blood  pressure stable.  It should be noted he is not any hypotensive  medications.  He is on aspirin 81 mg daily in addition to Zocor 20 mg at  bedtime.  Will raise Raymond dialysis time from 3 hours and 15 minutes to 3  hours and 30 minutes to help stabilize Raymond blood pressure.  He may be  needed to dialyze 4 hours, but at this time will raise him to 3-1/2  hours to help stabilize blood pressure time.   Anemia.  [name]'s hemoglobin noted to be 12.5 before discharge.  Will continue on Epogen at 5000 units q.hemodialysis.  He was not on any  weekly iron.   Secondary hyperparathyroidism.  [name]'s calcium and phosphorus  noted to be 9.5, calcium and phosphorus 5.3 with an albumin of 3.6.  Before discharge she was on Fosrenol 1 g with meals for phosphate binder  and Zemplar 2 mcg IV q.hemodialysis.   Hypothyroidism.  [name]'s TSH was evaluated when he was admitted,  within normal limits.  He was continued on Synthroid __________ daily.   MEDS AT DISCHARGE:  1. Synthroid 75 mcg q.h.s.  2. Megace 40 mg two daily.  3. Colchicine 0.6 mg daily.  4. Nephro-Vite one daily.  5. Zocor 20 mg q.h.s.  6. Aspirin 81 mg daily.  7. PhosLo 667 mg one with meals.  8. Aspirin 81 mg daily.  9. Zemplar 2 mcg IV q.hemodialysis.  10.Epogen 5000 units q.hemodialysis.  11.Fosrenol 1 g with meals.   DIET:  1. Was 80 g protein, 2 g potassium, 2 g sodium with 1200 mL p.o.      daily.  2. Nepro, vanilla, one can b.i.d.   Follow up with [facility] on Monday, Wednesday, Friday  schedule.  Will need physical therapy for increasing mobility and  strengthening.      Donald Pore, P.A.    ______________________________  [name], M.D.    DWZ/MEDQ  D:  02/24/2006  T:  02/24/2006  Job:  [id number]   cc:   [facility]  Pramod P. Pearlean Brownie, MD  [name], M.D.

## 2010-06-15 NOTE — Discharge Summary (Signed)
[place]. [facility]  Patient:    [name], [name]                       MRN: [id number] Adm. Date:  [id number] Disc. Date: [id number] Attending:  Lurlean Nanny Dictator:   Sheffield Slider, P.A.C. CC:         Dr. Fayrene Fearing L. Deterding             Dr. Judie Petit T. Pleas Koch.             Dr. Rockey Situ. Flavia Shipper.                           Discharge Summary  DISCHARGE DIAGNOSES: 1. Cytomegalovirus colitis. 2. Dehydration secondary to #1. 3. Anemia. 4. Renal transplant with chronic rejection.  PROCEDURES: 1. Transfusion two units of packed red blood cells on 06/08/99. 2. Colonoscopy on 06/07/99 by Dr. Claudette Head.  Colonoscopy to hepatic    flexure showed patchy nonspecific colitis more severe in the transverse    colon with small internal and external hemorrhoids.  Sigmoid    diverticulosis.  HISTORY OF PRESENT ILLNESS:  [name] is a 77 year old white male with renal failure secondary to IgA nephropathy with a living-related transplant in 1998.  [name] had problems with intolerance of cyclosporine and Prograf and now is on CellCept and prednisone.  Creatinine has been approximately 3.5 with slow chronic rejection.  Last two weeks the patient has new onset nausea, vomiting, diarrhea, weight loss, and now presents with a creatinine of 4.3, CO2 of 14, complaining of weakness, increasing shortness of breath with exertion.  Today, he noted rectal bleeding x 1 with a history of hemorrhoids.Marland Kitchen  REMAINDER OF PHYSICAL EXAMINATION:  Outlined in admission history and physical by Dr. Havery Moros.  ASSESSMENT AND PLAN:  Patient was admitted to the renal unit and placed in a private room due to his renal transplant status.  ADMISSION LABS:  White count of 4.7, hemoglobin 9.3, hematocrit 27.7, platelets 160,000.  PT 13.8, INR 1.1, PTT 31.  Sodium 136, potassium 4.2, chloride 118, CO2 less than 10, glucose 106, BUN 123, creatinine 5.1.  Calcium 9.2, albumin  3.8, AST 12, ALT 10, alkaline phos 65, total bilirubin 0.3.  HOSPITAL COURSE:  Patient was initially placed on clear liquids, his usual transplant medications, and rehydrated with supplemental Shohl solution. Later, he received IV fluids containing bicarb and showed a progressive improvement in renal function by 5/14.  Creatinine was down to 2.7, BUN to 48, and bicarb of 22.  GI consult was provided by Dr. Russella Dar.  He was taken for colonoscopy on 5/10 with a biopsy done that was consistent with cytomegalovirus.  Dr. Burnice Logan saw the patient in consultation with recommendations for appropriate treatment using ganciclovir.  His previous Flagyl and Colacol were discontinued.  Ganciclovir was started on 5/11. Patients white count was 3.1, it drifted down to 2.4; however, it was back up to 4.1 on 5/14.  Patient improved quite dramatically.  He received some supplemental potassium chloride for potassiums as low as 3.0.  Hemoccults were checked several times and found to be negative.  C. difficile was also negative.  Iron studies showed patient had adequate iron.  Total iron on 5/14 was 84 with 86% saturation and ferritin of 620.  This was several days after he had received a transfusion on 5/11 for a hemoglobin of 7.0.  Patient had already been receiving Epogen twice a week at the [facility]. Arrangements were made for him to receive IV ganciclovir there on a daily basis for a total of two weeks at which time he will be transitioned to oral ganciclovir.  CBC and BMET will be monitored twice a week and he will follow up with Dr. Darrick Penna towards the end of May.  The office of [facility] will call the patient for the exact appointment time.  Having greatly improved, the patient was discharged home on May 14.  DISCHARGE MEDICATIONS: 1. CellCept 1 g twice a day. 2. Prednisone 10 mg every day. 3. KCl 40 mEq every day. 4. Epogen 10,000 units subcu Wednesday and  Saturday. 5. Prevacid 30 mg per day. 6. Rocaltrol 0.5 mcg every day. 7. Anusol HC cream b.i.d. 8. Ganciclovir 200 mg IV every day x 2 weeks, then 200 mg per day x 4 weeks. 9. Imodium one tablet every loose stool (not to exceed six tablets per day).  DISCHARGE DIET:  No added salt.  SPECIAL INSTRUCTIONS:  The patient is to have a CBC and BMET twice a week. Office of [employer] will follow up with appointment and whether any medication modifications need to be made; for example, when to resume diuretics. DD:  06/22/99 TD:  06/24/99 Job: [id number]

## 2010-06-15 NOTE — H&P (Signed)
[place]. [facility]  Patient:    [name], [name] Visit Number: [id number] MRN: [id number]          Service Type: MED Location: [phone] Attending Physician:  Lurlean Nanny Dictated by:   Sheffield Slider, P.A.C. Admit Date:  07/02/2001   CC:         Fayrene Fearing L. Deterding, M.D., [facility]   History and Physical  HISTORY OF PRESENT ILLNESS:  The patient is a 77 year old white male with a living-related-donor renal transplant since December 1998.  He has never been on hemodialysis and had a history of chronic rejection with creatinine around the 4s.  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  He was followed during that admission by Drs. Genene Churn. Love and Pierce Crane, respectively.  Dr. Lacretia Leigh. Ninetta Lights has followed the patients CMV in the past.  He was last seen in the office of [facility] by Dr. Fayrene Fearing L. Deterding, May 21st, and was put back on CellCept to preserve his renal function and suppress his ITP.  He also has had progressive weakness since that time.  He has been able to get out of bed.  Today, he had to actually crawl to the car.  He is unable to bear weight with his left leg, it gives out, and he is short of breath.  Upon recommendation of Dr. Darrick Penna, he came to the [facility] Emergency Department for evaluation and admission.  He describes being unable to walk since last week.  He has pain in the left popliteal area, no numbness or tingling in his lower extremities.  He has pain with flexion or contraction of the leg and also with palpation of the popliteal area tendons and hamstring.  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 Donnie Coffin; anemia; hyperlipidemia; prostatism; history of BPH; hyperparathyroidism; diverticular disease; colon polyps; depression; hemorrhoids; renal tubular acidosis, off bicarbonate; hypothyroidism since January 2003; hypertension; history of left lower lobe infiltrate versus bronchitis, January of 2003, treated with a Z-Pak; Proteus mirabilis UTI, treated with Keflex, May 28th; history of peripheral neuropathy related to Prograf and recurrent CMV.  FAMILY HISTORY:  Father died at age 1 of CVA.  Mother died at age 70. Positive family of hypertension.  SOCIAL HISTORY:  Remote tobacco.  No alcohol.  Supportive wife.  Son lives next door.  Has several other siblings.   MEDICATIONS:  1. CellCept 250 mg b.i.d.  2. Prednisone 10 mg per day.  3. Prilosec 20 mg per day.  4. Rocaltrol 0.5 mcg every other day; last PTH was 14, May 23rd.  5. Zocor 40 mg per day.  6. Labetalol 100 mg b.i.d.  7. Allopurinol 100 mg per day, just started May 23rd.  8. Plendil 10 mg q.a.m. and 5 mg q.p.m.  9. Lasix 160 mg b.i.d. 10. Ferrous sulfate 325 mg t.i.d.; last iron total saturation was 41%,     May 23rd. 11. Synthroid 75 mcg per day; TSH was 2.9 on May 23rd.  ALLERGIES:  The patient has no known drug allergies but does have notation that PROGRAF caused peripheral neuropathy.  REVIEW OF SYSTEMS:  Positive for shortness of breath at rest and dyspnea on  exertion.  The patient had been walking up until last Thursday or Friday.  He is anorexic, has some nausea.  No vomiting.  No chest pain.  No orthopnea. Says he can sleep flat.  Makes urine.  Occasional dysuria.  No obvious hematuria.  No constipation nor diarrhea.  Positive generalized fatigue and weakness.  No numbness.  No tingling.  No itching.  PHYSICAL EXAMINATION:  VITAL SIGNS:  Temperature 97, blood pressure 124/57, pulse 71, respirations 20.  GENERAL:  A chronically ill white male resting comfortably in  the ER hallway without oxygen.  He has somewhat of a uremic odor.  HEENT:  Normocephalic, atraumatic.  Sclerae nonicteric.  NECK:  No JVD.  HEART:  Regular rate, positive S4, 2/6 systolic murmur.  LUNGS:  Grossly clear, perhaps a few crackles at the right base, somewhat decreased expansion.  ABDOMEN:  Right lower quadrant nontender transplant kidney.  Umbilical hernia. Positive bowel sounds.  Soft, nontender and nondistended throughout.   EXTREMITIES:  Left A-V fistula patent.  Right wrist and fingers painful and swollen.  Significant pain at posterior left popliteal and hamstring area, no erythema or swelling.  Has arthritic knees.  NEUROLOGIC:  Alert and oriented x3.  Moves all extremities well.  MUSCULOSKELETAL:  Equal and symmetric strength throughout, upper and lower.  SKIN:  Fair.  Multiple seborrheic keratoses.  ASSESSMENT AND PLAN:  1. Chronic renal failure secondary to chronic rejection of renal transplant     with a progressively increased BUN.  We will recheck comprehensive     metabolic panel and phosphorus today.  Continue prednisone, Lasix,     CellCept, pending laboratory results.  Place in private room.  May need to     initiate dialysis to improve overall condition.  2. Anemia.  Last hemoglobin was 11.4.  Epogen has been on hold.  Transferrin     saturations were okay, only on oral iron.  Will check complete blood     count.  3. Left posterior/popliteal leg pain.  Physical therapy -- evaluate.     Questionable musculoskeletal contractures and cannot bear weight but     overall has good strength in his lower extremities.  This area is very     painful.  4. Gout.  Continue allopurinol.  Severe gout in the right hand is affecting     his mobility and has contributed to his progressive deconditioning.  5. History of Guillain-Barre, no symptoms at present.  6. History of cytomegalovirus, questionable component of deconditioning, but     hold off on further  evaluative testing for now.  7. Hypothyroidism, Synthroid recently increased.   8. Hypertension.  Medications as ordered.  Blood pressure is good today.  9. Recent Proteus urinary tract infection.  Recheck urine cultures. 10. Idiopathic thrombocytopenia.  Last platelets were 43,000.  Allopurinol was     restarted in May.  Will recheck today with complete blood count. Dictated by:   Sheffield Slider, P.A.C. Attending Physician:  Lurlean Nanny DD:  07/02/01 TD:  07/04/01 Job: [id number]

## 2010-06-15 NOTE — Op Note (Signed)
   NAME:  RASOOL, ROMMEL NO.:  [id number]   MEDICAL RECORD NO.:  [id number]                   PATIENT TYPE:  EMS   LOCATION:  MAJO                                 FACILITY:  [facility]   PHYSICIAN:  Janetta Hora. Fields, MD               DATE OF BIRTH:  01-14-1927   DATE OF PROCEDURE:  08/16/2002  DATE OF DISCHARGE:                                 OPERATIVE REPORT   PROCEDURE:  Suture repair of AV fistula, left.   PREOPERATIVE DIAGNOSIS:  Bleeding AV fistula.   POSTOPERATIVE DIAGNOSIS:  Bleeding AV fistula.   INDICATIONS FOR PROCEDURE:  The patient is status post dialysis today. He  developed a hemorrhagic area in his fistula and bleeding which could not be  controlled by direct pressure.   ANESTHESIA:  Local.   FINDINGS:  Small hole in the AV fistula of the left upper extremity with  active pulsatile bleeding   DESCRIPTION OF PROCEDURE:  The patient's left upper extremity was  infiltrated with local anesthesia around the area of bleeding. Several 3-0  nylon sutures were placed in the skin to control the bleeding. Hemostasis  was obtained. The patient tolerated the procedure well and there were no  complications. The patient was discharged to home from the emergency room  after a dry sterile dressing was applied.                                               Janetta Hora. Fields, MD    CEF/MEDQ  D:  08/16/2002  T:  08/17/2002  Job:  [id number]

## 2010-06-15 NOTE — Procedures (Signed)
[place]. [facility]  Patient:    [name], [name]                       MRN: [id number] Proc. Date: 07/26/00 Adm. Date:  [id number] Attending:  Dayle Points CC:         Duke Salvia. Eliott Nine, M.D.   Procedure Report  PROCEDURE PERFORMED:  Flexible sigmoidoscopy.  INDICATIONS:  This 77 year old gentleman with chronic renal failure, status post renal transplant, has had CMV colitis which has been treated and has gone into remission.  He had two discrete episodes of the rectal bleeding and his hemoglobin has dropped.  He is undergoing flexible sigmoidoscopy to assess site of the bleeding which is presumably an anal or sigmoid colon origin.  He had previous colonoscopy within the last two years, which did not show any significant lesions.  He had benign polyps in the past.  ENDOSCOPE:  Fujinon single-channel endoscope.  SEDATION:  None.  PREP:  None.  FINDINGS:  Fujinon single-channel endoscope was passed routinely through rectum to the sigmoid colon.  The patient was monitored by pulse oximeter.  He received no sedation and because of his diarrhea did not receive any prep. The perianal area was very erythematous and pruritic.  Rectal tone was increased, was very tight and very painful anal canal.  There were large hemorrhoids internally which were protruding into the rectal ampulla.  As the endoscope was introduced into the rectum, there was a bleeding which was precipitated by distention of the anal canal and there was bright red exuding from the anal canal hemorrhoid.  The sigmoid mucosa was unremarkable.  There was some liquid stool in the sigmoid colon which was suctioned out.  There were no lesions all the way up to 35 cm.  At that point, the endoscope was retracted and colon decompressed.  The patient tolerated the procedure well.  IMPRESSION: 1. Complete resolution of CMV colitis. 2. Anal bleeding, most likely from hemorrhoids.  PLAN: 1.  Rectal care, including Corticaine cream and Anusol-HC suppositories. 2. Discontinuation of his ______, which is causing his diarrhea. DD:  07/26/00 TD:  07/26/00 Job: [id number]

## 2010-06-15 NOTE — Discharge Summary (Signed)
[place]  Patient:    [name], [name] Visit Number: [id number] MRN: [id number]          Service Type: [facility] Location: [id number] Attending Physician:  Herold Harms Dictated by:   Mcarthur Rossetti. Angiulli, P.A. Admit Date:  02/10/2001 Discharge Date: 02/24/2001   CC:         Pierce Crane, M.D.  Shelba Flake, M.D.  Genene Churn. Love, M.D.   Discharge Summary  DISCHARGE DIAGNOSES: 1. Guillain-Barre syndrome. 2. Renal transplant 1998. 3. Thrombocytopenia, leukopenia, pancytopenia, anemia, bronchitis; resolved. 4. Hyperlipidemia. 5. Gout. 6. History of colon polyps. 7. New findings of hypothyroidism and benign prostatic hypertrophy.  HISTORY OF PRESENT ILLNESS:  [name] is a 77 year old white male with multiple medical issues concerning renal transplant in 1998, chronic rejection, leukopenia, thrombocytopenia, peripheral neuropathy/CMV.  He was admitted to [facility] on January 29, 2001 with persistent numbness and tingling in the upper and lower extremities.  Upon evaluation, lumbar puncture by Dr. Sandria Manly with protein 92 and glucose 54.  MRI of the cervical thoracic lumbar spine with subtle changes but no cord compression.  He was placed on plasmapheresis for suspect Guillain-Barre syndrome.  Hematology followup, Dr. Donnie Coffin, for ongoing leukopenia.  Patient remained on prednisone therapy 60 mg daily.  Renal service follow up and monitor while on plasmapheresis.  He developed a bout of bronchitis.  Chest x-ray was negative. Treated with five-day course of Zithromax.  Required supervision for bed mobility.  Endurance continued to improve.  Latest laboratories on February 09, 2001 showed hemoglobin 8.9, WBC 3.7, platelet 47,000.  Laboratories on February 08, 2001 showed BUN 84, creatinine 3.0.  TSH level on January 29, 2001 was 10.73.  Synthroid was initiated on January 30, 2001.  He was admitted for comprehensive rehabilitation  program.  PAST MEDICAL HISTORY: 1. Renal transplant in 1998. 2. Benign prostatic hypertrophy with transurethral resection of the prostate. 3. Chronic anemia. 4. Hyperlipidemia. 5. Gout. 6. Right upper pole kidney artery stenosis. 7. Colon polyps.  His primary M.D. is [employer].  ALLERGIES:  None.  SOCIAL HISTORY:  No alcohol or tobacco. He lives with his [name] in [place]. One-level home.  Three steps to entry.  He used a cane up until December with decreased function since that time.  [name] to assist on discharge.  He also has a son next door that works day shift.  MEDICATIONS PRIOR TO ADMISSION: 1. CellCept 500 mg twice daily. 2. Prednisone 10 mg daily. 3. Prilosec 30 mg daily. 4. Lasix 60 mg daily. 5. Labetalol 50 mg twice daily. 6. Plendil 10 mg twice daily. 7. Colchicine 0.6 mg daily. 8. Zocor 20 mg daily. 9. Epogen weekly.  HOSPITAL COURSE:  Patient with progressive gains on rehabilitation services with therapies initiated on a b.i.d. basis.  The following issues were followed during patients rehabilitation course.  Pertaining to Mr. Troublefield multiple medical issues, Guillain-Barre syndrome continued to improve.  His plasmapheresis had been completed and has been since placed on hold per follow up of renal services.  His mobility has greatly improved.  He is now independent in his room. Patient was encouraged with overall progress.  He will receive ongoing outpatient therapies at [facility].  He was followed intensively by the [facility] for his history of renal transplant.  His CellCept has since been placed on hold.  He continued on his prednisone therapy of 60 mg daily.  He would follow up with Dr.  Donnie Coffin on February 27, 2001 for taper at that time.  He received ongoing follow up laboratories for his renal functions with latest BUN 69 and creatinine 2.7.  Again follow up laboratories would be provided at [facility] on February 26, 2001.  His Lasix had been adjusted throughout his course.  He was now on 80 mg twice daily at time of discharge.  He showed no signs of fluid overload.  Followed by hematology for his leukopenia and pancytopenia.  He continued on his Epogen which was now three times a week.  Prednisone would be addressed per Dr. Donnie Coffin.  His latest hemoglobin was 9.8, hematocrit 29.3, WBC 2.4 and platelet now 72,000 on two separate occasions while on rehabilitation services.  He had received a unit of platelets by transfusion.  A bone marrow biopsy had also been completed per Dr. Donnie Coffin on February 19, 2001.  He would follow up with Dr. Donnie Coffin for those results that were not available at time of discharge.  He had no gastrointestinal complaints.  He continued on his Protonix.  He had completed a course of Zithromax for bronchitis.  He remained afebrile throughout his course.  New findings of hypothyroidism with TSH level of 10.73 on January 29, 2001. This would need follow up as an outpatient.  He is maintained on 50 mcg daily of Synthroid.  Concerning his overall functional status, patient had greatly improved.  All of his medical follow up was by hematology and renal services with ongoing laboratories daily.  Latest laboratories provided on February 24, 2001 with hemoglobin 9.8, hematocrit 29.3, WBC 2.4, platelets 72,000.  LDH on February 23, 2001 259. Liver function studies on February 23, 2001 were unremarkable.  Latest reticulocyte count of 5.5.  BUN 69, creatinine 2.7 on February 23, 2001. Again, he would follow up with Dr. Donnie Coffin concerning latest bone marrow biopsy results.  DISCHARGE MEDICATIONS: 1. Prilosec 30 mg daily. 2. Zocor 20 mg daily. 3. Plendil 5 mg daily. 4. Epogen 20,000 units three times weekly at [facility]. 5. Synthroid 50 mcg daily.  6. Neurontin 300 mg at bedtime. 7. Prednisone 20 mg three tablets daily with food. 8. Lasix 80 mg twice  daily.  ACTIVITY:  As tolerated.  DIET:  No added salt.  SPECIAL INSTRUCTIONS:  Follow up laboratories per [facility] on February 26, 2001.  He should see Dr. Donnie Coffin of Hematology Services [phone] on February 27, 2001.  Outpatient therapies at [facility] scheduled to begin on February 26, 2001 at 10:00 a.m.  He should see Dr. Sandria Manly, Neurology Services, as advised. Dictated by:   Mcarthur Rossetti. Angiulli, P.A. Attending Physician:  Herold Harms DD:  02/24/01 TD:  02/24/01 Job: [id number]

## 2010-06-15 NOTE — Procedures (Signed)
[place]  Patient:    [name], [name]                         MRN: [id number] Proc. Date: 02/08/99 Attending:  Hedwig Morton. Juanda Chance, M.D. [facility] CC:         Llana Aliment. Deterding, M.D.                           Procedure Report  PROCEDURE:  Upper endoscopy.  INDICATIONS:  This 77 year old white male, renal transplant patient with chronic rejection, has had a progressive drop in his hemogram despite Epogen.  He also ad episodic abdominal pain above the umbilicus in the midline.  He denies taking any NSAIDS.  He has no previous history of upper GI problems.  He had a normal colonoscopy several months ago for follow up of colon polyps.  He denies any visible rectal bleeding.  He is undergoing an upper endoscopy to evaluate his anemia.  ENDOSCOPE:  Fujinon single-channel video endoscope.  SEDATION:  Versed 9 mg IV.  FINDINGS:  ESOPHAGUS:  The Fujinon single-channel video endoscope passed under direct vision through the posterior oropharynx into the esophagus.  The patient was monitored by pulse oximeter.  His oxygen saturations were normal.  The proximal, mid, and distal esophageal mucosa were normal.  There was no esophagitis. There was a mild, nonobstructing, fibrous ring at the GE junction.  Distal to the ring was a large hiatal hernia.  There were no erosions or any signs of bleeding from this area.  STOMACH:  The stomach was insufflated with air and showed a 5 cm hiatal hernia extending from 36-41 cm from the incisors.  The hernia was non-reducible.  The gastric folds were normal throughout the stomach.  The gastric antrum was unremarkable.  A retroflexion of the endoscope confirmed the presence of hiatal  hernia.  The pyloric outlet was initially spastic but opened up with pressure, nd it showed a normal pyloric channel.  DUODENUM:  The duodenum, duodenal bulb, and descending duodenum were unremarkable. Small bowel biopsies were taken from  the descending duodenum to rule-out primary mucosal disease of the small bowel which would cause iron malabsorption.  The patient tolerated the procedure well.  IMPRESSION:  Hiatal hernia, status post CLO test and status post small bowel biopsies.  PLAN:  The patient was reexamined with a rectal exam after the endoscopy, and his stool was Hemoccult negative.  At this time, we do not have any evidence of GI blood loss.  His dropping H&H may be of other etiology other than due to blood loss.  Because of the large hiatal hernia, I recommend antireflux measures and cid suppressing agents on p.r.n. basis.  Will be happy to see him in the office should he develop GI blood loss. DD:  02/08/99 TD:  02/08/99 Job: [id number]

## 2010-06-15 NOTE — Consult Note (Signed)
NAME:  JAISHAWN, WITZKE NO.:  [id number]   MEDICAL RECORD NO.:  [id number]          PATIENT TYPE:  INP   LOCATION:  5501                         FACILITY:  [facility]   PHYSICIAN:  Yetta Barre, M.D.  DATE OF BIRTH:  07-Apr-1926   DATE OF CONSULTATION:  05/02/2006  DATE OF DISCHARGE:                                 CONSULTATION   HISTORY AND PHYSICAL:  Mr. Sizelove is a 77 year old, white, unfortunate man  with baseline dementia/expressive aphasia with end-stage renal disease  on chronic dialysis, hypertension, anemia of chronic disease, and other  medical problems, presenting to the [facility] by caravan  transfer from [facility] in the state of altered mental status.  He had a CT head and chest x-ray done there, which essentially showed no  acute changes.  He was transferred here for hemodialysis, further  management and placement issues.   ALLERGIES:  ?   PAST MEDICAL HISTORY:  1. Altered mental status with expressive aphasia.  2. End-stage renal disease on chronic hemodialysis since 1998.      Receives hemodialysis Monday, Wednesday, Friday at [facility].  The patient is status post renal transplant, which has      failed in March of 2004.  3. History of TIA in May of 2006.  4. Hypertension.  5. Anemia of chronic kidney disease.  6. Secondary hypoparathyroidism.  7. Hypothyroidism.  8. 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  9. BPH.  10.History of GB syndrome with plasma exchange in 2003 and 2006 with      residual paresthesias.  11.Depression.  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  13.History of PE in March of 2004.   MEDICATIONS:  1. Aricept 5 mg p.o. q.h.s.  2. Senokot 2.33 tabs p.o. b.i.d.  3. Aspirin 81 mg p.o. daily.  4. Zocor 20 mg p.o. daily.  5. Nephrolith 1 p.o. daily.  6. Colchicine 0.6 mg p.o. daily.  7. Megace 40 mg p.o. daily.  8. Synthroid 75 mcg p.o. daily.  9. Metanx  p.o. daily.  10.Lactulose 1 tbsp p.o. p.r.n.  11.MiraLax 1 tbsp p.o. daily.   SOCIAL HISTORY:  The patient lives in [place] with wife.  He is an exRisk manager.  No history of alcohol abuse.   FAMILY HISTORY:  Noncontributory.   PHYSICAL EXAMINATION:  VITAL SIGNS:  Temperature 98.3, blood pressure  139/98, pulse 89, respiratory rate 18, O2 sat 93% on room air, weight  64.2.  GENERAL APPEARANCE:  The patient is sleeping, no in acute distress.  NECK:  Supple.  No adenopathy.  LUNGS:  Air entry bilaterally.  Bilateral basilar crackles present.  HEART:  Regular rate and rhythm.  No murmurs, rubs or gallops.  ABDOMEN:  Soft, nontender, nondistended.  Bowel sounds present.  EXTREMITIES:  Trace edema present.  NEUROLOGICALLY:  Dementia present.  Strength 5/5 bilaterally, symmetric.  Reflexes decreased bilaterally of lower extremities.  No obvious sensory  loss.   LABORATORY DATA:  Not done.   DIAGNOSIS:  1. Worsening of  dementia versus transient ischemic      attack/cerebrovascular accident versus a slight abnormality versus      drug affect versus infection (Pneumonia versus UTI), plan is to      check CBC with differential, check renal function panel, check UA.      Of note, CT head is negative, and no new neurological      abnormalities; hence, will not do MRI/MRA.  Also, new chest x-ray      did not show pneumonia.  No empiric antibiotic coverage for now.  2. End-stage renal disease on chronic hemodialysis Monday, Wednesday,      Friday at [facility].  The patient dialyzed.  Dry      weight of 63.5 x 3 hours 30 minutes, 380/760, Zemplar 1.50 mcg 3      times a week, Epogen 4500 3 times a day.  We will continue during      hospitalization.  3. Anemia of chronic disease.  Check CBC, lipids.  If hemoglobin low,      we will continue Epogen during hospitalization.  4. Dementia:  Continue Aricept.  5. Agitation:  We will add Ativan 1-2 mg q.8 hours p.r.n.  6.  Hypothyroidism:  On Synthroid.  7. Prophylaxis:  Protonix and Lovenox.      Yetta Barre, M.D.  Electronically Signed     SS/MEDQ  D:  05/02/2006  T:  05/04/2006  Job:  [id number]

## 2010-06-15 NOTE — Consult Note (Signed)
NAME:  [name], [name] NO.:  [id number]   MEDICAL RECORD NO.:  [id number]          PATIENT TYPE:  INP   LOCATION:  5501                         FACILITY:  [facility]   PHYSICIAN:  Marlan Palau, M.D.  DATE OF BIRTH:  12-13-1926   DATE OF CONSULTATION:  05/04/2006  DATE OF DISCHARGE:                                 CONSULTATION   HISTORY OF PRESENT ILLNESS:  [name] is a 77 year old right-handed  white male born on [birth year] with a history of end-stage renal  disease and organic brain syndrome.  This patient has been seen in the  past for events of confusion.  This patient has been brought into the  hospital once again for onset of problems with confusion that had  relatively sudden.  The patient himself does not recall any events  during the episodes.  The patient is not sure how frequently these  spells are coming on, but the wife claims that they are getting more  frequent.  The patient has had evaluations for stroke that have been  negative in the past.  Last was seen through this hospital in January of  2008.  MRI study at that time did not show acute stroke.  EEG studies  have shown some slowing but no epileptiform discharges.  The patient  comes in once again with a spell of confusion, speech disturbance.  During this admission, the altered mental status has already been  resolved.  Apparently, a CT scan of the head done recently was  unremarkable.  I do not have this for my review.  Neurology was asked  see this patient for further evaluation.  Following the above events,  the patient has no long-lasting residual.   PAST MEDICAL HISTORY:  1. Episodes of confusion.  Speech disturbance as above.  2. End-stage renal disease, on hemodialysis.  3. History of renal transplant that failed.  4. Hypertension.  5. Anemia of chronic disease.  6. Secondary hyperparathyroidism.  7. Hypothyroidism.  8. Organic brain syndrome.  9. Small vessel disease by  MRI that is fairly minimal.  10.History of CMV colitis in the past.  11.History of Guillain-Barre syndrome with residual peripheral      neuropathy.  12.History of aortic stenosis.  13.History of gout.  14.History of benign prostatic hypertrophy.  15.History of ITP.  16.History of diverticulosis, hemorrhoids, colon polyps.  17.History of depression.  18.History of transient atrial fibrillation in the past.   ALLERGIES:  The patient has allergy to TACROLIMUS.   SOCIAL HISTORY:  Does not currently smoke or drink.   CURRENT MEDICATIONS:  1. Aspirin 81 mg daily.  2. Colchicine 0.6 mg daily.  3. Aricept 5 mg daily.  4. Lovenox 40 mg subcutaneous daily.  5. Synthroid 0.075 mg daily.  6. Megace 40 mg daily.  7. Protonix 40 mg daily.  8. MiraLax 17 grams daily.  9. Nephro-Vite daily.  10.Senokot 2-3 tablets daily.  11.Zocor 20 mg daily.  12.Foltx 1 tablet daily.  13.Tylenol if needed.   SOCIAL HISTORY:  The patient [place] in the [place] area,  is married, retired.  The patient has three children; one has hepatitis  C, one has polycythemia.   FAMILY MEDICAL HISTORY:  Notable that parents had a history of stroke in  the father.  Mother apparently is still living, he claims, at age 71.  The patient has one brother who had a stroke.  His sister is in good  health.  Sister was apparently a kidney donor for him.   REVIEW OF SYSTEMS:  Notable for some intermittent headaches.  The  patient has no reports of shortness of breath, chest pains, abdominal  pain, nausea, vomiting.  The patient does have problem with some  constipation, does have some numbness in the hands and feet, some gait  instability associated with neuropathy.   PHYSICAL EXAMINATION:  VITALS:  Blood pressure is currently 147/82,  heart rate 77, respiratory rate 16, temperature afebrile.  GENERAL:  This patient is a fairly well-developed elderly white male who  is alert, cooperative at the time  examination.  HEENT:  Head is atraumatic.  Eyes - pupils are slightly anisocoric with  the right pupil being smaller than the left, both reactive.  Disks are  flat.  NECK:  Supple.  No carotid bruits noted.  RESPIRATORY:  Clear.  CARDIOVASCULAR:  A grade 2/6 systolic ejection murmur of the aortic  area.  EXTREMITIES:  Without significant edema.  NEUROLOGIC:  Cranial nerves as above.  There is some slight ptosis of  the right eye.  The patient has good sensation of the face to pinprick,  soft touch bilaterally.  He has full extraocular movements.  Visual  fields are full.  Speech is well-enunciated.  The patient is oriented to  person, place and knows the month, not the exact day, but knows the  year.  The patient has well-enunciated speech, no aphasia.  Motor  strength appears to be fairly good on all fours.  The patient has no  drift in the upper extremities.  The patient has impaired pinprick  sensation below the knees and significant impairment of vibratory  sensation below the knees bilaterally.  Pinprick, soft touch, vibratory  sensation of the upper extremities is relatively symmetric and normal.  The patient has fair finger-nose-finger, but does have some apraxia with  use of the arms.  Good toe-to-finger noted bilaterally.  The patient has  symmetric but depressed reflexes.  Toes are neutral bilaterally.  Again,  no drift is seen.  The patient was not ambulated.   LABORATORY VALUES:  Notable for a white count of 6.3, hemoglobin of  13.2, hematocrit of 39.2, MCV of 100.3, platelets of 128.  Sodium 138,  potassium 4.7, chloride of 93, CO2 of 93, BUN of 28, creatinine of 7.64,  calcium of 9.5, phosphorus of 3.1.   IMPRESSION:  1. Transient episodes of confusion, etiology unclear.  Rule out      seizures versus transient ischemic attack.  2. End-stage renal disease on hemodialysis.  3. Organic brain syndrome.  This patient has a history of recurring episodes of confusion.   The  frequency of these events appears to be becoming a bit more prominent.  Will treat empirically at this point for seizures to see if the episodes  abate.  Will initiate Dilantin therapy 300 mg at night.  Will follow up  in the office in 6-8 weeks.  The patient seems be at or near his  baseline at this point.  The patient has no recollection of events  during these spells of confusion  and may lose 2-5 days of memory.      Marlan Palau, M.D.  Electronically Signed     CKW/MEDQ  D:  05/04/2006  T:  05/04/2006  Job:  [id number]   cc:   Maree Krabbe, M.D.  [facility]

## 2010-06-15 NOTE — Discharge Summary (Signed)
[place]  Patient:    [name], [name] Visit Number: [id number] MRN: [id number]          Service Type: [facility] Location: [id number] Attending Physician:  Herold Harms Dictated by:   Sheffield Slider, P.A.C. Admit Date:  02/10/2001 Discharge Date: 02/24/2001   CC:         Burnice Logan, M.D., Infectious Disease  Pierce Crane, M.D.  Genene Churn. Love, M.D.   Discharge Summary  DISCHARGE DIAGNOSES: 1. Progressive demyelinating polyneuropathy. 2. Renal transplant with chronic rejection. 3. Thrombocytopenia. 4. Hypothyroidism. 5. Hypertension. 6. Left lower infiltrate versus bronchitis. 7. Anemia. 8. Metabolic acidosis. 9. History of cytomegalovirus.  PROCEDURES: 1. CT scan of the back, January 30, 2001:  C-spine showed cervical degenerative    changes, no mass, lesion, or cord compression.  There is essential disk    herniation in C3-4.  Thoracic spine, normal alignment, no fracture, mass,    or lesion seen.  Cord has normal signal.  There is no cord compression,    mild degenerative disk disease is seen throughout the thoracic spine.  Mild    bulging disk at T10-11, no herniation identified and L-spine degenerative    disk disease and facet arthropathy with mild spinal stenosis at L3-4 and    L4-5, no disk herniation. 2. Plasmapheresis x5 treatments. 3. Lumbar puncture, January 29, 2001, Dr. Avie Echevaria. 4. CT scan of the abdomen and pelvis, February 04, 2001, showed no evidence    of splenomegaly or lymphadenopathy.  No other acute abdominal process    noted.  Posterior left lower infiltrate.  No acute process seen and    pelvis normal appearance of renal transplant in the right iliac fossa.  HISTORY OF PRESENT ILLNESS:  [name] is a pleasant 77 year old white male, with living related donor renal transplant since December 1998.  He was never placed on hemodialysis and has a history of chronic rejection.  Most recent creatinines have  been 4.4 on January 06, 2001, and 3.7 with a BUN of 116 on January 20, 2001.  He also has severe peripheral neuropathy related to Prograf and recurrent CMV.  The patient was seen on December 10, with leg, hand, foot, and periorbital numbness.  He was placed on Valcyte 450 mg every other day and in case CMV was reactivated.  His culture was negative and Valcyte was discontinued.  Chemistries showed a normal calcium of 8.7 with albumin 3.5.  The patient recently stopped taking Rocaltrol on his own when he read that it caused symptoms like he has been having.  Today, he presents with worsening numbness and tingling without pain and profound upper and lower extremity body weakness that has been progressive since January 24, 2001.  He has not been able to stand or walk without his knees giving out.  His home is on one level, but he has to crawl down five steps to get out of the house.  He was seen in the office today and arrangements were made for admission and further neurologic evaluation.  The remainder of the physical examination is outlined in the admission history and physical.  HOSPITAL COURSE:  #1 - PROGRESSIVE DEMYELINATING POLYNEUROPATHY:  Dr. Sandria Manly was called for consultation and immediately did an LP which showed elevated protein of 92 with a range of 15-45.  White count was zero, glucose was within normal limits.  Appearance was clear.  The patient had a myriad of laboratory studies ordered, particularly in  view of his previous history of CMV.  All were essentially negative.  SPEP showed restricted heterogenous pattern.  CMV by PCR was negative.  VVV by PCR was negative.  HSV was also negative by PCR. Cryoglobulin was negative at 72 hours, although the patient did have an elevated myoglobulin of 250 with normal being less than 111.  CMV IgM confirmed prior CMV virus and was positive at 1.94.  The patient had a heavy metal screen which was also negative.  CT scan of the spine  showed significant degenerative disk disease.  He was started on a course of plasmapheresis and received five treatments, the first being January 5, before his transfer to the rehabilitation unit.  The patient dialyzed using preexisting access.  His only problem was he had prolonged bleeding afterwards, most likely due to his uremic and inadequate platelets.  He was given DDAVP on one occasion. Physical therapy and occupational therapy worked with the patient extensively. He gradually improved.  He did fall on one occasion, when he thought he was able to get to the bathroom, but his legs were not strong enough.  This was without incident.  Rehabilitation was consulted and he was deemed a good candidate for the rehabilitation process and he was transferred there on February 10, 2001.  #2 - RENAL TRANSPLANT WITH CHRONIC REJECTION:  Laboratories drawn on admission showed a sodium of 135, potassium 4.2, chloride 108, CO2 17, glucose 151, BUN 111, creatinine 3.4, calcium 8.2.  Albumin 3.3, total protein 6.1.  Hemoglobin 11.5, hematocrit 34, white count 7.2, platelets 42,000.  Differential showed 95% neutrophils, 2% lymphocytes.  AST 29, ALT 20, alkaline phosphatase 83, total bilirubin 0.6, phosphorus 5.4.  The patients acidemia was corrected with sodium bicarbonate.  His creatinine remained in the 3 range throughout his hospitalization.  His BUN did come down to 88.  Potassium was within normal limits.  Initially, his CellCept was held.  He was placed on a course of steroids and CellCept was able to be restarted.  #3 - THROMBOCYTOPENIA:  Dr. Donnie Coffin was consulted on January 8, for persistent thrombocytopenia.  The platelets had dropped to as low as 18,000.  Colchicine had been placed on hold.  He was already on steroids.  Platelets antibody indirect assay was negative and eventually platelets increased and were 47,000  at the time of transfer to rehabilitation.  They will be watched  closely.  #4 - HYPOTHYROIDISM:  TSH was checked and found to be 10.7.  This is a new finding on this admission and he was started on replacement therapy.  #5 - HYPERTENSION:  At the time of admission, the patients blood pressure medicines included labetalol and Plendil.  Labetalol was discontinued and he was on Plendil by itself.  #6 - LEFT LOWER LOBE INFILTRATE:  This was identified on a CT scan.  The patient did have a followup chest x-ray on the 11th, which showed clearing of the infiltrate of the left base.  Bronchitic changes could be acute or chronic.  He was started on a course of Zithromax January 13, the day before his transfer to rehabilitation.  All cultures done during the hospitalization. The patient did have urine, blood, and many viral cultures.  #7 - ANEMIA:  The patients Hemoccult was negative.  He was started on a course of Epogen.  Hemoglobin was 11.5 on admission.  It had dropped to 8.9 at the time of transfer and will be monitored closely.  The patient had not been transfused at  this point.  CONDITION ON TRANSFER:  Improved.  DISPOSITION:  The patient will continue current medications and current diet and receive intensive rehabilitation therapy at the [facility]. Dictated by:   Sheffield Slider, P.A.C. Attending Physician:  Herold Harms DD:  04/13/01 TD:  04/14/01 Job: [id number]

## 2010-06-15 NOTE — Procedures (Signed)
EEG NUMBER:  [id number].   REQUESTING PHYSICIAN:  Marolyn Hammock. Thad Ranger, M.D.   ATTENDING PHYSICIAN:  Duke Salvia. Eliott Nine, M.D.   CLINICAL HISTORY:  This is a portable EEG done without photic  stimulation or hyperventilation.  The patient is described as awake and  confused.  This is a 77 year old male with episodic alteration of  consciousness.  EEG is performed for evaluation.  EEG performed  yesterday demonstrated borderline slowing of the background rhythms but  no focal abnormalities.   DESCRIPTION:  The background rhythm seen in this study consists of mixed-  amplitude theta rhythms of 4-7 Hz, which appear diffusely without  abnormal asymmetry.  No persistent focal slowing is noted.  No  epileptiform discharges are seen.  No architecture of sleep is noted.  Abundant muscle artifact appears about throughout the segments of the  study, but the study is felt to be readable.  Photic stimulation and  hyperventilation are not performed.  Single channel devoted to EKG  revealed sinus tachycardia throughout at the rate of approximately 108  beats per minute.   CONCLUSION:  Abnormal study due to the presence of mild to moderate  diffuse slowing and disorganization of the background rhythms with  findings suggestive of diffuse widespread cerebral dysfunction and  consistent with the clinical history of an encephalopathic and/or  demented state.  No focal slowing is noted and no epileptiform  discharges are seen.  Slowing is more severe than that seen in the study  from yesterday but, once again, no focal or epileptiform abnormalities  are present.      Michael L. Thad Ranger, M.D.  Electronically Signed     RJJ:OACZ  D:  02/15/2006 14:51:19  T:  02/15/2006 15:10:41  Job #:  [id number]

## 2010-06-15 NOTE — Discharge Summary (Signed)
NAME:  [name], [name]                          ACCOUNT NO.:  [id number]   MEDICAL RECORD NO.:  [id number]                   PATIENT TYPE:  INP   LOCATION:  4737                                 FACILITY:  [facility]   PHYSICIAN:  Aram Beecham B. Eliott Nine, M.D.             DATE OF BIRTH:  [birth year]   DATE OF ADMISSION:  06/28/2002  DATE OF DISCHARGE:  07/03/2002                                 DISCHARGE SUMMARY   ADMISSION DIAGNOSES:  1. Febrile illness.  2. Elevated CPK.  3. End-stage renal disease, chronic hemodialysis.  4. Hypertension.  5. Idiopathic thrombocytopenic purpura.  6. History of pulmonary embolism.  7. Secondary hyperparathyroidism.   DISCHARGE DIAGNOSES:  1. Febrile illness, culture negative.  2. Idiopathic thrombocytopenic purpura.  3. End-stage renal disease, chronic hemodialysis.  4. Secondary hyperparathyroidism.  5. History of pulmonary embolism on Coumadin.  6. Anemia.   BRIEF HISTORY/REASON FOR ADMISSION:  [name] is a 77 year old white male  with end-stage renal disease, status post renal rejection.  He lost his  kidney April 2003, started on hemodialysis at that time.  Recently had  pulmonary embolism, started on Coumadin 3/04 and also had a nephrectomy at  that time.  History of ITP.  He was seen in the office on the day of  admission and was sent to the hospital with chills, weakness, and febrile  illness.  First, he was sent to [facility] ER where he was noted to have a  systolic blood pressure in the 60s and pulse oximetry of 85%.  He was  transferred to [facility] for further management and evaluation after his  blood pressure stabilized.   When he got to the ER he told Dr. Arrie Aran he felt great.  He did report  to have fallen on Saturday hurting his ribs.  He said he had taken a  sleeping pill while T.V. and fell in the dark.   On exam performed by Dr. Arrie Aran, he had a temperature of 97.8, blood  pressure 112/50, pulse oximetry of 97% on room  air.  Minimal bibasilar  crackles, right more than left.  Left arm AV fistula.  No fluctuance, no  signs of infection.  Chest x-ray showed no infiltrates.  He did note to have  elevated CPK of 2956 with MB portion of 1.5.  His INR was 2.3.  Troponin was  0.2.  Potassium was 3.8, BUN of 14, creatinine of 5.1.  Serum glucose was  121.  Dr. Arrie Aran admitted the patient on the floor as reported with  elevated temperature, elevated CPK.  It was thought to be secondary to  trauma but was to rule out MI with repeat CPK's and troponin's.  His  hypotension had resolved and he was hemodynamically stable on the floor and  we will monitor this closely.  His INR was therapeutic with his recent  pulmonary embolism and we will continue Coumadin.  Also,  we will continue  hemodialysis Monday, Wednesday, and Friday and as needed.   HOSPITAL COURSE:  Problem 1:  FEBRILE ILLNESS:  The patient's temperature  went back up to 100.8 during the evening of his first admission.  Etiology  was not totally clear, as he had six sets of blood cultures at [facility] which were all negative at time of discharge.  He was placed on  vancomycin, TobraDex, and Ancef.  Blood cultures were negative at time of  discharge.  His IV antibiotics were stopped and his fevers resolved off of  IV antibiotics.  No source was reportedly identified at the time of this  dictation.  His temperature over the last 48 hours was 99.7.  CT scan of his  abdomen and pelvis showed no acute abnormalities in the pelvis, sigmoid  diverticulosis, no diverticulitis, no abnormalities in his abdomen acutely,  aortic atherosclerosis and no aneurysm noted and atrophic kidneys  bilaterally containing several cysts.  No acute changes since January 2003  with a cyst in his lower pole of the left kidney hemorrhagic.  Reportedly  did have mild bibasilar lung atelectasis.  White count normal throughout  hospital stay.  His rheumatoid factor was less  than 20.   Problem 2:  END-STAGE RENAL DISEASE:  The patient continued hemodialysis on  Monday, Wednesday, and Friday schedule.  He will have followup at [facility] on Monday, Wednesday, and Friday and had no significant change  in his medications.   Problem 3:  HISTORY OF PULMONARY EMBOLISM:  The patient continues on  Coumadin at time of his hospital stay and his INR was therapeutic.  At time  of discharge, he was on 4 mg daily Coumadin.   Problem 4:  IDIOPATHIC THROMBOCYTOPENIC PURPURA:  The patient was continued  also on his prednisone low dose during this hospital stay.   Problem 5:  HISTORY OF TRANSPLANT NEPHRECTOMY MARCH 2004 WITH HISTORY OF  CYTOMEGALOVIRUS:  The patient again had broad-spectrum antibiotics during  this hospital stay, improved off antibiotics.  ID was consulted and on June  3 ID recommended stopping antibiotics.  He would need to have followup on  his CMV IgM titer and antigen as an outpatient.  Also of note, he had an  echocardiogram showing no vegetation on June 1.     Donald Pore, P.A.                    Duke Salvia Eliott Nine, M.D.    DWZ/MEDQ  D:  08/04/2002  T:  08/05/2002  Job:  [id number]   cc:   [facility]    cc:   [facility]

## 2010-06-15 NOTE — Discharge Summary (Signed)
   NAME:  DANIELLE, LENTO                          ACCOUNT NO.:  [id number]   MEDICAL RECORD NO.:  [id number]                   PATIENT TYPE:  INP   LOCATION:  4737                                 FACILITY:  [facility]   PHYSICIAN:  Aram Beecham B. Eliott Nine, M.D.             DATE OF BIRTH:  [birth year]   DATE OF ADMISSION:  06/28/2002  DATE OF DISCHARGE:  07/03/2002                                 DISCHARGE SUMMARY   ADDENDUM   At the time of discharge, Maree Krabbe, M.D. had said no change in his  home medications which were Neurontin 200 mg daily, Protonix 40 mg daily,  prednisone as directed with his tapering dose, Colchicine 0.6 mg daily,  Coumadin 4 mg daily, calcium carbonate 500 mg one a.c., Synthroid 75 mcg  daily, Nephro-Vite one daily.     Donald Pore, P.A.                    Duke Salvia Eliott Nine, M.D.    DWZ/MEDQ  D:  08/04/2002  T:  08/05/2002  Job:  [id number]   cc:   [facility]    cc:   [facility]

## 2010-06-15 NOTE — Consult Note (Signed)
NAME:  [name], [name] NO.:  [id number]   MEDICAL RECORD NO.:  [id number]                   PATIENT TYPE:  INP   LOCATION:  2927                                 FACILITY:  [facility]   PHYSICIAN:  Genene Churn. Love, M.D.                 DATE OF BIRTH:  Oct 26, 1926   DATE OF CONSULTATION:  06/17/2003  DATE OF DISCHARGE:                                   CONSULTATION   REASON FOR CONSULTATION:  This 77 year old right-handed white married male  from [place], [place] has a known history of end-stage renal disease  and is currently on hemodialysis on Mondays, Wednesdays, and Fridays in  [place].  He has a known history of peripheral neuropathy for which I have  been following him and yesterday was confused and was admitted Jun 17, 2003.   HISTORY OF PRESENT ILLNESS:  [name] has had end-stage renal disease  requiring dialysis on Mondays, Wednesdays, and Fridays in [place].  There  was a prior history of a cadaveric renal transplant in December 1998 at  which time he received immunomodulation therapy and developed an axonal  neuropathy.  This improved.  He subsequently developed CMB with colitis and  a demyelinating neuropathy requiring plasmapheresis on six occasions with  marked improvement in his neuropathy.   He was in his usual state of health until February of 2005 when he again  developed symptoms of neuropathy with EMG and nerve conduction studies most  indicative of a demyelinating greater than axonal neuropathy.  He underwent  a total of seven courses of plasmapheresis without improvement.   This past Saturday, Jun 11, 2003, he developed the acute onset of confusion  and began having headaches.  He was seen by myself on Jun 16, 2003 in the  office with confusion, nonfocal examination, and blood pressure in the right  arm of 200/80.  Subsequently, he was seen by Dr. Pierce Crane who noted a  blood pressure of 220/110. He was admitted for further  evaluation.   PAST MEDICAL HISTORY:  1. Significant for end-stage renal disease.  2. 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  3. Peripheral neuropathy x 3.  4. Anemia.  5. Hyperlipidemia.  6. Benign prostatic hypertrophy.  7. Depression.  8. Status post failure of renal transplant in December of 1998 with     transplant nephrectomy in 2004, CMV colitis, and CMV Guillain-Barre     syndrome in 2003, and hypothyroidism.   DISCHARGE MEDICATIONS:  1. Diltiazem 180 mg q.d.  2. Colchicine 0.6 mg q.d.  3. Synthroid 75 mcg q.d.  4. Nephro-Vite 1 q.d.  5. Ultra Tums 3 q.d.   HISTORY OF PRESENT ILLNESS:  Further information this day in dialysis, the  patient developed an episode of right -sided numbness and perioral numbness  with confusion and neurology was called.   PHYSICAL EXAMINATION:  GENERAL:  Well-developed, thin white male.  VITAL SIGNS:  Blood pressure 200/90 but greater at 210/110.  Heart rate was  64.  NECK:  There were no carotid bruits.  He had a left supraclavicular bruit  heard.  Neck flexion was unremarkable.  NEUROLOGICAL:  He was alert. He would look at the examiner.  He would  occasionally follow one-step commands but this improved subsequently.  His  visual fields were full to scare.  Both disks were seen and flat.  The  extraocular movements were full and corneals were present.  There was no  focal asymmetry.  He had 2+ reflexes and even had ankle jerks.  They were  depressed but present and plantar responses were downgoing.  The left pupil  was larger than the right and there was a ptosis suggestive of a Horner  syndrome.   LABORATORY DATA:  MRI from Jun 16, 2003 was reviewed and showing no acute  stroke, some old bilateral ischemic strokes including the pons.  MRA showed  evidence of a right middle cerebral artery disease but otherwise looked very  good.   DESCRIPTION OF PROCEDURE:  The patient was prepped and draped in the  left  lateral decubitus position using Betadine and 1% Xylocaine.  The L4 and L5  interspace was entered without difficulty.  Opening pressure was 60 mmHg of  clear fluid was obtained and sent for studies.   IMPRESSION:  Suspect hypertensive encephalopathy, code 348.3.   PLAN:  Plan at this time is to review CSF results.  Obtain an EEG and  consider repeat dialysis today.                                               Genene Churn. Sandria Manly, M.D.    JML/MEDQ  D:  06/17/2003  T:  06/18/2003  Job:  [id number]

## 2010-06-15 NOTE — Discharge Summary (Signed)
NAME:  [name], [name] NO.:  [id number]   MEDICAL RECORD NO.:  [id number]                   PATIENT TYPE:  INP   LOCATION:  2927                                 FACILITY:  [facility]   PHYSICIAN:  Tawny Asal, MD             DATE OF BIRTH:  1927/01/24   DATE OF ADMISSION:  06/16/2003  DATE OF DISCHARGE:                                 DISCHARGE SUMMARY   No dictation.                                                Tawny Asal, MD    CW/MEDQ  D:  06/16/2003  T:  06/18/2003  Job:  [id number]

## 2010-06-15 NOTE — Discharge Summary (Signed)
[facility]  Patient:    [name], [name] Visit Number: [id number] MRN: [id number]          Service Type: MED Location: [phone] Attending Physician:  Lurlean Nanny Dictated by:   Donald Pore, P.A. Admit Date:  07/02/2001 Discharge Date: 07/08/2001   CC:         [facility]  [facility], [place], [place]   Discharge Summary  ADMISSION DIAGNOSES:  1. 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  2. Shortness of breath and dyspnea on exertion secondary to #1.  3. Anemia of chronic disease.  4. Left lower extremity weakness with history of Guillain-Barre type     syndrome.  5. Gouty arthritis.  6. History of cytomegalovirus.  7. History of hypothyroidism.  8. Hypertension.  9. Recent Proteus urinary tract infection. 10. Idiopathic thrombocytopenia.  DISCHARGE DIAGNOSES: 1. Chronic renal failure with transplant rejection and initiation of    hemodialysis. 2. Staphylococcus species coag negative one of two blood cultures, positive    with Staphylococcus species coagulase negative. 3. Urinary tract infection with more than 100,000 colonies per mL of    multiple species present. 4. Lower extremity weakness with history of Guillain-Barre syndrome. 5. Hypothyroidism. 6. Hypertension. 7. Gouty arthritis. 8. Anemia of chronic disease. 9. Thrombocytopenia.  BRIEF HISTORY AND REASON FOR ADMISSION:  The patient is a 77 year old white male with living related donor renal transplant since December 1998, with history of chronic rejection with creatinine of around 4 followed by Fayrene Fearing L. Deterding, M.D., in the office.  On presentation, he presents with progressive weakness, unable to bear weight with his left leg because it gives out.  On the day of presentation, he actually had to crawl out of his care because he was so weak.  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  Followed on January 2003, admission by Genene Churn. Love, M.D., and Pierce Crane, M.D., respectively.  Dr. Ninetta Lights has followed the patients CMV in the past.  He was last seen in Dr. Heloise Beecham office on Jun 17, 2001, put back on his CellCept to preserve his renal function and suppress his ITP.  He reports progressive weakness since that time.  He came to the ER for evaluation and admission.  PHYSICAL EXAMINATION:  GENERAL APPEARANCE:  He appeared chronically ill with somewhat uremic type odor.  VITAL SIGNS:  On presentation, blood pressure was 124/57.  He was afebrile. Pulse was 70s and regular.  LUNGS:  Lungs revealed few crackles in the right base.  CARDIOVASCULAR:  No rub was heard on cardiac exam.  ABDOMEN:  Bowel sounds positive and normal.  It was nontender.  NEUROLOGIC/MUSCULOSKELETAL:  He had equal and symmetric strength throughout upper and lower extremities.  He moved all extremities well with Dallas Breeding examination but did have right wrist and fingers painful and swollen to palpation.  Significant pain in left popliteal and hamstring area.  No erythema or swelling noted.  Arthritic changes noted in this knees.  PLAN:  He was admitted by Cecille Aver, M.D., admitting doctor with decreased mobility, strongly thought that his deconditioning was secondary to progressive renal transplant rejection versus viral syndrome.  Hemodialysis may be needed, which the patient has never had before. The patient and wife agreed to proceed with hemodialysis if needed.  Physical therapy was consulted to help in his  lower extremity weakness.  LABS ON ADMISSION:  BUN 102, creatinine 4.8, potassium 3.2. Hemoglobin 9.1, white count 7.5, platelets 42.  He does have a known history of ITP, last platelets as an outpatient were 43,000.  HOSPITAL  COURSE:  #1 - RENAL FAILURE:  The patient was noted to be uremic with BUN over 100, creatinine was only 4.8.  Renal ultrasound was done to rule out acute changes. It was noted that there was no hydronephrosis noted and a transplanted kidney measured 10.7 cm with Doppler flow showing arterial perfusion and patent venous outflow.  Chest x-ray showed lungs are clear and cardiomegaly, no effusions noted.  Dr. Kathrene Bongo placed the patient on hemodialysis on July 04, 2001, and noted by July 05, 2001, the patient noted he was feeling better after his first hemodialysis.  Outpatient hemodialysis arrangements were made in [place], [place], after the patient again had significant improvement with second hemodialysis.  He had three hemodialysis total in the hospital.  He symptomatically improved with hemodialysis.  He was dialyzed using a left lower arm AV fistula and at the time of discharge, will be in the [facility] with labs to be followed up.  #2 - STAPH BACTEREMIA:  The patient is noted to spike a temperature of 101.2 on July 06, 2001, p.m.  This showed he had one of two blood cultures positive for Staphylococcus species coagulase negative.  As discussed with Wilber Bihari. Fox, M.D., on discharge, he will continue vancomycin for 10 more days.  Also noted that he had more than 100,000 colonies per mL of multiple species of urine; however, he had no urinary symptoms and was discharged home on Cipro 250 mg q.d. for 14 days by Dr. Jearl Klinefelter order.  He will have follow-up cultures as an outpatient.  Again, renal ultrasound showed no hydronephrosis and no evidence of abscess/infection on ultrasound.  #3 - LOWER EXTREMITY WEAKNESS:  The patients weakness improved on admission with this thought to be related to uremia.  Will continue to have outpatient physical therapy.  #4 - ANEMIA/THROMBOCYTOPENIA:  The patient hemoglobin on admission was 9.1.  At the time of discharge it was 9.6.  He was  placed on Epogen 12,500 units IV q. hemodialysis and iron studies showed that his ferritin level was 1532 and his transferrin saturation was 71%, therefore, no IV InfeD started. Hemoglobins will be followed at the dialysis unit.  Hemoglobin was 9.6 before discharge and stable.  #5 - HYPOTHYROIDISM:  The patient has known history of hypothyroidism.  He was continued on Synthroid during hospital stay at 75 mcg q.d.  Thyroid studies will be checked at the outpatient dialysis unit.  #6 - GOUTY ARTHRITIS:  The patient was continued on allopurinol and colchicine as an outpatient.  Noted that as an outpatient, he had had low platelet counts which were stable and because of significant symptomatic gout arthritis in the setting of his other lower extremity weakness etiologies; i.e., Guillain-Barre type syndrome, it was felt to continue his gout medications.  His allopurinol was held during hospitalization but continued at the time of discharge.  He was also on prednisone 10 mg q.d.  #7 - HYPERTENSION:  At the time of discharge, all hypertensive medications were stopped.  Blood pressure was controlled using volume removal on hemodialysis.  DISCHARGE MEDICATIONS:  1. Nephro-Vite one q.d.  2. Calcium carbonate 500 mg one a.c.  3. Prilosec 20 mg q.h.s.  4. Colchicine 0.6 mg q.d.  5. Allopurinol 100 mg one q.d.  6. Prednisone 10 mg q.d.  7. CellCept 250 mg one q.12h.  8. Synthroid 75 mcg q.d.  9. Zocor 40 mg q.d. 10. Cipro 500 mg one half tablet q.d. x14 days total. 11. Vancomycin 750 mg q.d. hemodialysis, last dose July 18, 2001. 12. Epogen 12,500 units IV q. hemodialysis. 13. Calcijex 1.0 mcg IV q. hemodialysis.  Dry weight was 64.5 kg and to be adjusted at the outpatient unit.  FOLLOW-UP:  He will follow up at the [facility] Tuesday, Thursday, Saturday schedule. Dictated by:   Donald Pore, P.A. Attending Physician:  Lurlean Nanny DD:  08/12/01 TD:   08/17/01 Job: [id number]

## 2010-06-15 NOTE — Procedures (Signed)
CLINICAL HISTORY:  The patient is a 77 year old male with hypertension,  altered mental status, status post renal transplant.  The patient has had  severe peripheral neuropathy in the past.  The study is being done to look  for the presence of seizure activity and also at the background for  prognostic purposes.   PROCEDURE:  The tracing is carried out on a 32-channel digital Cadwell  recorder reformatted into 16-channel montages with one devoted to EKG.  The  patient was awake and asleep during the recording.  Medications include  Epogen, Catapres, Restoril, hydrocodone, Cardizem, Nephro-Vite, calcium  carbonate, Protonix, Synthroid, colchicine.  The internal 10-20 system lead  placement was used.   DESCRIPTION OF FINDINGS:  The dominant frequency is a 6 Hz 35 mV activity  that is broadly distributed and attenuates poorly with eye opening.   Background activity consists of mixed frequency, rhythmic, lower theta/upper  delta range activity that is broadly distributed.  The record is  predominantly theta and does not change significantly throughout.  On  occasion with arousal there may be up to 7 Hz theta range activity and less  delta.  However, there was no other significant change.  There was no focal  slowing in the background.  Highest voltages were in the central regions  which was also the area of most prominent delta range activity.   There was no focal slowing.  There was no interictal epileptiform activity  in the form of spikes or sharp waves.  EKG showed a regular sinus rhythm  with ventricular response of 63 beats per minute.   IMPRESSION:  Abnormal EEG on the basis of diffuse background slowing.  This  is a nonspecific indicator of neuronal dysfunction that may be on a primary  degenerative basis or in this case more likely related to his toxic  metabolic disorder.    WILLIAM H. Sharene Skeans, M.D.   [id number]  D:  06/18/2003 02:29:01  T:  06/18/2003 09:27:32  Job #:   [id number]   cc:   Genene Churn. Love, M.D.  [address]  [address]  [place]  [place] [address]  Fax: [phone]

## 2010-10-22 ENCOUNTER — Telehealth: Payer: Self-pay | Admitting: *Deleted

## 2010-10-22 NOTE — Telephone Encounter (Signed)
Received a fax from Tommye Standard at [facility] in [place] requesting an appt for pt. Pt's H&H has been slowly dropping since 09/19/10 and he has + stool cards. Last OV with Dr Juanda Chance was 08/17/2009 to discuss COLON, but it was determined he was too high a risk for prep and conscious sedation d/t aortic stenosis. Pt with hx of adenomatous polyp in '91, '92, '96 and a polyp in 2001 with CMV colitis. I spoke with wife and offered pt an appt Friday, 10/26/10 with Dr Juanda Chance after his dialysis. Pt has Dialysis on [address] and he usually uses [facility] transportation. Wife stated she could bring pt for an appt, but pt is expecting company from [facility] Thursday and she doesn't think he should come. I explained to wife this is all I have until 11/21/10 and she implied she was willing to wait that long. I offered pt an appt eith an extender this week and she asked to wait until next week. Informed Mrs Challis I will call her Friday or Monday when next week's schedule comes out and I'll call her; wife stated understanding; notified Rosalita Chessman at Dialysis.

## 2010-10-26 ENCOUNTER — Encounter: Payer: Self-pay | Admitting: *Deleted

## 2010-10-26 NOTE — Telephone Encounter (Signed)
SCHEDULED PATIENT ON WITH PAULA ON 10/30/10 AT 8:30 am

## 2010-10-30 ENCOUNTER — Other Ambulatory Visit (INDEPENDENT_AMBULATORY_CARE_PROVIDER_SITE_OTHER): Payer: Medicare Other

## 2010-10-30 ENCOUNTER — Encounter: Payer: Self-pay | Admitting: Nurse Practitioner

## 2010-10-30 ENCOUNTER — Ambulatory Visit (INDEPENDENT_AMBULATORY_CARE_PROVIDER_SITE_OTHER): Payer: Medicare Other | Admitting: Nurse Practitioner

## 2010-10-30 DIAGNOSIS — Z860100 Personal history of colon polyps, unspecified: Secondary | ICD-10-CM

## 2010-10-30 DIAGNOSIS — D638 Anemia in other chronic diseases classified elsewhere: Secondary | ICD-10-CM

## 2010-10-30 DIAGNOSIS — K648 Other hemorrhoids: Secondary | ICD-10-CM

## 2010-10-30 DIAGNOSIS — K644 Residual hemorrhoidal skin tags: Secondary | ICD-10-CM

## 2010-10-30 LAB — CBC WITH DIFFERENTIAL/PLATELET
Eosinophils Relative: 2 % (ref 0.0–5.0)
HCT: 30.3 % — ABNORMAL LOW (ref 39.0–52.0)
Hemoglobin: 10.1 g/dL — ABNORMAL LOW (ref 13.0–17.0)
MCHC: 33.4 g/dL (ref 30.0–36.0)
Monocytes Absolute: 0.4 10*3/uL (ref 0.1–1.0)
Monocytes Relative: 10.1 % (ref 3.0–12.0)
Neutrophils Relative %: 53.3 % (ref 43.0–77.0)
RBC: 2.92 Mil/uL — ABNORMAL LOW (ref 4.22–5.81)
RDW: 18.7 % — ABNORMAL HIGH (ref 11.5–14.6)

## 2010-10-30 MED ORDER — HYDROCORTISONE ACETATE 25 MG RE SUPP: RECTAL | Status: DC

## 2010-10-30 NOTE — Progress Notes (Signed)
[name] [id number] 1926-11-05   HISTORY OR PRESENT ILLNESS :Patient is an 77 year old male with multiple medical problems. He is on hemodialysis for end-stage renal disease after a failed kidney transplant. He has a history of ITP, aortic stenosis, and Guillan- Barr syndrome. In 2001 patient was hospitalized with CMV colitis. Patient was seen by Dr Juanda Chance in July 2011 to discuss screening colonoscopy but it was felt that patietn was too high risk for the procedure. He comes in todau for evaluation of anemia. Hemoglobin has been drifting over last few weeks. No overt bleeding. No abdominal pain   Current Medications, Allergies, Past Medical History, Past Surgical History, Family History and Social History were reviewed in [employer] record.   PHYSICAL EXAMINATION : General: Well developed  male in no acute distress Head: Normocephalic and atraumatic Eyes:  sclerae anicteric,conjunctive pink. Ears: Normal auditory acuity Mouth: No deformity or lesions Neck: Supple, no masses.  Lungs: Clear throughout to auscultation Heart: Regular rate and rhythm; no murmurs heard Abdomen: Soft, nondistended, nontender. No masses or hepatomegaly noted. Normal bowel sounds Rectal: Small external hemorrhoids. Light brown heme-negative stool. Musculoskeletal: Symmetrical with no gross deformities  Skin: No lesions on visible extremities Extremities: No edema or deformities noted Neurological: Alert oriented x 4, grossly nonfocal Cervical Nodes:  No significant cervical adenopathy Psychological:  Alert and cooperative. Normal mood and affect  ASSESSMENT AND PLAN :

## 2010-10-30 NOTE — Patient Instructions (Signed)
We have scheduled the Barium Enema at [facility]. Go to the Radiology department on the 1st floor and pick up the prep.   We have given you a paper with the date and time.  Also we sent a prescription for the suppositories to your pharmacy.

## 2010-10-30 NOTE — Assessment & Plan Note (Addendum)
Patient has a history of adenomatous polyps1991, 1992 and 77.  We decided last year that he is at very high risk for conscious sedation. Refer to "anemia of chronic disease"  .

## 2010-10-30 NOTE — Assessment & Plan Note (Signed)
Trial of Anusol-HC suppositories at night for one week.

## 2010-10-30 NOTE — Assessment & Plan Note (Addendum)
I reviewed labs from the dialysis center and his hemoglobin has declined over last several weeks from 11.1 on  August 23 to 8.7 on September 13th. For unclear reasons hemoglobin increased to 9.8 on September 20th. The patient was heme negative on exam today but recent stool cards apparently positive elsewhere which may have been secondary to hemorrhoids.  He has no family history of colon cancer. No overt bleeding, no GI complaints. Last year patient we evaluated patient for screening colonoscopy which was not done as risks seemed to outweigh the benefits and iron studies did not suggest iron deficiency. Patient still high risk for procedures but we discussed doing a barium enema to exclude any gross colon lesions. Patient agrees to proceed with barium enema. Will call him with test results and further recommendations.

## 2010-11-01 ENCOUNTER — Encounter: Payer: Self-pay | Admitting: Nurse Practitioner

## 2010-11-01 ENCOUNTER — Other Ambulatory Visit (HOSPITAL_COMMUNITY): Payer: Medicare Other

## 2010-11-04 NOTE — Progress Notes (Signed)
Reviewed and agree.

## 2010-11-05 ENCOUNTER — Telehealth: Payer: Self-pay | Admitting: Internal Medicine

## 2010-11-05 NOTE — Telephone Encounter (Signed)
Spoke with radiology at [facility], pt can take a laxative that works for him the night before-Miralax preferred by dialysis. Pt to take dulcolax supp the morning of procedure. Ashley at dialysis center to notify pt.

## 2010-11-08 ENCOUNTER — Ambulatory Visit (HOSPITAL_COMMUNITY)
Admission: RE | Admit: 2010-11-08 | Discharge: 2010-11-08 | Disposition: A | Discharge status: Home / Self Care | Payer: Medicare Other | Source: Ambulatory Visit | Attending: Nurse Practitioner | Admitting: Nurse Practitioner

## 2010-11-08 DIAGNOSIS — K648 Other hemorrhoids: Secondary | ICD-10-CM

## 2010-11-08 DIAGNOSIS — D638 Anemia in other chronic diseases classified elsewhere: Secondary | ICD-10-CM

## 2010-11-08 DIAGNOSIS — Z86010 Personal history of colonic polyps: Secondary | ICD-10-CM

## 2010-11-08 DIAGNOSIS — Z940 Kidney transplant status: Secondary | ICD-10-CM | POA: Insufficient documentation

## 2010-11-08 DIAGNOSIS — R195 Other fecal abnormalities: Secondary | ICD-10-CM | POA: Insufficient documentation

## 2010-11-08 DIAGNOSIS — K5730 Diverticulosis of large intestine without perforation or abscess without bleeding: Secondary | ICD-10-CM | POA: Insufficient documentation

## 2010-11-08 DIAGNOSIS — K644 Residual hemorrhoidal skin tags: Secondary | ICD-10-CM

## 2010-11-12 ENCOUNTER — Telehealth: Payer: Self-pay | Admitting: *Deleted

## 2010-11-12 NOTE — Telephone Encounter (Signed)
Message copied by Daphine Deutscher on Mon Nov 12, 2010 11:35 AM ------      Message from: Meredith Pel      Created: Sun Nov 11, 2010  4:06 PM       Rene Kocher, will you let patient know that his hemoglobin is actually higher now. Thanks

## 2010-11-12 NOTE — Telephone Encounter (Signed)
Spoke with patient's wife and gave her the labs results and barium enema results as per Willette Cluster, NP.

## 2010-12-04 ENCOUNTER — Ambulatory Visit: Payer: Self-pay | Admitting: Internal Medicine

## 2011-02-20 ENCOUNTER — Telehealth: Payer: Self-pay | Admitting: Nurse Practitioner

## 2011-02-20 NOTE — Telephone Encounter (Signed)
Faxed 3 paged Office Note from 11/03/2010 to [facility] on 12/10/2010

## 2012-07-03 IMAGING — RF DG BE W/ CM - WO/W KUB
13 series · 13 of 13 positions shown · IV contrast (agent unspecified)
Comparison: CT 12/29/2008

CLINICAL DATA: Study.  Hemoccult positive stool.  History of kidney
transplant.

SINGLE CONTRAST BARIUM ENEMA
Fluoroscopy Time: 1.5-minute
Contrast: Thin barium

[Series 1: run · 1 of 1 slices shown (1 of 13)]
[im 1/1]
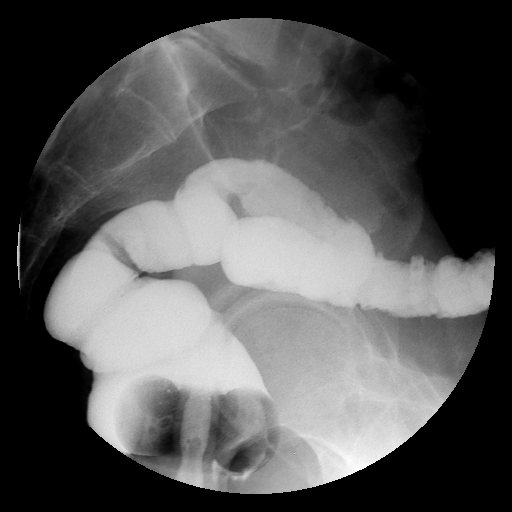

[Series 2: run · 1 of 1 slices shown (2 of 13)]
[im 1/1]
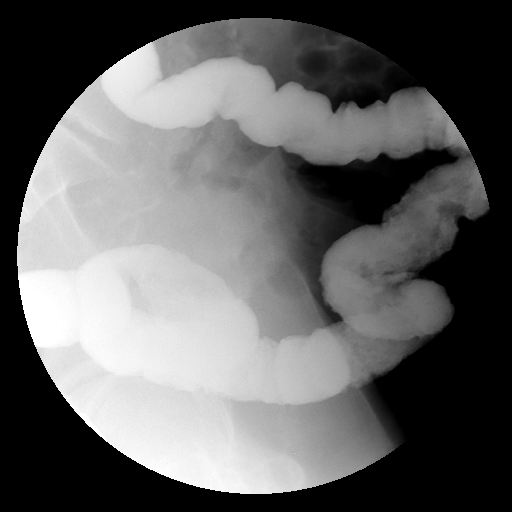

[Series 3: run · 1 of 1 slices shown (3 of 13)]
[im 1/1]
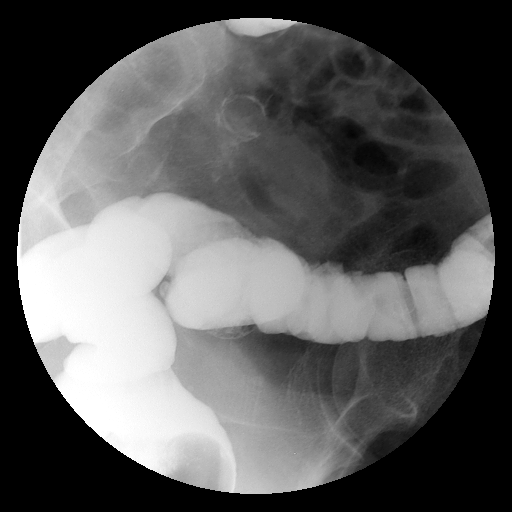

[Series 4: run · 1 of 1 slices shown (4 of 13)]
[im 1/1]
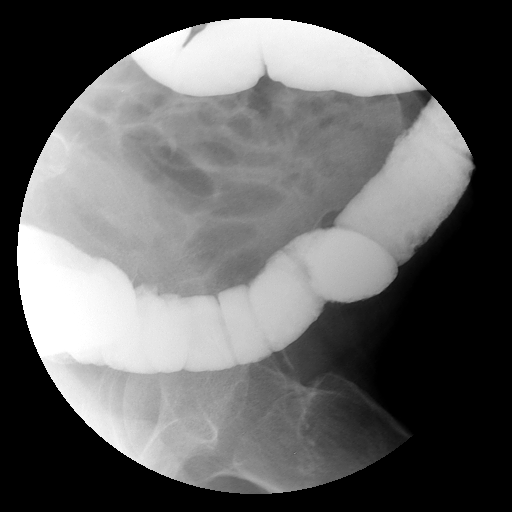

[Series 5: run · 1 of 1 slices shown (5 of 13)]
[im 1/1]
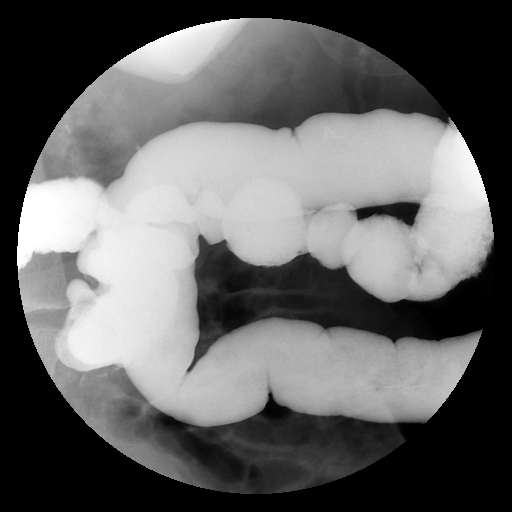

[Series 6: run · 1 of 1 slices shown (6 of 13)]
[im 1/1]
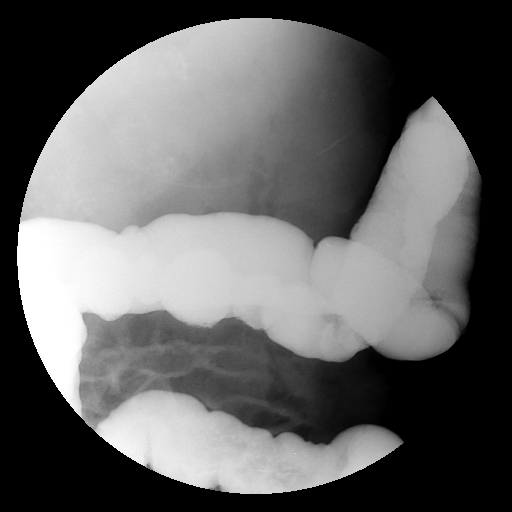

[Series 7: run · 1 of 1 slices shown (7 of 13)]
[im 1/1]
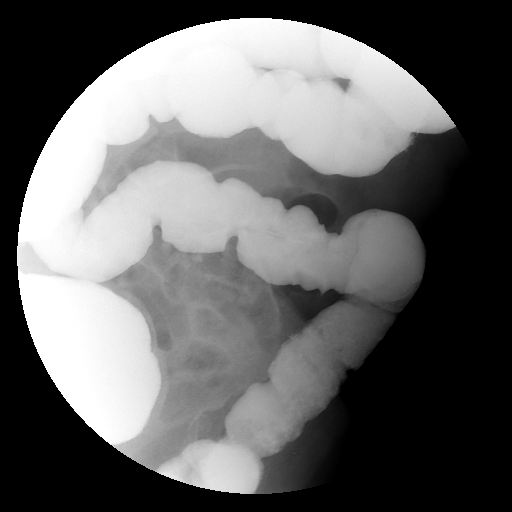

[Series 8: run · 1 of 1 slices shown (8 of 13)]
[im 1/1]
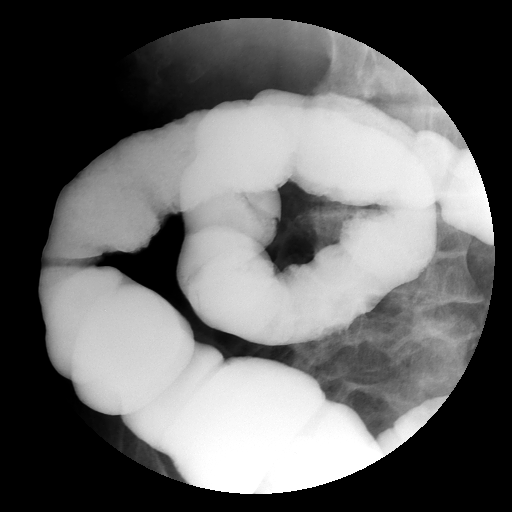

[Series 9: run · 1 of 1 slices shown (9 of 13)]
[im 1/1]
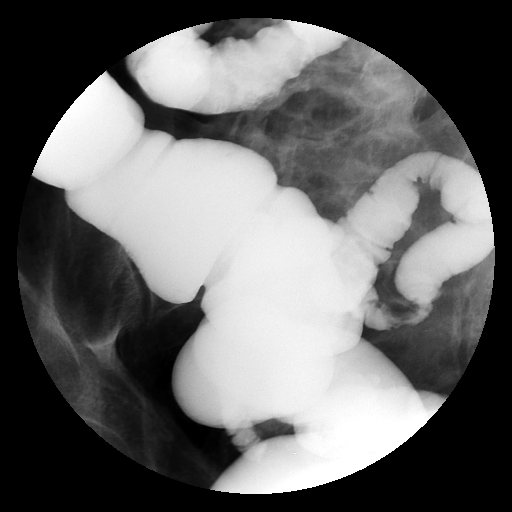

[Series 10: run · 1 of 1 slices shown (10 of 13)]
[im 1/1]
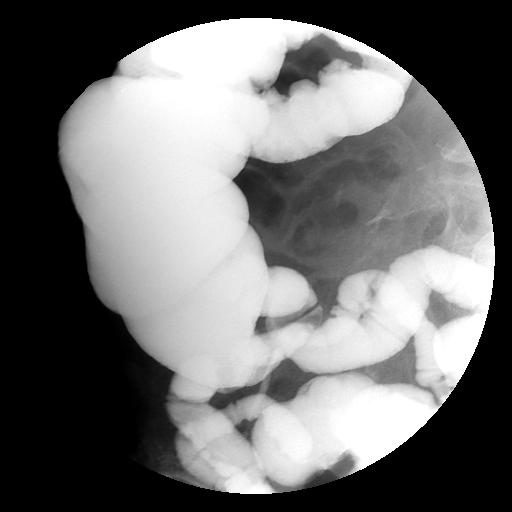

[Series 11: run · 1 of 1 slices shown (11 of 13)]
[im 1/1]
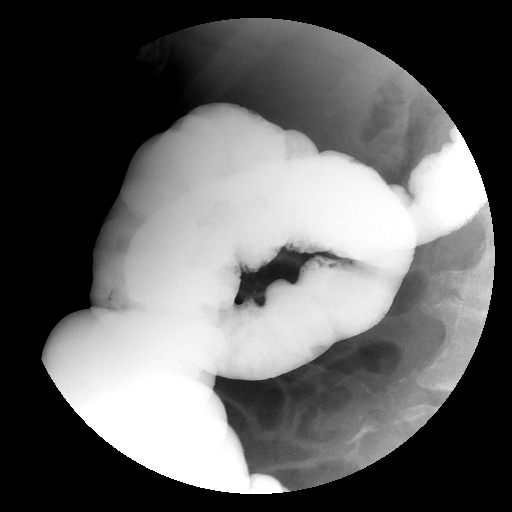

[Series 12: run · 1 of 1 slices shown (12 of 13)]
[im 1/1]
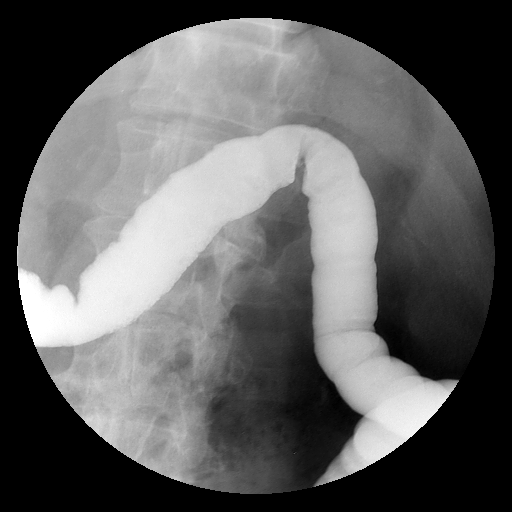

[Series 13: run · 1 of 1 slices shown (13 of 13)]
[im 1/1]
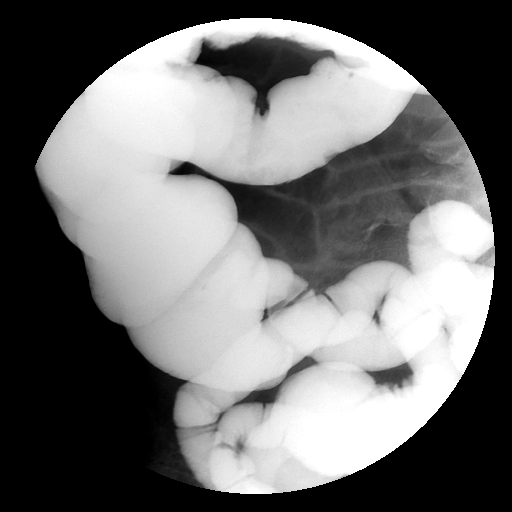

[13 of 13 positions shown; findings below may reference images not displayed]

FINDINGS: Preliminary image of the abdomen reveals normal bowel gas
pattern.  Degenerative change in the lumbar spine.  Atherosclerotic
aorta.  No acute bony abnormality.

The colon is filled with contrast.  No obstruction is identified.
There is reflux of barium into the terminal ileum.

Several diverticula in the sigmoid colon.  No evidence of acute or
chronic diverticulitis.  Negative for mass lesion.  Negative for
stricture.
IMPRESSION: Sigmoid diverticulosis, mild.  Negative for neoplasm.

## 2012-08-18 ENCOUNTER — Ambulatory Visit: Payer: Self-pay | Admitting: Neurology

## 2012-08-18 ENCOUNTER — Encounter: Payer: Self-pay | Admitting: Neurology

## 2012-08-18 DIAGNOSIS — G63 Polyneuropathy in diseases classified elsewhere: Secondary | ICD-10-CM | POA: Insufficient documentation

## 2012-08-18 DIAGNOSIS — G610 Guillain-Barre syndrome: Secondary | ICD-10-CM | POA: Insufficient documentation

## 2012-08-18 DIAGNOSIS — Z79899 Other long term (current) drug therapy: Secondary | ICD-10-CM | POA: Insufficient documentation

## 2012-08-18 DIAGNOSIS — G459 Transient cerebral ischemic attack, unspecified: Secondary | ICD-10-CM | POA: Insufficient documentation

## 2012-08-18 DIAGNOSIS — G609 Hereditary and idiopathic neuropathy, unspecified: Secondary | ICD-10-CM | POA: Insufficient documentation

## 2012-12-07 ENCOUNTER — Encounter: Payer: Self-pay | Admitting: Neurology

## 2012-12-08 ENCOUNTER — Ambulatory Visit (INDEPENDENT_AMBULATORY_CARE_PROVIDER_SITE_OTHER): Payer: BC Managed Care – PPO | Admitting: Neurology

## 2012-12-08 ENCOUNTER — Encounter (INDEPENDENT_AMBULATORY_CARE_PROVIDER_SITE_OTHER): Payer: Self-pay

## 2012-12-08 ENCOUNTER — Encounter: Payer: Self-pay | Admitting: Neurology

## 2012-12-08 VITALS — BP 186/85 | HR 68 | Ht 69.0 in | Wt 141.0 lb

## 2012-12-08 DIAGNOSIS — I10 Essential (primary) hypertension: Secondary | ICD-10-CM

## 2012-12-08 DIAGNOSIS — I359 Nonrheumatic aortic valve disorder, unspecified: Secondary | ICD-10-CM

## 2012-12-08 DIAGNOSIS — D638 Anemia in other chronic diseases classified elsewhere: Secondary | ICD-10-CM

## 2012-12-08 DIAGNOSIS — E039 Hypothyroidism, unspecified: Secondary | ICD-10-CM

## 2012-12-08 DIAGNOSIS — E785 Hyperlipidemia, unspecified: Secondary | ICD-10-CM

## 2012-12-08 DIAGNOSIS — E211 Secondary hyperparathyroidism, not elsewhere classified: Secondary | ICD-10-CM

## 2012-12-08 DIAGNOSIS — G63 Polyneuropathy in diseases classified elsewhere: Secondary | ICD-10-CM

## 2012-12-08 MED ORDER — LAMOTRIGINE 100 MG PO TABS: ORAL_TABLET | ORAL | Status: DC

## 2012-12-08 MED ORDER — SERTRALINE HCL 25 MG PO TABS: 25.0000 mg | ORAL_TABLET | Freq: Every day | ORAL | Status: DC

## 2012-12-08 NOTE — Progress Notes (Signed)
[facility]  PATIENT: [name] DOB: 1926-08-09  HISTORICAL History of Present Illness:    [name] is a 79 year old right-handed white married male, accompanied by his wife, previous patient of Dr. Sandria Manly, last visit was in Jan 2014.  He had  a history of peripheral neuropathy due to Prograf, history of Guillain-Barre and end stage renal disease who undergoes dialysis in [place] three days weekly.  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  He was admitted to [facility] in April 2011 for shortness of breath, pulmonary edema, found blood cultures to be postive and treated with Vancomycin.  Questionable seizure event while in hospital.  Dilantin levels were normal.   Pt feels the burning in his feet is better since tapering and discontinuing Dilantin.He complains of pain in his knees, but has seen an orthopedist and surgery is not an option with his other medical conditions. He denies any falls. Does not use assistive device. Last seizure activity was April 2011. Denies side effects or tolerability with Lamictal.  Currently on 150mg  of Lamictal daily.    He also has mild memory trouble, is taking aricept 5 mg qday   REVIEW OF SYSTEMS: Full 14 system review of systems performed and notable only for hearing loss, runny nose, loss of vision, easy bruising, memory loss, weakness, sleepiness, change in appetite,  ALLERGIES: No Known Allergies  HOME MEDICATIONS: Outpatient Prescriptions Prior to Visit  Medication Sig Dispense Refill  . allopurinol (ZYLOPRIM) 100 MG tablet Take 100 mg by mouth daily.      Marland Kitchen aspirin 81 MG tablet Take 81 mg by mouth daily.        . B Complex-C-Folic Acid (DIALYVITE TABLET) TABS 30 mg daily.      . calcium carbonate (TUMS - DOSED IN MG ELEMENTAL CALCIUM) 500 MG chewable tablet Chew 1 tablet by mouth 2 (two) times daily as needed.        . carvedilol (COREG) 6.25 MG tablet Take 6.25 mg by mouth 2  (two) times daily with a meal.        . Cholecalciferol (VITAMIN D-3 PO) Take 1 tablet by mouth daily.        Marland Kitchen donepezil (ARICEPT) 5 MG tablet Take 5 mg by mouth daily.      . folic acid (FOLVITE) 1 MG tablet Take 1 mg by mouth daily.        . hydrocortisone (ANUSOL-HC) 25 MG suppository Use 1 suppository at bedtime for 7 days  7 suppository  1  . iron sucrose (VENOFER) 20 MG/ML injection Inject 100 mg into the vein. Take 100 mg during dialysis three times a week       . L-Methylfolate-B6-B12 2.8-25-2 MG TABS Take 1 tablet by mouth daily.        Marland Kitchen levothyroxine (SYNTHROID, LEVOTHROID) 75 MCG tablet Take 75 mcg by mouth daily.        . metroNIDAZOLE (METROGEL) 0.75 % gel Apply topically 2 (two) times daily. Apply to face topically BID       . mirtazapine (REMERON) 15 MG tablet Take 15 mg by mouth at bedtime.        . Multiple Vitamin (RENAL MULTIVITAMIN/ZINC PO) Take 1 tablet by mouth daily.        . Omega-3 Fat Ac-Cholecalciferol (MINICAPS VITAMIN-D/OMEGA-3) [phone] MG-UNIT CAPS Take by mouth daily.      Marland Kitchen omeprazole (PRILOSEC) 20 MG capsule Take 20 mg by  mouth daily.      . sertraline (ZOLOFT) 25 MG tablet Take 25 mg by mouth daily.      . simvastatin (ZOCOR) 20 MG tablet Take 20 mg by mouth at bedtime.        Marland Kitchen amLODipine (NORVASC) 10 MG tablet Take 10 mg by mouth daily.        . cefUROXime (CEFTIN) 500 MG tablet Take 500 mg by mouth 2 (two) times daily.        . colchicine 0.6 MG tablet Take 0.6 mg by mouth daily.        . famotidine-calcium carbonate-magnesium hydroxide (TUMS DUAL ACTION) 10-800-165 MG CHEW Chew 1 tablet by mouth daily as needed.      . lamoTRIgine (LAMICTAL) 100 MG tablet Take 100 mg by mouth. 1/2 tab every morning and 1 tab qhs        . levofloxacin (LEVAQUIN) 500 MG tablet Take 500 mg by mouth daily.      Marland Kitchen losartan (COZAAR) 50 MG tablet Take 50 mg by mouth daily.        . phenytoin (DILANTIN) 100 MG ER capsule Take 100 mg by mouth. Take 2 tablet by mouth two times a  day 5 days a week, 2 tablet in AM and 3 tablets in PM on Saturday and Sunday        No facility-administered medications prior to visit.    PAST MEDICAL HISTORY: Past Medical History  Diagnosis Date  . Colon polyp     adenomatous  . Renal failure   . Hyperlipidemia   . Gout   . Benign prostatic hypertrophy   . Aortic stenosis   . History of Clostridium difficile infection   . Hyperparathyroidism   . Anemia     of chronic disease  . Hypertension   . Hypothyroidism   . Hernia of unspecified site of abdominal cavity without mention of obstruction or gangrene     hiatal  . Diverticulosis   . Proctitis   . Internal hemorrhoids   . Seizures   . Colon cancer   . Dementia   . Depression   . Encounter for long-term (current) use of other medications   . Unspecified hereditary and idiopathic peripheral neuropathy   . Unspecified transient cerebral ischemia   . Acute infective polyneuritis   . Polyneuropathy in other diseases classified elsewhere   . End stage renal disease     PAST SURGICAL HISTORY: Past Surgical History  Procedure Laterality Date  . Kidney transplant      19 98  . Skin cancer excision      FAMILY HISTORY: Family History  Problem Relation Age of Onset  . Diabetes Brother   . Colon cancer Neg Hx   . Liver disease Son   . Stroke Father     SOCIAL HISTORY:  History   Social History  . Marital Status: Married    Spouse Name: [place]    Number of Children: 3  . Years of Education: 12   Occupational History  .      Retired   Social History Main Topics  . Smoking status: Former Smoker    Quit date: 10/29/1960  . Smokeless tobacco: Never Used  . Alcohol Use: No  . Drug Use: No  . Sexual Activity: Not on file   Other Topics Concern  . Not on file   Social History Narrative   Patient is married and to [place].    Caffeine three times a week.  Right handed.   Education- 12 th grade.     PHYSICAL EXAM   Filed Vitals:   12/08/12  1115  BP: 186/85  Pulse: 68  Height: 5\' 9"  (1.753 m)  Weight: 141 lb (63.957 kg)    Not recorded    Body mass index is 20.81 kg/(m^2).   Generalized: In no acute distress  Neck: Supple, no carotid bruits   Cardiac: Regular rate rhythm  Pulmonary: Clear to auscultation bilaterally  Musculoskeletal: No deformity  Neurological examination  Mentation: Alert oriented to time, place, history taking, and causual conversation, MMSE 22/30  Cranial nerve II-XII: Pupils were equal round reactive to light extraocular movements were full, visual field were full on confrontational test. facial sensation and strength were normal. hearing was intact to finger rubbing bilaterally. Uvula tongue midline.  head turning and shoulder shrug and were normal and symmetric.Tongue protrusion into cheek strength was normal.  Motor: normal tone, bulk and strength, left arm AV fistula.  Sensory: Length dependent decreased to  touch, pinprick to distal shin level, absent vibratory sensation at toes.  Coordination: Normal finger to nose, heel-to-shin bilaterally there was no truncal ataxia  Gait: Rising up from seated position by pushing on chair arm, mild wide based, atalgic, cautious gait,  Romberg signs: Negative  Deep tendon reflexes: Brachioradialis 1/1, biceps 1/1, triceps 1/1, patellar absent, Achilles  Absent  plantar responses were flexor bilaterally.   DIAGNOSTIC DATA (LABS, IMAGING, TESTING) - I reviewed patient records, labs, notes, testing and imaging myself where available.  Lab Results  Component Value Date   WBC 3.7* 10/30/2010   HGB 10.1* 10/30/2010   HCT 30.3* 10/30/2010   MCV 103.7* 10/30/2010   PLT 121.0* 10/30/2010      Component Value Date/Time   NA 140 05/08/2009 0505   K 4.6 05/08/2009 0505   CL 104 05/08/2009 0505   CO2 28 05/08/2009 0505   GLUCOSE 88 05/08/2009 0505   BUN 54* 05/08/2009 0505   CREATININE 9.34* 05/08/2009 0505   CALCIUM 8.4 05/08/2009 0505   PROT 5.1*  12/29/2008 0505   ALBUMIN 2.6* 05/08/2009 0505   AST 22 12/29/2008 0505   ALT 16 12/29/2008 0505   ALKPHOS 54 12/29/2008 0505   BILITOT 0.8 12/29/2008 0505   GFRNONAA 5* 05/08/2009 0505   GFRAA  Value: 7        The eGFR has been calculated using the MDRD equation. This calculation has not been validated in all clinical situations. eGFR's persistently <60 mL/min signify possible Chronic Kidney Disease.* 05/08/2009 0505   No results found for this basename: CHOL, HDL, LDLCALC, LDLDIRECT, TRIG, CHOLHDL   No results found for this basename: HGBA1C    ASSESSMENT AND PLAN  79 yo male with multiple complicated past medical history, now follow up for peripheral neuropathy, memory loss and seizure, refill his medications, including lamotrigine 100 mg half tablets in the morning, one tab at night. Aricept 5 mg every day return to clinic in one year,       Levert Feinstein, M.D. Ph.D.  [facility] [address], [address] [place], [place] [address] [phone]

## 2013-04-02 ENCOUNTER — Telehealth: Payer: Self-pay | Admitting: Internal Medicine

## 2013-04-02 ENCOUNTER — Inpatient Hospital Stay (HOSPITAL_COMMUNITY)
Admit: 2013-04-02 | Discharge: 2013-04-06 | DRG: 871 | Disposition: A | Discharge status: Skilled Nursing Facility | Payer: MEDICARE | Source: Other Acute Inpatient Hospital | Attending: Internal Medicine | Admitting: Internal Medicine

## 2013-04-02 ENCOUNTER — Encounter (HOSPITAL_COMMUNITY): Payer: Self-pay | Admitting: *Deleted

## 2013-04-02 ENCOUNTER — Inpatient Hospital Stay (HOSPITAL_COMMUNITY): Payer: MEDICARE

## 2013-04-02 DIAGNOSIS — E785 Hyperlipidemia, unspecified: Secondary | ICD-10-CM | POA: Diagnosis present

## 2013-04-02 DIAGNOSIS — Z85038 Personal history of other malignant neoplasm of large intestine: Secondary | ICD-10-CM

## 2013-04-02 DIAGNOSIS — R6521 Severe sepsis with septic shock: Secondary | ICD-10-CM

## 2013-04-02 DIAGNOSIS — Z8673 Personal history of transient ischemic attack (TIA), and cerebral infarction without residual deficits: Secondary | ICD-10-CM

## 2013-04-02 DIAGNOSIS — A419 Sepsis, unspecified organism: Secondary | ICD-10-CM

## 2013-04-02 DIAGNOSIS — Z823 Family history of stroke: Secondary | ICD-10-CM

## 2013-04-02 DIAGNOSIS — I120 Hypertensive chronic kidney disease with stage 5 chronic kidney disease or end stage renal disease: Secondary | ICD-10-CM | POA: Diagnosis present

## 2013-04-02 DIAGNOSIS — F0390 Unspecified dementia without behavioral disturbance: Secondary | ICD-10-CM | POA: Diagnosis present

## 2013-04-02 DIAGNOSIS — Z87891 Personal history of nicotine dependence: Secondary | ICD-10-CM

## 2013-04-02 DIAGNOSIS — N2581 Secondary hyperparathyroidism of renal origin: Secondary | ICD-10-CM | POA: Diagnosis present

## 2013-04-02 DIAGNOSIS — R569 Unspecified convulsions: Secondary | ICD-10-CM | POA: Diagnosis present

## 2013-04-02 DIAGNOSIS — N400 Enlarged prostate without lower urinary tract symptoms: Secondary | ICD-10-CM | POA: Diagnosis present

## 2013-04-02 DIAGNOSIS — G608 Other hereditary and idiopathic neuropathies: Secondary | ICD-10-CM | POA: Diagnosis present

## 2013-04-02 DIAGNOSIS — J9600 Acute respiratory failure, unspecified whether with hypoxia or hypercapnia: Secondary | ICD-10-CM

## 2013-04-02 DIAGNOSIS — I359 Nonrheumatic aortic valve disorder, unspecified: Secondary | ICD-10-CM | POA: Diagnosis present

## 2013-04-02 DIAGNOSIS — Z992 Dependence on renal dialysis: Secondary | ICD-10-CM

## 2013-04-02 DIAGNOSIS — R509 Fever, unspecified: Secondary | ICD-10-CM | POA: Diagnosis present

## 2013-04-02 DIAGNOSIS — Z7982 Long term (current) use of aspirin: Secondary | ICD-10-CM

## 2013-04-02 DIAGNOSIS — R6520 Severe sepsis without septic shock: Secondary | ICD-10-CM

## 2013-04-02 DIAGNOSIS — M109 Gout, unspecified: Secondary | ICD-10-CM | POA: Diagnosis present

## 2013-04-02 DIAGNOSIS — J189 Pneumonia, unspecified organism: Secondary | ICD-10-CM | POA: Diagnosis present

## 2013-04-02 DIAGNOSIS — Z940 Kidney transplant status: Secondary | ICD-10-CM

## 2013-04-02 DIAGNOSIS — N186 End stage renal disease: Secondary | ICD-10-CM | POA: Diagnosis present

## 2013-04-02 DIAGNOSIS — Z833 Family history of diabetes mellitus: Secondary | ICD-10-CM

## 2013-04-02 DIAGNOSIS — M1990 Unspecified osteoarthritis, unspecified site: Secondary | ICD-10-CM | POA: Diagnosis present

## 2013-04-02 DIAGNOSIS — E039 Hypothyroidism, unspecified: Secondary | ICD-10-CM | POA: Diagnosis present

## 2013-04-02 DIAGNOSIS — D638 Anemia in other chronic diseases classified elsewhere: Secondary | ICD-10-CM | POA: Diagnosis present

## 2013-04-02 DIAGNOSIS — I959 Hypotension, unspecified: Secondary | ICD-10-CM | POA: Diagnosis present

## 2013-04-02 DIAGNOSIS — D696 Thrombocytopenia, unspecified: Secondary | ICD-10-CM | POA: Diagnosis present

## 2013-04-02 LAB — MRSA PCR SCREENING: MRSA BY PCR: NEGATIVE

## 2013-04-02 LAB — GLUCOSE, CAPILLARY: Glucose-Capillary: 109 mg/dL — ABNORMAL HIGH (ref 70–99)

## 2013-04-02 MED ORDER — RENA-VITE PO TABS
1.0000 | ORAL_TABLET | Freq: Every day | ORAL | Status: DC
  Administered 2013-04-02 – 2013-04-05 (×4): 1 via ORAL
  Filled 2013-04-02 (×6): qty 1

## 2013-04-02 MED ORDER — ALLOPURINOL 100 MG PO TABS
100.0000 mg | ORAL_TABLET | Freq: Every day | ORAL | Status: DC
  Administered 2013-04-03 – 2013-04-06 (×4): 100 mg via ORAL
  Filled 2013-04-02 (×4): qty 1

## 2013-04-02 MED ORDER — ASPIRIN EC 81 MG PO TBEC
81.0000 mg | DELAYED_RELEASE_TABLET | Freq: Every day | ORAL | Status: DC
  Administered 2013-04-03 – 2013-04-06 (×4): 81 mg via ORAL
  Filled 2013-04-02 (×4): qty 1

## 2013-04-02 MED ORDER — VITAMIN D3 25 MCG (1000 UNIT) PO TABS
5000.0000 [IU] | ORAL_TABLET | Freq: Every day | ORAL | Status: DC
  Administered 2013-04-03 – 2013-04-06 (×4): 5000 [IU] via ORAL
  Filled 2013-04-02 (×4): qty 5

## 2013-04-02 MED ORDER — LEVOTHYROXINE SODIUM 75 MCG PO TABS
75.0000 ug | ORAL_TABLET | Freq: Every day | ORAL | Status: DC
  Administered 2013-04-03 – 2013-04-06 (×4): 75 ug via ORAL
  Filled 2013-04-02 (×6): qty 1

## 2013-04-02 MED ORDER — SODIUM CHLORIDE 0.9 % IV BOLUS (SEPSIS)
500.0000 mL | Freq: Once | INTRAVENOUS | Status: AC
  Administered 2013-04-02: 500 mL via INTRAVENOUS

## 2013-04-02 MED ORDER — CARVEDILOL 12.5 MG PO TABS
12.5000 mg | ORAL_TABLET | Freq: Every day | ORAL | Status: DC
  Administered 2013-04-02 – 2013-04-05 (×4): 12.5 mg via ORAL
  Filled 2013-04-02 (×6): qty 1

## 2013-04-02 MED ORDER — SERTRALINE HCL 25 MG PO TABS
25.0000 mg | ORAL_TABLET | Freq: Every day | ORAL | Status: DC
  Administered 2013-04-03 – 2013-04-06 (×4): 25 mg via ORAL
  Filled 2013-04-02 (×4): qty 1

## 2013-04-02 MED ORDER — SIMVASTATIN 20 MG PO TABS
20.0000 mg | ORAL_TABLET | Freq: Every day | ORAL | Status: DC
  Administered 2013-04-02 – 2013-04-05 (×4): 20 mg via ORAL
  Filled 2013-04-02 (×6): qty 1

## 2013-04-02 MED ORDER — MIRTAZAPINE 30 MG PO TABS
30.0000 mg | ORAL_TABLET | Freq: Every day | ORAL | Status: DC
  Administered 2013-04-02 – 2013-04-05 (×4): 30 mg via ORAL
  Filled 2013-04-02 (×6): qty 1

## 2013-04-02 MED ORDER — RENAL MULTIVITAMIN/ZINC PO TABS: 1.0000 | ORAL_TABLET | Freq: Every day | ORAL | Status: DC

## 2013-04-02 MED ORDER — VANCOMYCIN HCL 10 G IV SOLR
1500.0000 mg | Freq: Once | INTRAVENOUS | Status: AC
  Administered 2013-04-03: 1500 mg via INTRAVENOUS
  Filled 2013-04-02: qty 1500

## 2013-04-02 MED ORDER — PIPERACILLIN-TAZOBACTAM IN DEX 2-0.25 GM/50ML IV SOLN
2.2500 g | Freq: Three times a day (TID) | INTRAVENOUS | Status: DC
  Administered 2013-04-03 – 2013-04-06 (×10): 2.25 g via INTRAVENOUS
  Filled 2013-04-02 (×14): qty 50

## 2013-04-02 MED ORDER — PANTOPRAZOLE SODIUM 40 MG PO TBEC
40.0000 mg | DELAYED_RELEASE_TABLET | Freq: Every day | ORAL | Status: DC
  Administered 2013-04-03 – 2013-04-06 (×4): 40 mg via ORAL
  Filled 2013-04-02 (×4): qty 1

## 2013-04-02 MED ORDER — SODIUM CHLORIDE 0.9 % IV SOLN: 250.0000 mL | INTRAVENOUS | Status: DC | PRN

## 2013-04-02 MED ORDER — LAMOTRIGINE 25 MG PO TABS
50.0000 mg | ORAL_TABLET | Freq: Every day | ORAL | Status: DC
  Administered 2013-04-03 – 2013-04-06 (×4): 50 mg via ORAL
  Filled 2013-04-02 (×6): qty 2

## 2013-04-02 MED ORDER — FOLIC ACID 1 MG PO TABS
1.0000 mg | ORAL_TABLET | Freq: Every day | ORAL | Status: DC
  Administered 2013-04-03 – 2013-04-06 (×4): 1 mg via ORAL
  Filled 2013-04-02 (×4): qty 1

## 2013-04-02 MED ORDER — BIOTENE DRY MOUTH MT LIQD
15.0000 mL | Freq: Two times a day (BID) | OROMUCOSAL | Status: DC
  Administered 2013-04-02 – 2013-04-06 (×7): 15 mL via OROMUCOSAL
  Filled 2013-04-02: qty 15

## 2013-04-02 MED ORDER — HEPARIN SODIUM (PORCINE) 5000 UNIT/ML IJ SOLN
5000.0000 [IU] | Freq: Three times a day (TID) | INTRAMUSCULAR | Status: DC
  Administered 2013-04-03 – 2013-04-06 (×10): 5000 [IU] via SUBCUTANEOUS
  Filled 2013-04-02 (×15): qty 1

## 2013-04-02 MED ORDER — LAMOTRIGINE 100 MG PO TABS
100.0000 mg | ORAL_TABLET | Freq: Every day | ORAL | Status: DC
  Administered 2013-04-02 – 2013-04-05 (×4): 100 mg via ORAL
  Filled 2013-04-02 (×6): qty 1

## 2013-04-02 NOTE — H&P (Signed)
PULMONARY  / CRITICAL CARE MEDICINE HISTORY AND PHYSICAL EXAMINATION  Name: [name] MRN: [id number] DOB: [birth year]    ADMISSION DATE:  04/02/2013  CHIEF COMPLAINT:  Fevers  BRIEF PATIENT DESCRIPTION: 80 year-old male with end-stage renal disease presenting with hypotension in the setting of likely infection of unclear source following dialysis.  SIGNIFICANT EVENTS / STUDIES:  1. Hypotension at [facility]; transferred to [facility] on 04/02/2013 2. White blood cell count 8.4 at [facility] 3. Head CT at [facility] dated 04/02/2013 demonstrates no acute intracranial 4. Rapid influenza at [facility] negative for influenza A and B virus 5. On 5 of levo on arrival at [facility]; discontinued immediately after arrival.  LINES / TUBES: 1. Left IJ central venous catheter placed at [facility] 04/02/2013  CULTURES: 1. Blood cultures x2 04/02/2013  2. Sputum culture 04/02/2013   ANTIBIOTICS: 1. Ceftriaxone 04/02/2013 2. Azithromycin 04/02/2013   HISTORY OF PRESENT ILLNESS:  [name] is an 80 year old male with multiple medical issues some highlights of which include end-stage renal disease status post renal transplant at [name] which failed in 2004 now on hemodialysis, remote Guillain-Barr syndrome, aortic stenosis, and baseline dementia who presents to [facility] this evening as a transfer from [facility]. By report, during dialysis this evening he developed weakness and was unable to complete dialysis. He was transferred to [facility] where he had a fever  between 100 and 103. He was also hypotensive. A  central line was placed and he was started on fluid resuscitation, antibiotics, and pressors. He notes that he has not felt well for the last several weeks. He complains of a sore throat which has been present for weeks as well as a mild cough and shortness of breath. I also discussed [name] recent history with his wife,  [name] [phone]) who notes that he has been dizzy for several days but has not been sick for several weeks. He has also had a recent decreased exercise tolerance recently as well as a nonproductive cough.  PAST MEDICAL HISTORY :  Past Medical History  Diagnosis Date  . Colon polyp     adenomatous  . Renal failure   . Hyperlipidemia   . Gout   . Benign prostatic hypertrophy   . Aortic stenosis   . History of Clostridium difficile infection   . Hyperparathyroidism   . Anemia     of chronic disease  . Hypertension   . Hypothyroidism   . Hernia of unspecified site of abdominal cavity without mention of obstruction or gangrene     hiatal  . Diverticulosis   . Proctitis   . Internal hemorrhoids   . Seizures   . Colon cancer   . Dementia   . Depression   . Encounter for long-term (current) use of other medications   . Unspecified hereditary and idiopathic peripheral neuropathy   . Unspecified transient cerebral ischemia   . Acute infective polyneuritis   . Polyneuropathy in other diseases classified elsewhere   . End stage renal disease     Past Surgical History  Procedure Laterality Date  . Kidney transplant      1998  . Skin cancer excision      Prior to Admission medications   Medication Sig Start Date End Date Taking? Authorizing Provider  allopurinol (ZYLOPRIM) 100 MG tablet Take 100 mg by mouth daily. 06/22/12   Historical Provider, MD  aspirin 81 MG tablet Take 81 mg by mouth daily.  Historical Provider, MD  B Complex-C-Folic Acid (DIALYVITE TABLET) TABS 30 mg daily. 08/11/12   Historical Provider, MD  calcium carbonate (TUMS - DOSED IN MG ELEMENTAL CALCIUM) 500 MG chewable tablet Chew 1 tablet by mouth 2 (two) times daily as needed.      Historical Provider, MD  carvedilol (COREG) 6.25 MG tablet Take 6.25 mg by mouth 2 (two) times daily with a meal.      Historical Provider, MD  Cholecalciferol (VITAMIN D-3 PO) Take 1 tablet by mouth daily.      Historical  Provider, MD  donepezil (ARICEPT) 5 MG tablet Take 5 mg by mouth daily.    Historical Provider, MD  folic acid (FOLVITE) 1 MG tablet Take 1 mg by mouth daily.      Historical Provider, MD  hydrocortisone (ANUSOL-HC) 25 MG suppository Use 1 suppository at bedtime for 7 days 10/30/10   Willia Craze, NP  iron sucrose (VENOFER) 20 MG/ML injection Inject 100 mg into the vein. Take 100 mg during dialysis three times a week     Historical Provider, MD  L-Methylfolate-B6-B12 2.8-25-2 MG TABS Take 1 tablet by mouth daily.      Historical Provider, MD  lamoTRIgine (LAMICTAL) 100 MG tablet 1/2 tab po qam, one tab po qhs. 12/08/12   Marcial Pacas, MD  levothyroxine (SYNTHROID, LEVOTHROID) 75 MCG tablet Take 75 mcg by mouth daily.      Historical Provider, MD  metroNIDAZOLE (METROGEL) 0.75 % gel Apply topically 2 (two) times daily. Apply to face topically BID     Historical Provider, MD  mirtazapine (REMERON) 15 MG tablet Take 15 mg by mouth at bedtime.      Historical Provider, MD  Multiple Vitamin (RENAL MULTIVITAMIN/ZINC PO) Take 1 tablet by mouth daily.      Historical Provider, MD  Omega-3 Fat Ac-Cholecalciferol (MINICAPS VITAMIN-D/OMEGA-3) [phone] MG-UNIT CAPS Take by mouth daily.    Historical Provider, MD  omeprazole (PRILOSEC) 20 MG capsule Take 20 mg by mouth daily. 07/18/12   Historical Provider, MD  sertraline (ZOLOFT) 25 MG tablet Take 1 tablet (25 mg total) by mouth daily. 12/08/12   Marcial Pacas, MD  simvastatin (ZOCOR) 20 MG tablet Take 20 mg by mouth at bedtime.      Historical Provider, MD    No Known Allergies  FAMILY HISTORY:  Family History  Problem Relation Age of Onset  . Diabetes Brother   . Colon cancer Neg Hx   . Liver disease Son   . Stroke Father     SOCIAL HISTORY:  reports that he quit smoking about 52 years ago. He has never used smokeless tobacco. He reports that he does not drink alcohol or use illicit drugs.  REVIEW OF SYSTEMS:  A 12-system ROS was conducted and,  unless otherwise specified in the HPI, was negative.   PHYSICAL EXAM  VITAL SIGNS: Pulse Rate:  [63-66] 66 (03/06 2200) Resp:  [12-16] 14 (03/06 2200) BP: (96-127)/(42-45) 105/43 mmHg (03/06 2200) SpO2:  [97 %-100 %] 100 % (03/06 2200) Weight:  [142 lb 10.2 oz (64.7 kg)] 142 lb 10.2 oz (64.7 kg) (03/06 2132)  HEMODYNAMICS:    VENTILATOR SETTINGS:    INTAKE / OUTPUT: Intake/Output     03/06 0701 - 03/07 0700   Other 40   Total Intake(mL/kg) 40 (0.6)   Net +40         PHYSICAL EXAMINATION: General: Elderly male in no acute distress. Neuro:  Forgetful but able to answer questions. Moves all  extremities.  HEENT:  Sclera anicteric, conjunctiva pink. Mucous membranes moist, oropharynx clear.  Neck:  Trachea supple and midline, left internal jugular vein catheter site clean dry and intact. Cardiovascular:  Regular rate rhythm, normal S1-S2, no murmurs rubs or gallops . Lungs: Clear to auscultation bilaterally.  Abdomen:  Soft, nontender, nondistended, positive bowel sounds. Incisional hernia in the left hemiabdomen. Musculoskeletal:  No clubbing cyanosis or edema.  Skin:  Pulsatile fistula in the left arm. No localized rashes.   LABS:  CBC No results found for this basename: WBC, HGB, HCT, PLT,  in the last 72 hours  Coag's No results found for this basename: APTT, INR,  in the last 72 hours  BMET No results found for this basename: NA, K, CL, CO2, BUN, CREATININE, GLUCOSE,  in the last 72 hours  Electrolytes No results found for this basename: CALCIUM, MG, PHOS,  in the last 72 hours  Sepsis Markers No results found for this basename: LACTICACIDVEN, PROCALCITON, O2SATVEN,  in the last 72 hours  ABG No results found for this basename: PHART, PCO2ART, PO2ART,  in the last 72 hours  Liver Enzymes No results found for this basename: AST, ALT, ALKPHOS, BILITOT, ALBUMIN,  in the last 72 hours  Cardiac Enzymes No results found for this basename: TROPONINI, PROBNP,  in  the last 72 hours  Glucose Recent Labs     04/02/13  2133  GLUCAP  109*   Imaging No results found.  EKG: ECGs from her enough hospital were reviewed by me. There reports physical quality. The official read suggests sinus arrhythmia, however, P waves are only clearly seen in a few leads. There may be an occasional junctional rhythm.  CXR: Not available yet.   ASSESSMENT / PLAN: Principal Problem:   Hypotension, unspecified Active Problems:   Fever, unspecified   CAP (community acquired pneumonia)   AORTIC STENOSIS   RENAL FAILURE, END STAGE   Acute respiratory failure   Seizures   HYPOTHYROIDISM   ANEMIA OF CHRONIC DISEASE   PULMONARY A/P: 1. Acute respiratory failure: This is most likely secondary to community-acquired pneumonia by history.  Supplemental oxygen to maintain saturations greater than or equal to 92%  Repeat chest x-ray  CARDIOVASCULAR A/P:  1. Severe sepsis with hypotension: Again, this is presumably secondary to community-acquired pneumonia although the chest x-ray report from her hospital is equivocal. There is no evidence of active cardiac issue at this point. The last echocardiogram in our system was in 2000 and which demonstrates mild aortic stenosis.   Continue volume resuscitation  Check calcitonin  Serial lactates  Serial troponins  RENAL A/P:  1. End-stage renal disease:   Renal consult in the morning (needs to be called still)  GASTROINTESTINAL A/P:  1. No acute issue  HEMATOLOGIC A/P:   1. Anemia of chronic disease:   Serial hemoglobin  INFECTIOUS A/P: 1. Presumed community-acquired pneumonia:   Continue ceftriaxone and azithromycin; low threshold to broaden antibiotics  blood and sputum cultures  ENDOCRINE A/P: 1. Hypothyroidism:  Check TSH  Continue Home Regimen (minimum dose here 37.5; increased from wife's notation of 15 mcg)  NEUROLOGIC A/P: 1. Seizure DO:   Continue home regimen  BEST PRACTICE /  DISPOSITION Level of Care:  ICU Consultants:  Renal Code Status:  Full Diet:  Renal DVT Px:  SQH GI Px:  Not indicated Skin Integrity:  Intact Social / Family: Updated     TODAY'S SUMMARY:   I have personally obtained a history, examined the patient,  evaluated laboratory and imaging results, formulated the assessment and plan and placed orders.  CRITICAL CARE: The patient is critically ill with multiple organ systems failure and requires high complexity decision making for assessment and support, frequent evaluation and titration of therapies, application of advanced monitoring technologies and extensive interpretation of multiple databases. Critical Care Time devoted to patient care services described in this note is 45 minutes.   Margarette Asal, MD Pulmonary and [place] Pager: [phone]  04/02/2013, 10:58 PM

## 2013-04-02 NOTE — Telephone Encounter (Signed)
Dr. Maxwell Caul from [place] emergency Department cold about [name]. Patient developed hypotension, initial blood pressure was 170/70, blood pressure trended down. Patient received 2 L of IV fluids, he is a dialysis patient started on Levophed. PCCM will be notified by [facility] emergency department except the patient as patient is on vasopressors now.  Birdie Hopes Pager: [phone] 04/02/2013, 5:59 PM

## 2013-04-02 NOTE — Telephone Encounter (Signed)
PENDING ACCEPTANCE TRANFER NOTE:  Call received from:    Dr. Graylon Good in [place] REQUESTING TRANSFER:    Fever of 100.3, dialysis patient  HPI:   80 years old with past medical history of end-stage renal disease on dialysis, patient came in to [facility] for fever of 100.3, per ED physician fluids negative chest x-ray looks clear. Blood cultures obtained on dialysis but probably will repeat here and then started antibiotics.   PLAN:  According to telephone report, this patient was accepted for transfer to [facility],   Under [facility] team:  [id number],  I have requested an order be written to call Flow Manager at [phone] upon patient arrival to the floor for final physician assignment who will do the admission and give admitting orders.  SIGNED: Birdie Hopes, MD [facility]  04/02/2013, 4:17 PM

## 2013-04-02 NOTE — Progress Notes (Signed)
ANTIBIOTIC CONSULT NOTE - INITIAL  Pharmacy Consult for Vancomycin/Zosyn  Indication: rule out pneumonia  No Known Allergies  Patient Measurements: Height: 5\' 9"  (175.3 cm) Weight: 142 lb 10.2 oz (64.7 kg) IBW/kg (Calculated) : 70.7  Vital Signs: BP: 105/43 mmHg (03/06 2200) Pulse Rate: 66 (03/06 2200) Intake/Output from previous day:   Intake/Output from this shift: Total I/O In: 40 [Other:40] Out: -   Labs: No results found for this basename: WBC, HGB, PLT, LABCREA, CREATININE,  in the last 72 hours Estimated Creatinine Clearance: 5.4 ml/min (by C-G formula based on Cr of 9.34). No results found for this basename: VANCOTROUGH, VANCOPEAK, VANCORANDOM, GENTTROUGH, GENTPEAK, GENTRANDOM, TOBRATROUGH, TOBRAPEAK, TOBRARND, AMIKACINPEAK, AMIKACINTROU, AMIKACIN,  in the last 72 hours   Microbiology: No results found for this or any previous visit (from the past 720 hour(s)).  Medical History: Past Medical History  Diagnosis Date  . Colon polyp     adenomatous  . Renal failure   . Hyperlipidemia   . Gout   . Benign prostatic hypertrophy   . Aortic stenosis   . History of Clostridium difficile infection   . Hyperparathyroidism   . Anemia     of chronic disease  . Hypertension   . Hypothyroidism   . Hernia of unspecified site of abdominal cavity without mention of obstruction or gangrene     hiatal  . Diverticulosis   . Proctitis   . Internal hemorrhoids   . Seizures   . Colon cancer   . Dementia   . Depression   . Encounter for long-term (current) use of other medications   . Unspecified hereditary and idiopathic peripheral neuropathy   . Unspecified transient cerebral ischemia   . Acute infective polyneuritis   . Polyneuropathy in other diseases classified elsewhere   . End stage renal disease     Assessment: 80 y/o M with ESRD on HD per MD notes (unsure of current days) who presents with fever, r/o PNA.   Notables labs at [facility]  WBC 8.4 Scr  4.8  Medications at [facility]  Ceftriaxone 1g at 1800 Azithromycin 500 mg at 1800  Goal of Therapy:  Pre-HD vancomycin level 15-25 mg/L  Plan:  -Vancomycin 1500 mg IV x 1, additional post-HD doses to follow once HD schedule is known -Zosyn 2.25g IV q8h -Trend WBC, temp, cultures, imaging -F/U renal consult, HD schedule for additional vancomycin dosing  Narda Bonds 04/02/2013,11:29 PM

## 2013-04-03 DIAGNOSIS — N186 End stage renal disease: Secondary | ICD-10-CM

## 2013-04-03 DIAGNOSIS — I959 Hypotension, unspecified: Secondary | ICD-10-CM

## 2013-04-03 DIAGNOSIS — J9600 Acute respiratory failure, unspecified whether with hypoxia or hypercapnia: Secondary | ICD-10-CM

## 2013-04-03 DIAGNOSIS — A419 Sepsis, unspecified organism: Principal | ICD-10-CM

## 2013-04-03 LAB — CBC WITH DIFFERENTIAL/PLATELET
Basophils Absolute: 0 10*3/uL (ref 0.0–0.1)
Basophils Relative: 0 % (ref 0–1)
Eosinophils Absolute: 0 10*3/uL (ref 0.0–0.7)
Eosinophils Relative: 0 % (ref 0–5)
HEMATOCRIT: 29 % — AB (ref 39.0–52.0)
HEMOGLOBIN: 9.6 g/dL — AB (ref 13.0–17.0)
LYMPHS ABS: 1.9 10*3/uL (ref 0.7–4.0)
Lymphocytes Relative: 29 % (ref 12–46)
MCH: 33.9 pg (ref 26.0–34.0)
MCHC: 33.1 g/dL (ref 30.0–36.0)
MCV: 102.5 fL — ABNORMAL HIGH (ref 78.0–100.0)
MONO ABS: 0.3 10*3/uL (ref 0.1–1.0)
MONOS PCT: 4 % (ref 3–12)
NEUTROS ABS: 4.4 10*3/uL (ref 1.7–7.7)
Neutrophils Relative %: 67 % (ref 43–77)
Platelets: 94 10*3/uL — ABNORMAL LOW (ref 150–400)
RBC: 2.83 MIL/uL — ABNORMAL LOW (ref 4.22–5.81)
RDW: 14.8 % (ref 11.5–15.5)
WBC: 6.6 10*3/uL (ref 4.0–10.5)

## 2013-04-03 LAB — CORTISOL: CORTISOL PLASMA: 5.8 ug/dL

## 2013-04-03 LAB — COMPREHENSIVE METABOLIC PANEL
ALT: 17 U/L (ref 0–53)
AST: 50 U/L — ABNORMAL HIGH (ref 0–37)
Albumin: 3.1 g/dL — ABNORMAL LOW (ref 3.5–5.2)
Alkaline Phosphatase: 100 U/L (ref 39–117)
BUN: 30 mg/dL — AB (ref 6–23)
CALCIUM: 8.5 mg/dL (ref 8.4–10.5)
CO2: 28 mEq/L (ref 19–32)
Chloride: 100 mEq/L (ref 96–112)
Creatinine, Ser: 5.81 mg/dL — ABNORMAL HIGH (ref 0.50–1.35)
GFR calc non Af Amer: 8 mL/min — ABNORMAL LOW (ref >90)
GFR, EST AFRICAN AMERICAN: 9 mL/min — AB (ref >90)
GLUCOSE: 102 mg/dL — AB (ref 70–99)
Potassium: 4.5 mEq/L (ref 3.7–5.3)
Sodium: 144 mEq/L (ref 137–147)
Total Bilirubin: 0.3 mg/dL (ref 0.3–1.2)
Total Protein: 6.3 g/dL (ref 6.0–8.3)

## 2013-04-03 LAB — GLUCOSE, CAPILLARY
GLUCOSE-CAPILLARY: 108 mg/dL — AB (ref 70–99)
GLUCOSE-CAPILLARY: 113 mg/dL — AB (ref 70–99)
Glucose-Capillary: 64 mg/dL — ABNORMAL LOW (ref 70–99)
Glucose-Capillary: 167 mg/dL — ABNORMAL HIGH (ref 70–99)

## 2013-04-03 LAB — LACTIC ACID, PLASMA
Lactic Acid, Venous: 1.1 mmol/L (ref 0.5–2.2)
Lactic Acid, Venous: 1.3 mmol/L (ref 0.5–2.2)

## 2013-04-03 LAB — PROTIME-INR
INR: 1.19 (ref 0.00–1.49)
Prothrombin Time: 14.8 seconds (ref 11.6–15.2)

## 2013-04-03 LAB — PROCALCITONIN: Procalcitonin: 15.12 ng/mL

## 2013-04-03 LAB — MAGNESIUM: MAGNESIUM: 2 mg/dL (ref 1.5–2.5)

## 2013-04-03 LAB — PHOSPHORUS: Phosphorus: 4.5 mg/dL (ref 2.3–4.6)

## 2013-04-03 LAB — TSH: TSH: 0.567 u[IU]/mL (ref 0.350–4.500)

## 2013-04-03 LAB — TROPONIN I
Troponin I: <0.3 ng/mL (ref <0.30)
Troponin I: <0.3 ng/mL (ref <0.30)

## 2013-04-03 MED ORDER — SODIUM CHLORIDE 0.9 % IJ SOLN
10.0000 mL | INTRAMUSCULAR | Status: DC | PRN
  Administered 2013-04-03 – 2013-04-04 (×2): 20 mL
  Administered 2013-04-05 (×5): 10 mL

## 2013-04-03 NOTE — Consult Note (Signed)
[facility] Renal Consultation Note  Indication for Consultation:  Management of ESRD/hemodialysis; anemia, hypertension/volume and secondary hyperparathyroidism  HPI: [name] is a 80 y.o. male with a history of dementia, aortic stenosis, Guillain-Barre syndrome, and ESRD on dialysis on MWF at the [facility] whose dialysis was stopped after 2:20 of his 3:15 treatment yesterday secondary to restlessness, chills, and low blood pressure.  He was sent to [facility] where he had a fever of 100-103 and subsequently received 2 L of IV fluid and started Levophed for hypotension before he was transferred to [facility].  CT of the head showed no acute abnormality, and his WBC count was 8.4.  He was negative for influenza, and blood cultures are pending. Levophed was stopped after arrival at [facility]. He reports sore throat, dyspnea on exertion, nonproductive cough, and intermittent dizziness for the last several days, but denies any nausea, vomiting, or diarrhea. Seen by me on 3/8 feels much better after antibiotics, more hemodynamically stable  Dialysis Orders:   MWF @ [facility] 3:15     64 kg     2K/2.25Ca    450/A1.5      Profile 2       Heparin 0       AVF @ LFA    Hectorol 1 mcg           Epogen 1200 U          Venofer 0  Past Medical History  Diagnosis Date  . Colon polyp     adenomatous  . Renal failure   . Hyperlipidemia   . Gout   . Benign prostatic hypertrophy   . Aortic stenosis   . History of Clostridium difficile infection   . Hyperparathyroidism   . Anemia     of chronic disease  . Hypertension   . Hypothyroidism   . Hernia of unspecified site of abdominal cavity without mention of obstruction or gangrene     hiatal  . Diverticulosis   . Proctitis   . Internal hemorrhoids   . Seizures   . Colon cancer   . Dementia   . Depression   . Encounter for long-term (current) use of other medications   . Unspecified hereditary and idiopathic  peripheral neuropathy   . Unspecified transient cerebral ischemia   . Acute infective polyneuritis   . Polyneuropathy in other diseases classified elsewhere   . End stage renal disease    Past Surgical History  Procedure Laterality Date  . Kidney transplant      1998  . Skin cancer excision     Family History  Problem Relation Age of Onset  . Diabetes Brother   . Colon cancer Neg Hx   . Liver disease Son   . Stroke Father    Social History He quit smoking about 52 years ago after using 1-2 packs a day, but denies any history of alcohol or illicit drug use.  He previously worked as a Psychologist, occupational.  No Known Allergies Prior to Admission medications   Medication Sig Start Date End Date Taking? Authorizing Provider  levothyroxine (SYNTHROID, LEVOTHROID) 75 MCG tablet Take 75 mcg by mouth daily before breakfast.   Yes Historical Provider, MD  allopurinol (ZYLOPRIM) 100 MG tablet Take 100 mg by mouth daily. 06/22/12   Historical Provider, MD  aspirin 81 MG tablet Take 81 mg by mouth daily.      Historical Provider, MD  B Complex-C-Folic Acid (DIALYVITE TABLET) TABS 30  mg daily. 08/11/12   Historical Provider, MD  calcium carbonate (TUMS - DOSED IN MG ELEMENTAL CALCIUM) 500 MG chewable tablet Chew 1 tablet by mouth 2 (two) times daily as needed.      Historical Provider, MD  carvedilol (COREG) 6.25 MG tablet Take 12.5 mg by mouth at bedtime.     Historical Provider, MD  Cholecalciferol (VITAMIN D-3 PO) Take 1 tablet by mouth daily.      Historical Provider, MD  folic acid (FOLVITE) 1 MG tablet Take 1 mg by mouth daily.      Historical Provider, MD  lamoTRIgine (LAMICTAL) 100 MG tablet 1/2 tab po qam, one tab po qhs. 12/08/12   Marcial Pacas, MD  mirtazapine (REMERON) 15 MG tablet Take 30 mg by mouth at bedtime.     Historical Provider, MD  Multiple Vitamin (RENAL MULTIVITAMIN/ZINC PO) Take 1 tablet by mouth daily.      Historical Provider, MD  Omega-3 Fat Ac-Cholecalciferol (MINICAPS  VITAMIN-D/OMEGA-3) [phone] MG-UNIT CAPS Take by mouth daily.    Historical Provider, MD  omeprazole (PRILOSEC) 20 MG capsule Take 20 mg by mouth daily. 07/18/12   Historical Provider, MD  sertraline (ZOLOFT) 25 MG tablet Take 1 tablet (25 mg total) by mouth daily. 12/08/12   Marcial Pacas, MD  simvastatin (ZOCOR) 20 MG tablet Take 20 mg by mouth at bedtime.      Historical Provider, MD   Labs:  Results for orders placed during the hospital encounter of 04/02/13 (from the past 48 hour(s))  MRSA PCR SCREENING     Status: None   Collection Time    04/02/13  9:33 PM      Result Value Ref Range   MRSA by PCR NEGATIVE  NEGATIVE   Comment:            The GeneXpert MRSA Assay (FDA     approved for NASAL specimens     only), is one component of a     comprehensive MRSA colonization     surveillance program. It is not     intended to diagnose MRSA     infection nor to guide or     monitor treatment for     MRSA infections.  GLUCOSE, CAPILLARY     Status: Abnormal   Collection Time    04/02/13  9:33 PM      Result Value Ref Range   Glucose-Capillary 109 (*) 70 - 99 mg/dL  TROPONIN I     Status: None   Collection Time    04/02/13 11:06 PM      Result Value Ref Range   Troponin I <0.30  <0.30 ng/mL   Comment:            Due to the release kinetics of cTnI,     a negative result within the first hours     of the onset of symptoms does not rule out     myocardial infarction with certainty.     If myocardial infarction is still suspected,     repeat the test at appropriate intervals.  COMPREHENSIVE METABOLIC PANEL     Status: Abnormal   Collection Time    04/02/13 11:30 PM      Result Value Ref Range   Sodium 144  137 - 147 mEq/L   Potassium 4.5  3.7 - 5.3 mEq/L   Chloride 100  96 - 112 mEq/L   CO2 28  19 - 32 mEq/L   Glucose, Bld 102 (*) 70 -  99 mg/dL   BUN 30 (*) 6 - 23 mg/dL   Creatinine, Ser 5.81 (*) 0.50 - 1.35 mg/dL   Calcium 8.5  8.4 - 10.5 mg/dL   Total Protein 6.3  6.0 - 8.3  g/dL   Albumin 3.1 (*) 3.5 - 5.2 g/dL   AST 50 (*) 0 - 37 U/L   ALT 17  0 - 53 U/L   Alkaline Phosphatase 100  39 - 117 U/L   Total Bilirubin 0.3  0.3 - 1.2 mg/dL   GFR calc non Af Amer 8 (*) >90 mL/min   GFR calc Af Amer 9 (*) >90 mL/min   Comment: (NOTE)     The eGFR has been calculated using the CKD EPI equation.     This calculation has not been validated in all clinical situations.     eGFR's persistently <90 mL/min signify possible Chronic Kidney     Disease.  MAGNESIUM     Status: None   Collection Time    04/02/13 11:30 PM      Result Value Ref Range   Magnesium 2.0  1.5 - 2.5 mg/dL  PHOSPHORUS     Status: None   Collection Time    04/02/13 11:30 PM      Result Value Ref Range   Phosphorus 4.5  2.3 - 4.6 mg/dL  LACTIC ACID, PLASMA     Status: None   Collection Time    04/02/13 11:30 PM      Result Value Ref Range   Lactic Acid, Venous 1.3  0.5 - 2.2 mmol/L  PROCALCITONIN     Status: None   Collection Time    04/02/13 11:30 PM      Result Value Ref Range   Procalcitonin 15.12     Comment:            Interpretation:     PCT >= 10 ng/mL:     Important systemic inflammatory response,     almost exclusively due to severe bacterial     sepsis or septic shock.     (NOTE)             ICU PCT Algorithm               Non ICU PCT Algorithm        ----------------------------     ------------------------------             PCT < 0.25 ng/mL                 PCT < 0.1 ng/mL         Stopping of antibiotics            Stopping of antibiotics           strongly encouraged.               strongly encouraged.        ----------------------------     ------------------------------           PCT level decrease by               PCT < 0.25 ng/mL           >= 80% from peak PCT           OR PCT 0.25 - 0.5 ng/mL          Stopping of antibiotics  encouraged.         Stopping of antibiotics               encouraged.         ----------------------------     ------------------------------           PCT level decrease by              PCT >= 0.25 ng/mL           < 80% from peak PCT            AND PCT >= 0.5 ng/mL            Continuing antibiotics                                                  encouraged.           Continuing antibiotics                encouraged.        ----------------------------     ------------------------------         PCT level increase compared          PCT > 0.5 ng/mL             with peak PCT AND              PCT >= 0.5 ng/mL             Escalation of antibiotics                                              strongly encouraged.          Escalation of antibiotics            strongly encouraged.  CBC WITH DIFFERENTIAL     Status: Abnormal   Collection Time    04/02/13 11:30 PM      Result Value Ref Range   WBC 6.6  4.0 - 10.5 K/uL   RBC 2.83 (*) 4.22 - 5.81 MIL/uL   Hemoglobin 9.6 (*) 13.0 - 17.0 g/dL   HCT 29.0 (*) 39.0 - 52.0 %   MCV 102.5 (*) 78.0 - 100.0 fL   MCH 33.9  26.0 - 34.0 pg   MCHC 33.1  30.0 - 36.0 g/dL   RDW 14.8  11.5 - 15.5 %   Platelets 94 (*) 150 - 400 K/uL   Comment: PLATELET COUNT CONFIRMED BY SMEAR     SPECIMEN CHECKED FOR CLOTS     REPEATED TO VERIFY   Neutrophils Relative % 67  43 - 77 %   Neutro Abs 4.4  1.7 - 7.7 K/uL   Lymphocytes Relative 29  12 - 46 %   Lymphs Abs 1.9  0.7 - 4.0 K/uL   Monocytes Relative 4  3 - 12 %   Monocytes Absolute 0.3  0.1 - 1.0 K/uL   Eosinophils Relative 0  0 - 5 %   Eosinophils Absolute 0.0  0.0 - 0.7 K/uL   Basophils Relative 0  0 - 1 %   Basophils Absolute 0.0  0.0 - 0.1 K/uL  PROTIME-INR     Status: None   Collection Time  04/02/13 11:30 PM      Result Value Ref Range   Prothrombin Time 14.8  11.6 - 15.2 seconds   INR 1.19  0.00 - 1.49  TROPONIN I     Status: None   Collection Time    04/03/13  4:45 AM      Result Value Ref Range   Troponin I <0.30  <0.30 ng/mL   Comment:            Due to the release  kinetics of cTnI,     a negative result within the first hours     of the onset of symptoms does not rule out     myocardial infarction with certainty.     If myocardial infarction is still suspected,     repeat the test at appropriate intervals.  LACTIC ACID, PLASMA     Status: None   Collection Time    04/03/13  4:45 AM      Result Value Ref Range   Lactic Acid, Venous 1.1  0.5 - 2.2 mmol/L  GLUCOSE, CAPILLARY     Status: Abnormal   Collection Time    04/03/13  7:42 AM      Result Value Ref Range   Glucose-Capillary 64 (*) 70 - 99 mg/dL  GLUCOSE, CAPILLARY     Status: Abnormal   Collection Time    04/03/13  9:06 AM      Result Value Ref Range   Glucose-Capillary 108 (*) 70 - 99 mg/dL  TROPONIN I     Status: None   Collection Time    04/03/13 10:53 AM      Result Value Ref Range   Troponin I <0.30  <0.30 ng/mL   Comment:            Due to the release kinetics of cTnI,     a negative result within the first hours     of the onset of symptoms does not rule out     myocardial infarction with certainty.     If myocardial infarction is still suspected,     repeat the test at appropriate intervals.  GLUCOSE, CAPILLARY     Status: Abnormal   Collection Time    04/03/13 11:33 AM      Result Value Ref Range   Glucose-Capillary 113 (*) 70 - 99 mg/dL   Constitutional:  negative for fatigue, chills, fever, and sweats Ears, nose, mouth, throat, and face: positive for sore throat; negative for earaches, hoarseness and nasal congestion Respiratory: positive for dyspnea on exertion and nonproductive cough; negative for orthopnea, PND, or hemoptysis Cardiovascular: negative for chest pain or discomfort, palpitations Gastrointestinal:  Negative for abdominal pain, nausea, vomiting, or change in bowel habits Genitourinary:  Negative, anuric Musculoskeletal:  Negative for arthralgias, myalgias, back pain, or neck pain Neurological:  Positive for dizziness, paresthesia in lower extremities;  negative for gait changes, headaches  Physical Exam: Filed Vitals:   04/03/13 1339  BP: 112/60  Pulse: 78  Temp: 98 F (36.7 C)  Resp: 20     General appearance: alert, cooperative and no distress Head: Normocephalic, without obvious abnormality, atraumatic Neck: no adenopathy, no carotid bruit, no JVD and supple, symmetrical, trachea midline Resp: clear to auscultation bilaterally Cardio: RRR with Gr II/VI systolic murmur, no rub GI: soft, non-tender; bowel sounds normal; no masses,  no organomegaly Extremities: extremities normal, atraumatic, no cyanosis or edema Neurologic: Grossly normal Dialysis Access: AVF @ LFA with + bruit   Assessment/Plan: 1.  Fever - with hypotension, possibly CAP, although CXR clear; influenza negative, BCs pending, on Zosyn. 2. ESRD - HD on MWF @ [facility], K 4.5, ran 2:20 of 3:15 yesterday, stable.  Next HD 3/9. I cannot help bu wonder if 3 hours and 15 min is enough dialysis for him- may have contributed to his hypotension with HD- I would favor increasing time to at least 3 hours and 45 min 3. Hypotension/volume - BP 112/60 now, initially requiring IVF & Levophed; wt 67.3 kg, but CXR clear.   4. Anemia - Hgb 9.6 on outpatient Epogen.  Aranesp with HD. 5. Metabolic bone disease - Ca 8.5 (9.2 corrected), P 4.5, last iPTH 247; Hectorol 1 mcg, no binders. 6. Nutrition - Alb 3.1, renal diet & vitamin. 7. Hypothyroidism - on Levothyroxine.  8. Hx Guillain-Barre syndrome - diagnosed in 2003.  LYLES,Havard 04/03/2013, 3:04 PM   Attending Nephrologist: Corliss Parish, MD  Patient seen and examined, agree with above note with above modifications. 80 year old WM well known to [place].  He was transferred to [facility] then subsequently to [facility] for hypotension which had its onset during HD.  He seemed to have some sort of infection bronchitis/pna but also could have had hypotension due to low treatment time in an 80 year old.  Will plan for HD here tomorrow-  possibly convert to PO antibiotics and home soon.  Corliss Parish, MD 04/04/2013

## 2013-04-03 NOTE — Plan of Care (Signed)
Problem: Phase I Progression Outcomes Goal: Voiding-avoid urinary catheter unless indicated Outcome: Not Applicable Date Met:  57/50/51 Pt anuric, on HD

## 2013-04-03 NOTE — Progress Notes (Signed)
PULMONARY  / CRITICAL CARE MEDICINE HISTORY AND PHYSICAL EXAMINATION  Name: [name] MRN: [id number] DOB: 1926-12-14    ADMISSION DATE:  04/02/2013  CHIEF COMPLAINT:  Fevers  BRIEF PATIENT DESCRIPTION: 80 year-old male with end-stage renal disease (s/p failed transplant) presenting with hypotension in the setting of likely infection of unclear source following dialysis.  SIGNIFICANT EVENTS / STUDIES:  3/6>>>[facility]ot[facility]; transferred to [facility] 3/6 CT head ([place]>>> neg    LINES / TUBES: L IJ CVL ([place]) 3/6>>>   CULTURES: 3/6 flu ([place])>>>neg  3/6 Blood cultures x2 >>> 3/6 Sputum culture >>>  ANTIBIOTICS: Zosyn 3/6>>> Vanc 3/6>>>   Subjective/Overnight:  Off pressors.  No c/o.  Wants to eat.   VITAL SIGNS: Temp:  [97.4 F (36.3 C)-97.7 F (36.5 C)] 97.7 F (36.5 C) (03/07 0748) Pulse Rate:  [51-83] 75 (03/07 1000) Resp:  [11-23] 17 (03/07 1000) BP: (88-153)/(35-72) 113/42 mmHg (03/07 1000) SpO2:  [94 %-100 %] 94 % (03/07 1000) Weight:  [142 lb 10.2 oz (64.7 kg)-148 lb 5.9 oz (67.3 kg)] 148 lb 5.9 oz (67.3 kg) (03/07 0500)  HEMODYNAMICS:    VENTILATOR SETTINGS:    INTAKE / OUTPUT: Intake/Output     03/06 0701 - 03/07 0700 03/07 0701 - 03/08 0700   P.O.  120   I.V. (mL/kg) 130 (1.9) 60 (0.9)   Other 60    IV Piggyback 1100    Total Intake(mL/kg) 1290 (19.2) 180 (2.7)   Net +1290 +180          PHYSICAL EXAMINATION: General: Elderly male in no acute distress. Neuro:  Awake, alert, appropriate, MAE  HEENT:  Sclera anicteric, conjunctiva pink. Mucous membranes moist, oropharynx clear, L IJ c/d  Cardiovascular:  Regular rate rhythm, normal W2-B7, 3/6 systolic murmur  Lungs: resps even non labored on [place], Clear to auscultation bilaterally.  Abdomen:  Soft, nontender, nondistended, positive bowel sounds. Incisional hernia in the left hemiabdomen. Musculoskeletal:  No clubbing cyanosis or edema.  Skin:  Pulsatile  fistula in the left arm. No localized rashes.   LABS:  CBC Recent Labs     04/02/13  2330  WBC  6.6  HGB  9.6*  HCT  29.0*  PLT  94*    Coag's Recent Labs     04/02/13  2330  INR  1.19    BMET Recent Labs     04/02/13  2330  NA  144  K  4.5  CL  100  CO2  28  BUN  30*  CREATININE  5.81*  GLUCOSE  102*    Electrolytes Recent Labs     04/02/13  2330  CALCIUM  8.5  MG  2.0  PHOS  4.5    Sepsis Markers Recent Labs     04/02/13  2330  PROCALCITON  15.12    ABG No results found for this basename: PHART, PCO2ART, PO2ART,  in the last 72 hours  Liver Enzymes Recent Labs     04/02/13  2330  AST  50*  ALT  17  ALKPHOS  100  BILITOT  0.3  ALBUMIN  3.1*    Cardiac Enzymes Recent Labs     04/02/13  2306  04/03/13  0445  TROPONINI  <0.30  <0.30    Glucose Recent Labs     04/02/13  2133  04/03/13  0742  04/03/13  0906  GLUCAP  109*  64*  108*   Imaging Dg Chest Port 1 View  04/03/2013  CLINICAL DATA:  Pneumonia.  EXAM: PORTABLE CHEST - 1 VIEW  COMPARISON:  04/02/2013  FINDINGS: There is a left IJ catheter in stable position.  Stable cardiomegaly. Better aerated lungs. No definitive pneumonia. No edema or effusion. Skin edges bilaterally. No visible pneumothorax.  IMPRESSION: Improved lung aeration.  No focal infiltrate.   Electronically Signed   By: Jorje Guild M.D.   On: 04/03/2013 05:14     ASSESSMENT / PLAN: Principal Problem:   Hypotension, unspecified Active Problems:   HYPOTHYROIDISM   ANEMIA OF CHRONIC DISEASE   AORTIC STENOSIS   RENAL FAILURE, END STAGE   Fever, unspecified   Acute respiratory failure   CAP (community acquired pneumonia)   Seizures   Sepsis   PULMONARY Acute respiratory failure  Presumed CAP - per cxr at [place], no obvious infiltrate now  P:  Supplemental O2 as needed  F/u CXR  abx as above for presumed CAP  Pulmonary hygiene  Avoid volume overload    CARDIOVASCULAR Severe sepsis  Hx HTN   Normal troponin P:  Continue gentle volume  Trend lactate - much improved  Resume home coreg  EKG in am - intermittent irregular HR  RENAL ESRD  P:  Renal following  F/u chem   GASTROINTESTINAL No active issue  P: PO diet as tol   HEMATOLOGIC Anemia of chronic disease  P:  F/u cbc  SQ heparin    INFECTIOUS Presumed CAP -initially rx with rocephin, azithro  P:  Continue vanc, zosyn as above for ?PNA v other source sepsis F/u culture data   ENDOCRINE Hypothyroid  P:  Check TSH  Cont synthroid    NEUROLOGIC Hx seizures  P:  Cont home rx  (Lamictal)   Pt, wife updated at bedside.  Tx to telemetry - will ask [facility] to assume care in am.    Darlina Sicilian, NP 04/03/2013  11:05 AM Pager: [phone] or [phone]  *Care during the described time interval was provided by me and/or other providers on the critical care team. I have reviewed this patient's available data, including medical history, events of note, physical examination and test results as part of my evaluation.   PCCM ATTENDING: I have interviewed and examined the patient and reviewed the database. I have formulated the assessment and plan as reflected in the note above with amendments made by me.   Merton Border, MD;  PCCM service; Mobile [phone]

## 2013-04-04 DIAGNOSIS — J189 Pneumonia, unspecified organism: Secondary | ICD-10-CM

## 2013-04-04 DIAGNOSIS — D638 Anemia in other chronic diseases classified elsewhere: Secondary | ICD-10-CM

## 2013-04-04 LAB — BASIC METABOLIC PANEL
BUN: 45 mg/dL — ABNORMAL HIGH (ref 6–23)
CALCIUM: 7.9 mg/dL — AB (ref 8.4–10.5)
CHLORIDE: 97 meq/L (ref 96–112)
CO2: 26 meq/L (ref 19–32)
Creatinine, Ser: 7.73 mg/dL — ABNORMAL HIGH (ref 0.50–1.35)
GFR calc Af Amer: 7 mL/min — ABNORMAL LOW (ref >90)
GFR calc non Af Amer: 6 mL/min — ABNORMAL LOW (ref >90)
Glucose, Bld: 87 mg/dL (ref 70–99)
Potassium: 4 mEq/L (ref 3.7–5.3)
SODIUM: 138 meq/L (ref 137–147)

## 2013-04-04 LAB — CBC
HCT: 23.9 % — ABNORMAL LOW (ref 39.0–52.0)
Hemoglobin: 8.1 g/dL — ABNORMAL LOW (ref 13.0–17.0)
MCH: 34.6 pg — ABNORMAL HIGH (ref 26.0–34.0)
MCHC: 33.9 g/dL (ref 30.0–36.0)
MCV: 102.1 fL — ABNORMAL HIGH (ref 78.0–100.0)
Platelets: 81 10*3/uL — ABNORMAL LOW (ref 150–400)
RBC: 2.34 MIL/uL — AB (ref 4.22–5.81)
RDW: 14.6 % (ref 11.5–15.5)
WBC: 5.6 10*3/uL (ref 4.0–10.5)

## 2013-04-04 MED ORDER — VANCOMYCIN HCL IN DEXTROSE 750-5 MG/150ML-% IV SOLN
750.0000 mg | INTRAVENOUS | Status: DC
  Administered 2013-04-05: 750 mg via INTRAVENOUS
  Filled 2013-04-04: qty 150

## 2013-04-04 MED ORDER — DOCUSATE SODIUM 100 MG PO CAPS
100.0000 mg | ORAL_CAPSULE | Freq: Two times a day (BID) | ORAL | Status: DC
  Administered 2013-04-04 – 2013-04-06 (×4): 100 mg via ORAL
  Filled 2013-04-04 (×5): qty 1

## 2013-04-04 MED ORDER — SACCHAROMYCES BOULARDII 250 MG PO CAPS
250.0000 mg | ORAL_CAPSULE | Freq: Two times a day (BID) | ORAL | Status: DC
  Administered 2013-04-04 – 2013-04-06 (×3): 250 mg via ORAL
  Filled 2013-04-04 (×5): qty 1

## 2013-04-04 NOTE — Progress Notes (Signed)
ANTIBIOTIC CONSULT NOTE - Follow Up  Pharmacy Consult for Vancomycin/Zosyn  Indication: rule out pneumonia  No Known Allergies  Patient Measurements: Height: 5\' 9"  (175.3 cm) Weight: 151 lb 14.4 oz (68.9 kg) IBW/kg (Calculated) : 70.7  Vital Signs: Temp: 97.9 F (36.6 C) (03/08 0900) Temp src: Oral (03/08 0900) BP: 116/53 mmHg (03/08 0900) Pulse Rate: 65 (03/08 0900) Intake/Output from previous day: 03/07 0701 - 03/08 0700 In: 1020 [P.O.:600; I.V.:320; IV Piggyback:100] Out: -  Intake/Output from this shift: Total I/O In: 240 [P.O.:240] Out: -   Labs:  Recent Labs  04/02/13 2330 04/04/13 0436  WBC 6.6 5.6  HGB 9.6* 8.1*  PLT 94* 81*  CREATININE 5.81* 7.73*   Estimated Creatinine Clearance: 6.9 ml/min (by C-G formula based on Cr of 7.73). No results found for this basename: VANCOTROUGH, Corlis Leak, VANCORANDOM, Callaghan, GENTPEAK, GENTRANDOM, TOBRATROUGH, TOBRAPEAK, TOBRARND, AMIKACINPEAK, AMIKACINTROU, AMIKACIN,  in the last 72 hours   Microbiology: Recent Results (from the past 720 hour(s))  MRSA PCR SCREENING     Status: None   Collection Time    04/02/13  9:33 PM      Result Value Ref Range Status   MRSA by PCR NEGATIVE  NEGATIVE Final   Comment:            The GeneXpert MRSA Assay (FDA     approved for NASAL specimens     only), is one component of a     comprehensive MRSA colonization     surveillance program. It is not     intended to diagnose MRSA     infection nor to guide or     monitor treatment for     MRSA infections.    Medical History: Past Medical History  Diagnosis Date  . Colon polyp     adenomatous  . Renal failure   . Hyperlipidemia   . Gout   . Benign prostatic hypertrophy   . Aortic stenosis   . History of Clostridium difficile infection   . Hyperparathyroidism   . Anemia     of chronic disease  . Hypertension   . Hypothyroidism   . Hernia of unspecified site of abdominal cavity without mention of obstruction or  gangrene     hiatal  . Diverticulosis   . Proctitis   . Internal hemorrhoids   . Seizures   . Colon cancer   . Dementia   . Depression   . Encounter for long-term (current) use of other medications   . Unspecified hereditary and idiopathic peripheral neuropathy   . Unspecified transient cerebral ischemia   . Acute infective polyneuritis   . Polyneuropathy in other diseases classified elsewhere   . End stage renal disease     Assessment: 80 y/o M with ESRD/HD (MWF) who presents with fever. Pharmacy is consulted to dose vancomycin and Zosyn for r/o PNA. Patient is s/p LD of vancomycin IV 1500mg  on 3/7.  He is currently afebrile and WBC is wnl.  Goal of Therapy:  Pre-HD vancomycin level 15-25 mg/L  Plan:  - give vancomycin IV 750mg  post-HD on MWF - monitor for any additional HD sessions that would require additional vancomycin doses - continue Zosyn 2.25g IV q8h (traditional infusion time) - monitor HD tolerance, WBC, temperature curve, any cultures, and clinical progress  Ovid Curd E. Jacqlyn Larsen, PharmD Clinical Pharmacist - Resident Pager: [phone] Pharmacy: [phone] 04/04/2013 11:08 AM

## 2013-04-04 NOTE — Progress Notes (Signed)
[facility] PROGRESS NOTE  AIMAN NOE IZT:[id number] DOB: April 17, 1926 DOA: 04/02/2013 PCP: Angelina Sheriff., MD  Assessment/Plan: 1-Acute respiratory failure  Presumed CAP - per cxr at [place], no obvious infiltrate now  Supplemental O2 as needed  Continue with vancomycin with dialysis and Zosyn day 3.   2-Severe sepsis  Presume to PNA.  Was on Levophed at [place].  BP stable.  Resume home coreg  EKG in am - intermittent irregular HR Blood culture no growth to date.   3-ESRD; on hemodialysis. Renal following.   4-Anemia of chronic disease  F/u cbc   5-Thrombocytopenia; chronic. Platelet at 98 in 2011.  Follow trend.   6-Hypothyroidism; continue with synthroid.  7-History of seizure; Continue with Lamictal.  8-History Glennon Hamilton.   Code Status: Full Code.  Family Communication: none at bedside.  Disposition Plan: remain in patient. PT consulted.    Consultants:  Renal.   Procedures// events.  L IJ CVL ([place]) 3/6>>> 3/6>>>Hypotension at [facility]; transferred to [facility]  3/6 CT head ([place]>>> neg    Antibiotics:  Zosyn 3-6  Vancomycin 3-6  HPI/Subjective: Feeling well, no complaints.   Objective: Filed Vitals:   04/04/13 0900  BP: 116/53  Pulse: 65  Temp: 97.9 F (36.6 C)  Resp: 18    Intake/Output Summary (Last 24 hours) at 04/04/13 1325 Last data filed at 04/04/13 0900  Gross per 24 hour  Intake    920 ml  Output      0 ml  Net    920 ml   Filed Weights   04/02/13 2132 04/03/13 0500 04/04/13 0504  Weight: 64.7 kg (142 lb 10.2 oz) 67.3 kg (148 lb 5.9 oz) 68.9 kg (151 lb 14.4 oz)    Exam:   General:  Alert in no distress.   Cardiovascular: S 1, S 2 RRR  Respiratory: CTA  Abdomen: Bs present, soft, nt  Musculoskeletal: no edema.   Data Reviewed: Basic Metabolic Panel:  Recent Labs Lab 04/02/13 2330 04/04/13 0436  NA 144 138  K 4.5 4.0  CL 100 97  CO2 28 26  GLUCOSE 102* 87  BUN  30* 45*  CREATININE 5.81* 7.73*  CALCIUM 8.5 7.9*  MG 2.0  --   PHOS 4.5  --    Liver Function Tests:  Recent Labs Lab 04/02/13 2330  AST 50*  ALT 17  ALKPHOS 100  BILITOT 0.3  PROT 6.3  ALBUMIN 3.1*   No results found for this basename: LIPASE, AMYLASE,  in the last 168 hours No results found for this basename: AMMONIA,  in the last 168 hours CBC:  Recent Labs Lab 04/02/13 2330 04/04/13 0436  WBC 6.6 5.6  NEUTROABS 4.4  --   HGB 9.6* 8.1*  HCT 29.0* 23.9*  MCV 102.5* 102.1*  PLT 94* 81*   Cardiac Enzymes:  Recent Labs Lab 04/02/13 2306 04/03/13 0445 04/03/13 1053  TROPONINI <0.30 <0.30 <0.30   BNP (last 3 results) No results found for this basename: PROBNP,  in the last 8760 hours CBG:  Recent Labs Lab 04/02/13 2133 04/03/13 0742 04/03/13 0906 04/03/13 1133 04/03/13 1629  GLUCAP 109* 64* 108* 113* 167*    Recent Results (from the past 240 hour(s))  MRSA PCR SCREENING     Status: None   Collection Time    04/02/13  9:33 PM      Result Value Ref Range Status   MRSA by PCR NEGATIVE  NEGATIVE Final   Comment:  The GeneXpert MRSA Assay (FDA     approved for NASAL specimens     only), is one component of a     comprehensive MRSA colonization     surveillance program. It is not     intended to diagnose MRSA     infection nor to guide or     monitor treatment for     MRSA infections.  CULTURE, BLOOD (ROUTINE X 2)     Status: None   Collection Time    04/02/13 11:35 PM      Result Value Ref Range Status   Specimen Description BLOOD RIGHT ARM   Final   Special Requests BOTTLES DRAWN AEROBIC AND ANAEROBIC 10CC   Final   Culture  Setup Time     Final   Value: 04/03/2013 03:32     Performed at [facility]   Culture     Final   Value:        BLOOD CULTURE RECEIVED NO GROWTH TO DATE CULTURE WILL BE HELD FOR 5 DAYS BEFORE ISSUING A FINAL NEGATIVE REPORT     Performed at [facility]   Report Status PENDING   Incomplete   CULTURE, BLOOD (ROUTINE X 2)     Status: None   Collection Time    04/02/13 11:44 PM      Result Value Ref Range Status   Specimen Description BLOOD RIGHT HAND   Final   Special Requests BOTTLES DRAWN AEROBIC ONLY 5CC   Final   Culture  Setup Time     Final   Value: 04/03/2013 03:32     Performed at [facility]   Culture     Final   Value:        BLOOD CULTURE RECEIVED NO GROWTH TO DATE CULTURE WILL BE HELD FOR 5 DAYS BEFORE ISSUING A FINAL NEGATIVE REPORT     Performed at [facility]   Report Status PENDING   Incomplete     Studies: Dg Chest Port 1 View  04/03/2013   CLINICAL DATA:  Pneumonia.  EXAM: PORTABLE CHEST - 1 VIEW  COMPARISON:  04/02/2013  FINDINGS: There is a left IJ catheter in stable position.  Stable cardiomegaly. Better aerated lungs. No definitive pneumonia. No edema or effusion. Skin edges bilaterally. No visible pneumothorax.  IMPRESSION: Improved lung aeration.  No focal infiltrate.   Electronically Signed   By: Jorje Guild M.D.   On: 04/03/2013 05:14    Scheduled Meds: . allopurinol  100 mg Oral Daily  . antiseptic oral rinse  15 mL Mouth Rinse BID  . aspirin EC  81 mg Oral Daily  . carvedilol  12.5 mg Oral QHS  . cholecalciferol  5,000 Units Oral Daily  . folic acid  1 mg Oral Daily  . heparin  5,000 Units Subcutaneous 3 times per day  . lamoTRIgine  100 mg Oral QHS  . lamoTRIgine  50 mg Oral Q breakfast  . levothyroxine  75 mcg Oral QAC breakfast  . mirtazapine  30 mg Oral QHS  . multivitamin  1 tablet Oral QHS  . pantoprazole  40 mg Oral Daily  . piperacillin-tazobactam (ZOSYN)  IV  2.25 g Intravenous 3 times per day  . sertraline  25 mg Oral Daily  . simvastatin  20 mg Oral QHS  . [START ON 04/05/2013] vancomycin  750 mg Intravenous Q M,W,F-HD   Continuous Infusions:   Principal Problem:   Hypotension, unspecified Active Problems:   HYPOTHYROIDISM  ANEMIA OF CHRONIC DISEASE   AORTIC STENOSIS   RENAL FAILURE, END STAGE    Fever, unspecified   Acute respiratory failure   CAP (community acquired pneumonia)   Seizures   Sepsis    Time spent: 35 minutes.     Kimber Relic  [facility] Pager [phone]. If 7PM-7AM, please contact night-coverage at [email or web address], password [facility] 04/04/2013, 1:25 PM  LOS: 2 days

## 2013-04-04 NOTE — Progress Notes (Signed)
Subjective:  Comfortable in bed, no complaints, no dyspnea  Objective: Vital signs in last 24 hours: Temp:  [97.7 F (36.5 C)-98.3 F (36.8 C)] 98.3 F (36.8 C) (03/08 0504) Pulse Rate:  [67-89] 67 (03/08 0504) Resp:  [12-20] 18 (03/08 0504) BP: (109-131)/(42-60) 111/54 mmHg (03/08 0504) SpO2:  [93 %-99 %] 94 % (03/08 0504) Weight:  [68.9 kg (151 lb 14.4 oz)] 68.9 kg (151 lb 14.4 oz) (03/08 0504) Weight change: 4.2 kg (9 lb 4.2 oz)  Intake/Output from previous day: 03/07 0701 - 03/08 0700 In: 1020 [P.O.:600; I.V.:320; IV Piggyback:100] Out: -    Lab Results:  Recent Labs  04/02/13 2330 04/04/13 0436  WBC 6.6 5.6  HGB 9.6* 8.1*  HCT 29.0* 23.9*  PLT 94* 81*   BMET:  Recent Labs  04/02/13 2330 04/04/13 0436  NA 144 138  K 4.5 4.0  CL 100 97  CO2 28 26  GLUCOSE 102* 87  BUN 30* 45*  CREATININE 5.81* 7.73*  CALCIUM 8.5 7.9*  ALBUMIN 3.1*  --    No results found for this basename: PTH,  in the last 72 hours Iron Studies: No results found for this basename: IRON, TIBC, TRANSFERRIN, FERRITIN,  in the last 72 hours  EXAM: General appearance:  Alert, in no apparent distress Resp:  CTA without rales, rhonchi, or wheezes Cardio:  RRR with Gr III/VI systolic murmur, no rub GI: + BS, soft and nontender Extremities:  No edema Access:  AVF @ LFA with + bruit  Dialysis Orders: MWF @ [facility]  3:15 64 kg 2K/2.25Ca 450/A1.5 Profile 2 Heparin 0 AVF @ LFA  Hectorol 1 mcg Epogen 1200 U Venofer 0  Assessment/Plan: 1. Fever - with hypotension, possibly CAP, although CXR clear; influenza negative, BCs pending, on Zosyn.  2. ESRD - HD on MWF @ [facility], K 4, ran 2:20 of 3:15 on 3/6, stable. Next HD tomorrow.  3. Hypotension/volume - BP 111/54 now, initially requiring IVF & Levophed @ [facility], DC'd here; wt 68.9 kg, but CXR clear.  4. Anemia - Hgb down to 8.1, on outpatient Epogen. Aranesp with HD.  5. Metabolic bone disease - Ca 7.9, P 4.5, last iPTH 247; Hectorol 1 mcg, no  binders.  6. Nutrition - Alb 3.1, renal diet & vitamin.  7. Hypothyroidism - on Levothyroxine.  8. Hx Guillain-Barre syndrome - diagnosed in 2003.     LOS: 2 days   LYLES,Tonya 04/04/2013,7:16 AM  Patient seen and examined, agree with above note with above modifications. See previous consult note that was completed by me today 3/8.  I am going to increase treatment time tomorrow  And will increase it permanently due to I think it having something to do with the issue the other day Corliss Parish, MD 04/04/2013

## 2013-04-05 LAB — RENAL FUNCTION PANEL
ALBUMIN: 2.7 g/dL — AB (ref 3.5–5.2)
BUN: 57 mg/dL — ABNORMAL HIGH (ref 6–23)
CHLORIDE: 100 meq/L (ref 96–112)
CO2: 23 mEq/L (ref 19–32)
CREATININE: 9.61 mg/dL — AB (ref 0.50–1.35)
Calcium: 8.3 mg/dL — ABNORMAL LOW (ref 8.4–10.5)
GFR, EST AFRICAN AMERICAN: 5 mL/min — AB (ref >90)
GFR, EST NON AFRICAN AMERICAN: 4 mL/min — AB (ref >90)
Glucose, Bld: 107 mg/dL — ABNORMAL HIGH (ref 70–99)
POTASSIUM: 4.5 meq/L (ref 3.7–5.3)
Phosphorus: 6.2 mg/dL — ABNORMAL HIGH (ref 2.3–4.6)
Sodium: 142 mEq/L (ref 137–147)

## 2013-04-05 LAB — CBC
HEMATOCRIT: 27.2 % — AB (ref 39.0–52.0)
Hemoglobin: 9.3 g/dL — ABNORMAL LOW (ref 13.0–17.0)
MCH: 33.8 pg (ref 26.0–34.0)
MCHC: 34.2 g/dL (ref 30.0–36.0)
MCV: 98.9 fL (ref 78.0–100.0)
PLATELETS: 91 10*3/uL — AB (ref 150–400)
RBC: 2.75 MIL/uL — AB (ref 4.22–5.81)
RDW: 14.3 % (ref 11.5–15.5)
WBC: 4 10*3/uL (ref 4.0–10.5)

## 2013-04-05 NOTE — Clinical Social Work Psychosocial (Signed)
Clinical Social Work Department BRIEF PSYCHOSOCIAL ASSESSMENT 04/05/2013  Patient:  [name], [name]     Account Number:  [id number]     Admit date:  04/02/2013  Clinical Social Worker:  Frederico Hamman  Date/Time:  04/05/2013 05:23 AM  Referred by:  Physician  Date Referred:  04/05/2013 Referred for  SNF Placement   Other Referral:   Interview type:  Family Other interview type:   CSW talked with patient's wife, [name] [phone])    PSYCHOSOCIAL DATA Living Status:  WIFE Admitted from facility:   Level of care:   Primary support name:  [name] Primary support relationship to patient:  SPOUSE Degree of support available:   Good support - wife is concerned about patient's well-being.    CURRENT CONCERNS Current Concerns  Post-Acute Placement   Other Concerns:    SOCIAL WORK ASSESSMENT / PLAN MD advised CSW that patient needs ST rehab and that she talked with patient and he is agreeable to rehab. CSW visited room shortly afterwards and patient in HD. CSW contacted wife and informed her of consult from MD regarding the need for ST rehab. Mrs. [name] is in agreement and reported that husband had been to rehab before. SNF search and facilities discussed and questions answered.   Assessment/plan status:  Psychosocial Support/Ongoing Assessment of Needs Other assessment/ plan:   Information/referral to community resources:   CSW talked with patient's wife regarding facilities in [facility]    PATIENT'S/FAMILY'S [place]: CSW unable to speak with patient as he was in HD. Contact made with wife who is concerned and was attentive and open to patient going to [facility] re[facility]. CSW will talk with wife Tuesday, 3/10 after 10:30 am as she plans to be here to visit with patient. Mrs. [name] appreciative of CSW's contact and pleased that CSW will visit with her on Tuesday regarding facility responses.

## 2013-04-05 NOTE — Progress Notes (Signed)
Subjective:  No sob/ for hd today/ co bilat hip discomfort "with history of arthritis both hips" per his history/ and walks into hd with rolling walker as op . Objective Vital signs in last 24 hours: Filed Vitals:   04/04/13 2151 04/05/13 0317 04/05/13 0614 04/05/13 0830  BP: 173/53  152/55 171/63  Pulse:   55 61  Temp:   97.8 F (36.6 C) 97.8 F (36.6 C)  TempSrc:   Oral Oral  Resp:   18 19  Height:      Weight:  67.8 kg (149 lb 7.6 oz)    SpO2:   96% 98%   Weight change: -1.1 kg (-2 lb 6.8 oz)  Labs: Basic Metabolic Panel:  Recent Labs Lab 04/02/13 2330 04/04/13 0436  NA 144 138  K 4.5 4.0  CL 100 97  CO2 28 26  GLUCOSE 102* 87  BUN 30* 45*  CREATININE 5.81* 7.73*  CALCIUM 8.5 7.9*  PHOS 4.5  --    Liver Function Tests:  Recent Labs Lab 04/02/13 2330  AST 50*  ALT 17  ALKPHOS 100  BILITOT 0.3  PROT 6.3  ALBUMIN 3.1*   No results found for this basename: LIPASE, AMYLASE,  in the last 168 hours No results found for this basename: AMMONIA,  in the last 168 hours  CBC:  Recent Labs Lab 04/02/13 2330 04/04/13 0436  WBC 6.6 5.6  NEUTROABS 4.4  --   HGB 9.6* 8.1*  HCT 29.0* 23.9*  MCV 102.5* 102.1*  PLT 94* 81*   Cardiac Enzymes:  Recent Labs Lab 04/02/13 2306 04/03/13 0445 04/03/13 1053  TROPONINI <0.30 <0.30 <0.30   CBG:  Recent Labs Lab 04/02/13 2133 04/03/13 0742 04/03/13 0906 04/03/13 1133 04/03/13 1629  GLUCAP 109* 64* 108* 113* 167*    Studies/Results: No results found. Medications:   . allopurinol  100 mg Oral Daily  . antiseptic oral rinse  15 mL Mouth Rinse BID  . aspirin EC  81 mg Oral Daily  . carvedilol  12.5 mg Oral QHS  . cholecalciferol  5,000 Units Oral Daily  . docusate sodium  100 mg Oral BID  . folic acid  1 mg Oral Daily  . heparin  5,000 Units Subcutaneous 3 times per day  . lamoTRIgine  100 mg Oral QHS  . lamoTRIgine  50 mg Oral Q breakfast  . levothyroxine  75 mcg Oral QAC breakfast  . mirtazapine  30  mg Oral QHS  . multivitamin  1 tablet Oral QHS  . pantoprazole  40 mg Oral Daily  . piperacillin-tazobactam (ZOSYN)  IV  2.25 g Intravenous 3 times per day  . saccharomyces boulardii  250 mg Oral BID  . sertraline  25 mg Oral Daily  . simvastatin  20 mg Oral QHS  . vancomycin  750 mg Intravenous Q M,W,F-HD    Physical Exam:  General appearance: Alert nad reading paper in bed  Resp: CTA bilat no  rales, rhonchi, or wheezes  Cardio: RRR with Gr III/VISEM LSB, no rub  GI: + BS, soft nondistended and nontender  Extremities: No pedal edema  Access: AVF @ LFA  + bruit   Dialysis Orders: MWF @ [facility]  3:15 64 kg 2K/2.25Ca 450/A1.5 Profile 2 Heparin 0 AVF @ LFA  Hectorol 1 mcg Epogen 1200 U Venofer 0   Assessment/Plan:  1. Fever - with hypotension, possibly CAP, although CXR clear; influenza negative, BCs pending, on Zosyn.  2. ESRD - HD on MWF @ [facility], K  4.4 ran 2:20 of 3:15 on 3/6,  HD today.  3. Hypotension/volume - BP 171/63 this am  On admit  requiring IVF & Levophed @ [facility],  wt 68.9 kg,  with CXR clear at [facility] / hd time only 3 hrs 69min  And noted by DR. [place] increased hd time to 3 hrs 72min to  help prevent bp drop on hd/ am wt 67.3 with edw 64 may also need higher edw fu bp and wt today  Post hd 4. Anemia - Hgb down to 8.1, fu pre hd today/ on outpatient Epogen. Aranesp with HD.  5. Metabolic bone disease - Ca 7.9, P 4.5, last iPTH 247; Hectorol 1 mcg, no binders.  6. Nutrition - Alb 3.1, renal diet & vitamin.  7. Hypothyroidism - on Levothyroxine.  8. Hx Guillain-Barre syndrome - diagnosed in 2003 9. DJD per admit  Ernest Haber, PA-C [place] [phone] 04/05/2013,9:49 AM  LOS: 3 days   Pt seen, examined and agree w A/P as above. HD today, UF 3kg to dry wt as tolerated.  Feeling a little better.  Kelly Splinter MD pager [phone]    cell [phone] 04/05/2013, 1:53 PM

## 2013-04-05 NOTE — Evaluation (Signed)
Occupational Therapy Evaluation Patient Details Name: [name] MRN: [id number] DOB: Nov 16, 1926 Today's Date: 04/05/2013 Time: 1101-1120 OT Time Calculation (min): 19 min  OT Assessment / Plan / Recommendation History of present illness  [name] is a 80 y.o. male with a history of dementia, aortic stenosis, Guillain-Barre syndrome, and ESRD on dialysis on MWF at the [facility] whose dialysis was stopped after 2:20 of his 3:15 treatment yesterday secondary to restlessness, chills, and low blood pressure.  Found to have sepsis likely due to pneumonia.     Clinical Impression   Pt demos decline in function with ADLs and ADL mobility safety with decreased strength, balance and endurance. Pt would benefit from acute OT services to address impairments to increase level of function and safety    OT Assessment  Patient needs continued OT Services    Follow Up Recommendations  SNF;Supervision/Assistance - 24 hour    Barriers to Discharge Decreased caregiver support    Equipment Recommendations   TBD at next level of care   Recommendations for Other Services    Frequency  Min 2X/week    Precautions / Restrictions Precautions Precautions: Fall Restrictions Weight Bearing Restrictions: No   Pertinent Vitals/Pain 0/10 before activity, 10/10 B thighs during mobility    ADL  Grooming: Performed;Wash/dry hands;Wash/dry face;Supervision/safety;Set up Where Assessed - Grooming: Supported sitting Upper Body Bathing: Simulated;Minimal assistance Where Assessed - Upper Body Bathing: Supported sitting Lower Body Bathing: Maximal assistance;Simulated Upper Body Dressing: Performed;Minimal assistance Lower Body Dressing: +1 Total assistance Toilet Transfer: Simulated;Maximal assistance Toilet Transfer Method: Sit to stand Toileting - Clothing Manipulation and Hygiene: +1 Total assistance Where Assessed - Camera operator Manipulation and Hygiene: Standing Tub/Shower  Transfer Method: Not assessed Equipment Used: Gait belt;Rolling walker    OT Diagnosis: Generalized weakness;Acute pain  OT Problem List: Decreased strength;Pain;Impaired balance (sitting and/or standing);Decreased activity tolerance OT Treatment Interventions: Self-care/ADL training;Therapeutic exercise;Patient/family education;Neuromuscular education;Balance training;Therapeutic activities;DME and/or AE instruction   OT Goals(Current goals can be found in the care plan section) Acute Rehab OT Goals Patient Stated Goal: To return to independent OT Goal Formulation: With patient Time For Goal Achievement: 04/12/13 Potential to Achieve Goals: Good ADL Goals Pt Will Perform Grooming: with min assist;standing;with min guard assist Pt Will Perform Upper Body Bathing: with min guard assist;with supervision;with set-up;sitting Pt Will Perform Lower Body Bathing: with mod assist;with min assist;sitting/lateral leans;sit to/from stand Pt Will Perform Upper Body Dressing: with min guard assist;with supervision;with set-up;sitting Pt Will Transfer to Toilet: with mod assist;with min assist;bedside commode;ambulating;regular height toilet;grab bars Pt Will Perform Toileting - Clothing Manipulation and hygiene: with max assist;with mod assist;sitting/lateral leans;sit to/from stand  Visit Information  Last OT Received On: 04/05/13 Assistance Needed: +2 History of Present Illness:  [name] is a 80 y.o. male with a history of dementia, aortic stenosis, Guillain-Barre syndrome, and ESRD on dialysis on MWF at the [facility] whose dialysis was stopped after 2:20 of his 3:15 treatment yesterday secondary to restlessness, chills, and low blood pressure.  Found to have sepsis likely due to pneumonia.         [place] expects to be discharged to:: Private residence Living Arrangements: Spouse/significant other Available Help at Discharge:  Family Type of Home: House Home Access: Stairs to enter Technical brewer of Steps: 4 Entrance Stairs-Rails: Right Home Layout: One level [place]: Clinical cytogeneticist - 2 wheels Prior Function Level of Independence: Independent with assistive device(s) Communication Communication:  No difficulties Dominant Hand: Right         Vision/Perception Vision - History Baseline Vision: Wears glasses only for reading Patient Visual Report: No change from baseline Perception Perception: Within Functional Limits   Cognition  Cognition Arousal/Alertness: Awake/alert Behavior During Therapy: WFL for tasks assessed/performed Overall Cognitive Status: Within Functional Limits for tasks assessed    Extremity/Trunk Assessment Upper Extremity Assessment Upper Extremity Assessment: Overall WFL for tasks assessed;Generalized weakness Lower Extremity Assessment Lower Extremity Assessment: Defer to PT evaluation RLE Deficits / Details: Ankle AROM and strength WFL, knee extension strength 4/5, hip flexion limited due to pain about 30 degrees in supine. RLE Sensation: history of peripheral neuropathy LLE Deficits / Details: Ankle AROM and strength WFL, knee extension strength 4/5, hip flexion limited due to pain about 30 degrees in supine. LLE Sensation: history of peripheral neuropathy     Mobility Bed Mobility Overal bed mobility: Needs Assistance Bed Mobility: Supine to Sit Supine to sit: Max assist General bed mobility comments: pt up in recliner Transfers Overall transfer level: Needs assistance Equipment used: Rolling walker (2 wheeled) Transfers: Sit to/from Stand Sit to Stand: Max assist General transfer comment: from recliner, from EOB        Balance Balance Overall balance assessment: Needs assistance;History of Falls Sitting-balance support: Bilateral upper extremity supported;Feet supported Sitting balance-Leahy Scale: Poor Sitting balance - Comments: Initially  sat behind patient so he could lean on me due to severe pain in thighs coming up to sit; sat about 5-6 minutes before attempting to stand Postural control: Posterior lean Standing balance support: Bilateral upper extremity supported Standing balance-Leahy Scale: Poor Standing balance comment: needs bilateral UE support for balance (uses walker at home; falls at home when trying to walk without walker)   End of Session OT - End of Session Equipment Utilized During Treatment: Gait belt;Rolling walker Activity Tolerance: Patient limited by fatigue;Patient limited by pain Patient left: in chair;with call bell/phone within reach  GO     Britt Bottom 04/05/2013, 1:14 PM

## 2013-04-05 NOTE — Progress Notes (Signed)
[facility] PROGRESS NOTE  [name] MVH:[id number] DOB: 24-Feb-1926 DOA: 04/02/2013 PCP: Noni Saupe., MD  Assessment/Plan: 1-Acute respiratory failure:  Presumed CAP - per cxr at [place], no obvious infiltrate now  Supplemental O2 as needed  Continue with vancomycin with dialysis and Zosyn day 4.  Will transition him to Levaquin at time of discharge.   2-Severe sepsis  Presume to PNA.  Was on Levophed at [place].  BP stable.  Resume home coreg  EKG in am - intermittent irregular HR Blood culture no growth to date.   3-ESRD; on hemodialysis. Renal following.   4-Anemia of chronic disease  F/u cbc   5-Thrombocytopenia; chronic. Platelet at 98 in 2011.  Follow trend.  Platelet increasing.   6-Hypothyroidism; continue with synthroid.  7-History of seizure; Continue with Lamictal.  8-History Hermelinda Medicus.   Code Status: Full Code.  Family Communication: none at bedside.  Disposition Plan: SNF when bed available.    Consultants:  Renal.   Procedures// events.  L IJ CVL ([place]) 3/6>>> 3/6>>>Hypotension at [facility]; transferred to [facility]  3/6 CT head ([place]>>> neg    Antibiotics:  Zosyn 3-6  Vancomycin 3-6  HPI/Subjective: Feeling well, no complaints. On dialysis.   Objective: Filed Vitals:   04/05/13 1754  BP: 153/131  Pulse: 77  Temp: 97.7 F (36.5 C)  Resp: 19    Intake/Output Summary (Last 24 hours) at 04/05/13 1817 Last data filed at 04/05/13 1706  Gross per 24 hour  Intake    710 ml  Output   2900 ml  Net  -2190 ml   Filed Weights   04/05/13 0317 04/05/13 1243 04/05/13 1300  Weight: 67.8 kg (149 lb 7.6 oz) 68 kg (149 lb 14.6 oz) 68 kg (149 lb 14.6 oz)    Exam:   General:  Alert in no distress.   Cardiovascular: S 1, S 2 RRR  Respiratory: CTA  Abdomen: Bs present, soft, nt  Musculoskeletal: no edema.   Data Reviewed: Basic Metabolic Panel:  Recent Labs Lab 04/02/13 2330  04/04/13 0436 04/05/13 1300  NA 144 138 142  K 4.5 4.0 4.5  CL 100 97 100  CO2 28 26 23   GLUCOSE 102* 87 107*  BUN 30* 45* 57*  CREATININE 5.81* 7.73* 9.61*  CALCIUM 8.5 7.9* 8.3*  MG 2.0  --   --   PHOS 4.5  --  6.2*   Liver Function Tests:  Recent Labs Lab 04/02/13 2330 04/05/13 1300  AST 50*  --   ALT 17  --   ALKPHOS 100  --   BILITOT 0.3  --   PROT 6.3  --   ALBUMIN 3.1* 2.7*   No results found for this basename: LIPASE, AMYLASE,  in the last 168 hours No results found for this basename: AMMONIA,  in the last 168 hours CBC:  Recent Labs Lab 04/02/13 2330 04/04/13 0436 04/05/13 1300  WBC 6.6 5.6 4.0  NEUTROABS 4.4  --   --   HGB 9.6* 8.1* 9.3*  HCT 29.0* 23.9* 27.2*  MCV 102.5* 102.1* 98.9  PLT 94* 81* 91*   Cardiac Enzymes:  Recent Labs Lab 04/02/13 2306 04/03/13 0445 04/03/13 1053  TROPONINI <0.30 <0.30 <0.30   BNP (last 3 results) No results found for this basename: PROBNP,  in the last 8760 hours CBG:  Recent Labs Lab 04/02/13 2133 04/03/13 0742 04/03/13 0906 04/03/13 1133 04/03/13 1629  GLUCAP 109* 64* 108* 113* 167*    Recent Results (  from the past 240 hour(s))  MRSA PCR SCREENING     Status: None   Collection Time    04/02/13  9:33 PM      Result Value Ref Range Status   MRSA by PCR NEGATIVE  NEGATIVE Final   Comment:            The GeneXpert MRSA Assay (FDA     approved for NASAL specimens     only), is one component of a     comprehensive MRSA colonization     surveillance program. It is not     intended to diagnose MRSA     infection nor to guide or     monitor treatment for     MRSA infections.  CULTURE, BLOOD (ROUTINE X 2)     Status: None   Collection Time    04/02/13 11:35 PM      Result Value Ref Range Status   Specimen Description BLOOD RIGHT ARM   Final   Special Requests BOTTLES DRAWN AEROBIC AND ANAEROBIC 10CC   Final   Culture  Setup Time     Final   Value: 04/03/2013 03:32     Performed at [facility]   Culture     Final   Value:        BLOOD CULTURE RECEIVED NO GROWTH TO DATE CULTURE WILL BE HELD FOR 5 DAYS BEFORE ISSUING A FINAL NEGATIVE REPORT     Performed at [facility]   Report Status PENDING   Incomplete  CULTURE, BLOOD (ROUTINE X 2)     Status: None   Collection Time    04/02/13 11:44 PM      Result Value Ref Range Status   Specimen Description BLOOD RIGHT HAND   Final   Special Requests BOTTLES DRAWN AEROBIC ONLY 5CC   Final   Culture  Setup Time     Final   Value: 04/03/2013 03:32     Performed at [facility]   Culture     Final   Value:        BLOOD CULTURE RECEIVED NO GROWTH TO DATE CULTURE WILL BE HELD FOR 5 DAYS BEFORE ISSUING A FINAL NEGATIVE REPORT     Performed at [facility]   Report Status PENDING   Incomplete     Studies: No results found.  Scheduled Meds: . allopurinol  100 mg Oral Daily  . antiseptic oral rinse  15 mL Mouth Rinse BID  . aspirin EC  81 mg Oral Daily  . carvedilol  12.5 mg Oral QHS  . cholecalciferol  5,000 Units Oral Daily  . docusate sodium  100 mg Oral BID  . folic acid  1 mg Oral Daily  . heparin  5,000 Units Subcutaneous 3 times per day  . lamoTRIgine  100 mg Oral QHS  . lamoTRIgine  50 mg Oral Q breakfast  . levothyroxine  75 mcg Oral QAC breakfast  . mirtazapine  30 mg Oral QHS  . multivitamin  1 tablet Oral QHS  . pantoprazole  40 mg Oral Daily  . piperacillin-tazobactam (ZOSYN)  IV  2.25 g Intravenous 3 times per day  . saccharomyces boulardii  250 mg Oral BID  . sertraline  25 mg Oral Daily  . simvastatin  20 mg Oral QHS  . vancomycin  750 mg Intravenous Q M,W,F-HD   Continuous Infusions:   Principal Problem:   Hypotension, unspecified Active Problems:   HYPOTHYROIDISM   ANEMIA OF  CHRONIC DISEASE   AORTIC STENOSIS   RENAL FAILURE, END STAGE   Fever, unspecified   Acute respiratory failure   CAP (community acquired pneumonia)   Seizures   Sepsis    Time spent: 30 minutes.      Kimber Relic  [facility] Pager [phone]. If 7PM-7AM, please contact night-coverage at [email or web address], password [facility] 04/05/2013, 6:17 PM  LOS: 3 days

## 2013-04-05 NOTE — Evaluation (Signed)
Physical Therapy Evaluation Patient Details Name: AMEEN MOSTAFA MRN: [id number] DOB: 14-Nov-1926 Today's Date: 04/05/2013 Time: 9767-3419 PT Time Calculation (min): 34 min  PT Assessment / Plan / Recommendation History of Present Illness   PAMELA MADDY is a 80 y.o. male with a history of dementia, aortic stenosis, Guillain-Barre syndrome, and ESRD on dialysis on MWF at the [facility] whose dialysis was stopped after 2:20 of his 3:15 treatment yesterday secondary to restlessness, chills, and low blood pressure.  Found to have sepsis likely due to pneumonia.    Clinical Impression  Patient presents with decreased independence with mobility due to deficits listed below (PT problem list).  He will benefit from skilled PT in the acute setting to allow return home with wife following SNF level rehab.  Limited mainly bu pain in legs when sitting up he feels potentially due to injury when being lifted from dialysis chair.    PT Assessment  Patient needs continued PT services    Follow Up Recommendations  SNF;Supervision/Assistance - 24 hour    Does the patient have the potential to tolerate intense rehabilitation    N/A  Barriers to Discharge  None      Equipment Recommendations  None recommended by PT    Recommendations for Other Services   None  Frequency Min 3X/week    Precautions / Restrictions Precautions Precautions: Fall Restrictions Weight Bearing Restrictions: No   Pertinent Vitals/Pain 10/10 in thighs when sitting up on edge of bed; seated in recliner down to no pain      Mobility  Bed Mobility Overal bed mobility: Needs Assistance Bed Mobility: Supine to Sit Supine to sit: Max assist General bed mobility comments: use of rail and increased time due to severe thigh pain left >right with coming up to sit (reports had potential pulled muscles due to lifting from chair at dialysis to stretcher) Transfers Overall transfer level: Needs assistance Equipment  used: Rolling walker (2 wheeled) Transfers: Sit to/from Stand Sit to Stand: From elevated surface;Mod assist General transfer comment: raised bed up to high position to improve positioning of feet under legs due to left leg out in sitting due to increased pain with hip flexion Ambulation/Gait Ambulation/Gait assistance: Min assist Ambulation Distance (Feet): 12 Feet Assistive device: Rolling walker (2 wheeled) Gait Pattern/deviations: Step-to pattern;Decreased stride length;Shuffle General Gait Details: able to take steps with walker with pain improved, still needs support for safety    Exercises General Exercises - Lower Extremity Heel Slides: AAROM;Both;5 reps;Supine   PT Diagnosis: Abnormality of gait;Acute pain  PT Problem List: Decreased strength;Decreased range of motion;Decreased activity tolerance;Decreased balance;Pain;Decreased mobility PT Treatment Interventions: DME instruction;Therapeutic exercise;Gait training;Balance training;Functional mobility training;Therapeutic activities;Patient/family education;Stair training     PT Goals(Current goals can be found in the care plan section) Acute Rehab PT Goals Patient Stated Goal: To return to independent PT Goal Formulation: With patient Time For Goal Achievement: 04/19/13 Potential to Achieve Goals: Good  Visit Information  Last PT Received On: 04/05/13 Assistance Needed: +2 History of Present Illness:  JONATHANDAVID MARLETT is a 80 y.o. male with a history of dementia, aortic stenosis, Guillain-Barre syndrome, and ESRD on dialysis on MWF at the [facility] whose dialysis was stopped after 2:20 of his 3:15 treatment yesterday secondary to restlessness, chills, and low blood pressure.  Found to have sepsis likely due to pneumonia.         Prior Functioning  Home Living Family/patient expects to be discharged to:: Private residence Living Arrangements:  Spouse/significant other Available Help at Discharge: Family Type  of Home: House Home Access: Stairs to enter Technical brewer of Steps: 4 Entrance Stairs-Rails: Right Home Layout: One level Home Equipment: Clinical cytogeneticist - 2 wheels Prior Function Level of Independence: Independent with assistive device(s) Communication Communication: No difficulties Dominant Hand: Right    Cognition  Cognition Arousal/Alertness: Awake/alert Behavior During Therapy: WFL for tasks assessed/performed Overall Cognitive Status: Within Functional Limits for tasks assessed    Extremity/Trunk Assessment Lower Extremity Assessment Lower Extremity Assessment: RLE deficits/detail;LLE deficits/detail RLE Deficits / Details: Ankle AROM and strength WFL, knee extension strength 4/5, hip flexion limited due to pain about 30 degrees in supine. RLE Sensation: history of peripheral neuropathy LLE Deficits / Details: Ankle AROM and strength WFL, knee extension strength 4/5, hip flexion limited due to pain about 30 degrees in supine. LLE Sensation: history of peripheral neuropathy   Balance Balance Overall balance assessment: Needs assistance;History of Falls Sitting-balance support: Bilateral upper extremity supported;Feet supported Sitting balance-Leahy Scale: Poor Sitting balance - Comments: Initially sat behind patient so he could lean on me due to severe pain in thighs coming up to sit; sat about 5-6 minutes before attempting to stand Postural control: Posterior lean Standing balance support: Bilateral upper extremity supported Standing balance-Leahy Scale: Poor Standing balance comment: needs bilateral UE support for balance (uses walker at home; falls at home when trying to walk without walker)  End of Session PT - End of Session Equipment Utilized During Treatment: Gait belt Activity Tolerance: Patient limited by pain Patient left: in chair;with call bell/phone within reach  GP     [facility] 04/05/2013, 11:45 AM Magda Kiel, [address] 04/05/2013

## 2013-04-05 NOTE — Clinical Social Work Placement (Addendum)
Clinical Social Work Department CLINICAL SOCIAL WORK PLACEMENT NOTE 04/05/2013  Patient:  [name], [name]  Account Number:  [id number] Admit date:  04/02/2013  Clinical Social Worker:  Errick Salts Givens, LCSW  Date/time:  04/05/2013 05:31 AM  Clinical Social Work is seeking post-discharge placement for this patient at the following level of care:   SKILLED NURSING   (*CSW will update this form in Epic as items are completed)     Patient/family provided with [place] Department of Clinical Social Work's list of facilities offering this level of care within the geographic area requested by the patient (or if unable, by the patient's family).  04/05/2013  Patient/family informed of their freedom to choose among providers that offer the needed level of care, that participate in Medicare, Medicaid or managed care program needed by the patient, have an available bed and are willing to accept the patient. Patient has [place] - preauthorization needed.    Patient/family informed of [facility]' ownership interest in [facility], as well as of the fact that they are under no obligation to receive care at this facility.  PASARR submitted to EDS on 02/18/2006 PASARR number received from EDS on 02/18/2006  FL2 transmitted to all facilities in geographic area requested by pt/family on  04/05/2013 FL2 transmitted to all facilities within larger geographic area on   Patient informed that his/her managed care company has contracts with or will negotiate with  certain facilities, including the following:     Patient/family informed of bed offers received: 04/06/13  Patient chooses bed at [facility] skilled nursing facility Physician recommends and patient chooses bed at    Patient to be transferred to [facility] on 04/06/13   Patient to be transferred to facility by ambulance  The following physician request were entered in  Epic:  Additional Comments:

## 2013-04-05 NOTE — Procedures (Signed)
I was present at this dialysis session, have reviewed the session itself and made  appropriate changes  Kelly Splinter MD (pgr) [phone]    ([phone] 04/05/2013, 1:55 PM

## 2013-04-06 MED ORDER — SACCHAROMYCES BOULARDII 250 MG PO CAPS: 250.0000 mg | ORAL_CAPSULE | Freq: Two times a day (BID) | ORAL | Status: DC

## 2013-04-06 MED ORDER — LEVOFLOXACIN 500 MG PO TABS
500.0000 mg | ORAL_TABLET | ORAL | Status: DC
  Administered 2013-04-06: 500 mg via ORAL
  Filled 2013-04-06 (×2): qty 1

## 2013-04-06 MED ORDER — BOOST / RESOURCE BREEZE PO LIQD
1.0000 | Freq: Three times a day (TID) | ORAL | Status: DC
  Administered 2013-04-06: 1 via ORAL

## 2013-04-06 MED ORDER — LEVOFLOXACIN 500 MG PO TABS: 500.0000 mg | ORAL_TABLET | ORAL | Status: DC

## 2013-04-06 NOTE — Progress Notes (Signed)
Subjective: Feels slightly stronger with "a little more leg movement today " / noted for [facility] for rehab/ no sob and yesterday on hd bp dropped to sbp 93, 98 and he reports being symptomatic Objective Vital signs in last 24 hours: Filed Vitals:   04/05/13 1754 04/05/13 2156 04/06/13 0500 04/06/13 0535  BP: 153/131 154/62  148/66  Pulse: 77 79  67  Temp: 97.7 F (36.5 C) 98.5 F (36.9 C)  97.9 F (36.6 C)  TempSrc: Oral Oral  Oral  Resp: 19 17  16   Height:      Weight:  67.586 kg (149 lb) 68.629 kg (151 lb 4.8 oz)   SpO2: 100% 100%  100%   Weight change: 0.2 kg (7.1 oz)  Labs: Basic Metabolic Panel:  Recent Labs Lab 04/02/13 2330 04/04/13 0436 04/05/13 1300  NA 144 138 142  K 4.5 4.0 4.5  CL 100 97 100  CO2 28 26 23   GLUCOSE 102* 87 107*  BUN 30* 45* 57*  CREATININE 5.81* 7.73* 9.61*  CALCIUM 8.5 7.9* 8.3*  PHOS 4.5  --  6.2*   Liver Function Tests:  Recent Labs Lab 04/02/13 2330 04/05/13 1300  AST 50*  --   ALT 17  --   ALKPHOS 100  --   BILITOT 0.3  --   PROT 6.3  --   ALBUMIN 3.1* 2.7*   No results found for this basename: LIPASE, AMYLASE,  in the last 168 hours No results found for this basename: AMMONIA,  in the last 168 hours CBC:  Recent Labs Lab 04/02/13 2330 04/04/13 0436 04/05/13 1300  WBC 6.6 5.6 4.0  NEUTROABS 4.4  --   --   HGB 9.6* 8.1* 9.3*  HCT 29.0* 23.9* 27.2*  MCV 102.5* 102.1* 98.9  PLT 94* 81* 91*   Cardiac Enzymes:  Recent Labs Lab 04/02/13 2306 04/03/13 0445 04/03/13 1053  TROPONINI <0.30 <0.30 <0.30   CBG:  Recent Labs Lab 04/02/13 2133 04/03/13 0742 04/03/13 0906 04/03/13 1133 04/03/13 1629  GLUCAP 109* 64* 108* 113* 167*    Studies/Results: No results found. Medications:   . allopurinol  100 mg Oral Daily  . antiseptic oral rinse  15 mL Mouth Rinse BID  . aspirin EC  81 mg Oral Daily  . carvedilol  12.5 mg Oral QHS  . cholecalciferol  5,000 Units Oral Daily  . docusate sodium  100 mg Oral BID  .  folic acid  1 mg Oral Daily  . heparin  5,000 Units Subcutaneous 3 times per day  . lamoTRIgine  100 mg Oral QHS  . lamoTRIgine  50 mg Oral Q breakfast  . levothyroxine  75 mcg Oral QAC breakfast  . mirtazapine  30 mg Oral QHS  . multivitamin  1 tablet Oral QHS  . pantoprazole  40 mg Oral Daily  . piperacillin-tazobactam (ZOSYN)  IV  2.25 g Intravenous 3 times per day  . saccharomyces boulardii  250 mg Oral BID  . sertraline  25 mg Oral Daily  . simvastatin  20 mg Oral QHS  . vancomycin  750 mg Intravenous Q M,W,F-HD   Physical Exam: General appearance: Alert nad  Resp: CTA bilat no rales, rhonchi, or wheezes  Cardio: RRR with Gr III/VISEM LSB, no rub  GI: + BS, soft nondistended and nontender  Extremities: No pedal edema  Access: AVF @ LFA + bruit   Dialysis Orders: MWF @ [facility]  3:15 64 kg 2K/2.25Ca 450/A1.5 Profile 2 Heparin 0 AVF @ LFA  Hectorol 1 mcg Epogen 1200 U Venofer 0   Assessment/Plan:  1. Fever - with hypotension on admit, possibly CAP, although CXR clear; influenza negative,  Now afeb 48 hrs BCs neg so far normal wbc, on Zosyn and Vanco /  2. ESRD - HD on MWF @ [facility],, HD  On schedule 3. Hypotension/volume - BP 148/66  this am On admit requiring IVF & Levophed @ [facility], admit  cxr [facility] clear(/ edw 64) this am  wt 68.6 kg BEDWT. / 2900 uf yesterday with bp drop / hd time was only 3 hrs 76min And noted by DR. [place] increased hd time to 3 hrs 46min to help prevent bp drop on hd/BED WTS NOT ACCURATE  So in am 1.5 liter uf  On exam no excess vol  // TRY to get standing wt with pt today He thinks he can stand today 4. Anemia - Hgb  8.1> 9.3, fu  hd / on. Aranesp with HD.  5. Metabolic bone disease - Ca 9.5 corrected, P 4.5 >6.2, last iPTH 247; Hectorol 1 mcg, no binders.  Restart binder 6. Nutrition - Alb 3.1,>2.7 renal diet & vitamin. Supplement start 7. Deconditioning- for snhp rehab ot, pt following 8. Hypothyroidism - on Levothyroxine.  9. Hx Guillain-Barre  syndrome - diagnosed in 2003 10. DJD per admit   Ernest Haber, PA-C [facility] [phone] 04/06/2013,8:18 AM  LOS: 4 days   Pt seen, examined and agree w A/P as above.  Kelly Splinter MD pager [phone]    cell [phone] 04/06/2013, 10:53 AM

## 2013-04-06 NOTE — Discharge Summary (Signed)
Physician Discharge Summary  [name] SVX:[id number] DOB: [birth year] DOA: 04/02/2013  PCP: Angelina Sheriff., MD  Admit date: 04/02/2013 Discharge date: 04/06/2013  Time spent: 35 minutes  Recommendations for Outpatient Follow-up:  Needs dialysis.   Discharge Diagnoses:    Sepsis   CAP (community acquired pneumonia)   Hypotension, unspecified   HYPOTHYROIDISM   ANEMIA OF CHRONIC DISEASE   AORTIC STENOSIS   RENAL FAILURE, END STAGE   Fever, unspecified   Acute respiratory failure   Seizures    Discharge Condition: Stable.   Diet recommendation: Renal diet.   Filed Weights   04/05/13 2156 04/06/13 0500 04/06/13 0900  Weight: 67.586 kg (149 lb) 68.629 kg (151 lb 4.8 oz) 65.431 kg (144 lb 4 oz)    History of present illness:  [name] is an 80 year old male with multiple medical issues some highlights of which include end-stage renal disease status post renal transplant at [facility] which failed in 2004 now on hemodialysis, remote Guillain-Barr syndrome, aortic stenosis, and baseline dementia who presents to [facility] this evening as a transfer from [facility]. By report, during dialysis this evening he developed weakness and was unable to complete dialysis. He was transferred to [facility] where he had a fever between 100 and 103. He was also hypotensive. A central line was placed and he was started on fluid resuscitation, antibiotics, and pressors. He notes that he has not felt well for the last several weeks. He complains of a sore throat which has been present for weeks as well as a mild cough and shortness of breath. I also discussed Mr. Mcandrew recent history with his wife, [place] [phone]) who notes that he has been dizzy for several days but has not been sick for several weeks. He has also had a recent decreased exercise tolerance recently as well as a nonproductive cough.   Hospital Course:  Patient presents with hypotension,  respiratory failure. He required Pressors transiently at [facility]. Sepsis was presume to be secondary to PNA. He was treated with IV vancomycin and Zosyn for 5 days. He will be discharge on Levaquin for 4 more doses.   1-Acute respiratory failure:  Presumed CAP - per cxr at [place], no obvious infiltrate now  Supplemental O2 as needed  Received vancomycin  and Zosyn for 5 days.  Will transition him to Levaquin 3-10.  2-Severe sepsis  Presume to PNA.  Was on Levophed at [place].  BP stable.  Resume home coreg  EKG in am - intermittent irregular HR  Blood culture no growth to date.   3-ESRD; on hemodialysis. Renal following.   4-Anemia of chronic disease  F/u cbc  5-Thrombocytopenia; chronic. Platelet at 98 in 2011.  Follow trend.  Platelet increasing.  6-Hypothyroidism; continue with synthroid.  7-History of seizure; Continue with Lamictal.  8-History Glennon Hamilton.    Procedures: L IJ CVL ([place]) 3/6>>>  3/6>>>Hypotension at [facility]; transferred to [facility]  3/6 CT head ([place]>>> neg    Consultations:  Nephrologist.   Discharge Exam: Filed Vitals:   04/06/13 0842  BP: 143/49  Pulse: 65  Temp: 97.8 F (36.6 C)  Resp: 17    General: No distress.  Cardiovascular: S 1, S 2 RRR Respiratory: CTA  Discharge Instructions  Discharge Orders   Future Appointments Provider Department Dept Phone   12/14/2013 10:00 AM Dennie Bible, NP [facility] [phone]   Future Orders Complete By Expires   Diet - low sodium heart  healthy  As directed    Increase activity slowly  As directed        Medication List    STOP taking these medications       donepezil 5 MG tablet  Commonly known as:  ARICEPT     hydrocortisone 25 MG suppository  Commonly known as:  ANUSOL-HC     L-Methylfolate-B6-B12 2.8-25-2 MG Tabs     metroNIDAZOLE 0.75 % gel  Commonly known as:  METROGEL     VENOFER 20 MG/ML injection   Generic drug:  iron sucrose      TAKE these medications       allopurinol 100 MG tablet  Commonly known as:  ZYLOPRIM  Take 100 mg by mouth daily.     aspirin 81 MG tablet  Take 81 mg by mouth daily.     bimatoprost 0.01 % Soln  Commonly known as:  LUMIGAN  Place 1 drop into both eyes at bedtime.     calcium carbonate 500 MG chewable tablet  Commonly known as:  TUMS - dosed in mg elemental calcium  Chew 1 tablet by mouth 2 (two) times daily with a meal.     carvedilol 6.25 MG tablet  Commonly known as:  COREG  Take 6.25 mg by mouth at bedtime.     DIALYVITE TABLET Tabs  Take 1 tablet by mouth daily.     folic acid 1 MG tablet  Commonly known as:  FOLVITE  Take 1 mg by mouth daily.     lamoTRIgine 100 MG tablet  Commonly known as:  LAMICTAL  Take 50-100 mg by mouth 2 (two) times daily. Take 1/2 tablet (50mg ) in the morning and take 1 tablet (100mg ) at bedtime     levofloxacin 500 MG tablet  Commonly known as:  LEVAQUIN  Take 1 tablet (500 mg total) by mouth every other day.     levothyroxine 75 MCG tablet  Commonly known as:  SYNTHROID, LEVOTHROID  Take 75 mcg by mouth daily before breakfast.     megestrol 40 MG/ML suspension  Commonly known as:  MEGACE  Take 200 mg by mouth at bedtime.     mirtazapine 15 MG tablet  Commonly known as:  REMERON  Take 15 mg by mouth at bedtime.     omeprazole 20 MG capsule  Commonly known as:  PRILOSEC  Take 20 mg by mouth daily.     saccharomyces boulardii 250 MG capsule  Commonly known as:  FLORASTOR  Take 1 capsule (250 mg total) by mouth 2 (two) times daily.     sertraline 25 MG tablet  Commonly known as:  ZOLOFT  Take 1 tablet (25 mg total) by mouth daily.     simvastatin 20 MG tablet  Commonly known as:  ZOCOR  Take 20 mg by mouth at bedtime.     VITAMIN D-3 PO  Take 2 tablets by mouth every morning.       No Known Allergies    The results of significant diagnostics from this hospitalization (including  imaging, microbiology, ancillary and laboratory) are listed below for reference.    Significant Diagnostic Studies: Dg Chest Port 1 View  04/03/2013   CLINICAL DATA:  Pneumonia.  EXAM: PORTABLE CHEST - 1 VIEW  COMPARISON:  04/02/2013  FINDINGS: There is a left IJ catheter in stable position.  Stable cardiomegaly. Better aerated lungs. No definitive pneumonia. No edema or effusion. Skin edges bilaterally. No visible pneumothorax.  IMPRESSION: Improved lung aeration.  No focal  infiltrate.   Electronically Signed   By: Jorje Guild M.D.   On: 04/03/2013 05:14    Microbiology: Recent Results (from the past 240 hour(s))  MRSA PCR SCREENING     Status: None   Collection Time    04/02/13  9:33 PM      Result Value Ref Range Status   MRSA by PCR NEGATIVE  NEGATIVE Final   Comment:            The GeneXpert MRSA Assay (FDA     approved for NASAL specimens     only), is one component of a     comprehensive MRSA colonization     surveillance program. It is not     intended to diagnose MRSA     infection nor to guide or     monitor treatment for     MRSA infections.  CULTURE, BLOOD (ROUTINE X 2)     Status: None   Collection Time    04/02/13 11:35 PM      Result Value Ref Range Status   Specimen Description BLOOD RIGHT ARM   Final   Special Requests BOTTLES DRAWN AEROBIC AND ANAEROBIC 10CC   Final   Culture  Setup Time     Final   Value: 04/03/2013 03:32     Performed at [facility]   Culture     Final   Value:        BLOOD CULTURE RECEIVED NO GROWTH TO DATE CULTURE WILL BE HELD FOR 5 DAYS BEFORE ISSUING A FINAL NEGATIVE REPORT     Performed at [facility]   Report Status PENDING   Incomplete  CULTURE, BLOOD (ROUTINE X 2)     Status: None   Collection Time    04/02/13 11:44 PM      Result Value Ref Range Status   Specimen Description BLOOD RIGHT HAND   Final   Special Requests BOTTLES DRAWN AEROBIC ONLY 5CC   Final   Culture  Setup Time     Final   Value: 04/03/2013  03:32     Performed at [facility]   Culture     Final   Value:        BLOOD CULTURE RECEIVED NO GROWTH TO DATE CULTURE WILL BE HELD FOR 5 DAYS BEFORE ISSUING A FINAL NEGATIVE REPORT     Performed at [facility]   Report Status PENDING   Incomplete     Labs: Basic Metabolic Panel:  Recent Labs Lab 04/02/13 2330 04/04/13 0436 04/05/13 1300  NA 144 138 142  K 4.5 4.0 4.5  CL 100 97 100  CO2 28 26 23   GLUCOSE 102* 87 107*  BUN 30* 45* 57*  CREATININE 5.81* 7.73* 9.61*  CALCIUM 8.5 7.9* 8.3*  MG 2.0  --   --   PHOS 4.5  --  6.2*   Liver Function Tests:  Recent Labs Lab 04/02/13 2330 04/05/13 1300  AST 50*  --   ALT 17  --   ALKPHOS 100  --   BILITOT 0.3  --   PROT 6.3  --   ALBUMIN 3.1* 2.7*   No results found for this basename: LIPASE, AMYLASE,  in the last 168 hours No results found for this basename: AMMONIA,  in the last 168 hours CBC:  Recent Labs Lab 04/02/13 2330 04/04/13 0436 04/05/13 1300  WBC 6.6 5.6 4.0  NEUTROABS 4.4  --   --   HGB 9.6* 8.1*  9.3*  HCT 29.0* 23.9* 27.2*  MCV 102.5* 102.1* 98.9  PLT 94* 81* 91*   Cardiac Enzymes:  Recent Labs Lab 04/02/13 2306 04/03/13 0445 04/03/13 1053  TROPONINI <0.30 <0.30 <0.30   BNP: BNP (last 3 results) No results found for this basename: PROBNP,  in the last 8760 hours CBG:  Recent Labs Lab 04/02/13 2133 04/03/13 0742 04/03/13 0906 04/03/13 1133 04/03/13 1629  GLUCAP 109* 64* 108* 113* 167*       Signed:  Gwenyth Bouillon A Rayshawn Maney  [facility] 04/06/2013, 11:22 AM

## 2013-04-06 NOTE — Progress Notes (Signed)
Patient was discharged to [facility] by ambulance. Patient was discharged with belongings. Report was called prior to discharge. Patient was stable upon discharge and wife knew of plans.

## 2013-04-09 LAB — CULTURE, BLOOD (ROUTINE X 2)
Culture: NO GROWTH
Culture: NO GROWTH

## 2013-10-14 ENCOUNTER — Telehealth: Payer: Self-pay | Admitting: *Deleted

## 2013-10-14 NOTE — Telephone Encounter (Signed)
Note fax on 10/14/13 to Dr. Marjean Donna.

## 2013-12-14 ENCOUNTER — Ambulatory Visit (INDEPENDENT_AMBULATORY_CARE_PROVIDER_SITE_OTHER): Payer: BC Managed Care – PPO | Admitting: Adult Health

## 2013-12-14 ENCOUNTER — Encounter: Payer: Self-pay | Admitting: Adult Health

## 2013-12-14 VITALS — BP 129/68 | HR 81 | Temp 97.5°F | Resp 16 | Ht 69.0 in | Wt 146.6 lb

## 2013-12-14 DIAGNOSIS — R413 Other amnesia: Secondary | ICD-10-CM

## 2013-12-14 DIAGNOSIS — R569 Unspecified convulsions: Secondary | ICD-10-CM

## 2013-12-14 DIAGNOSIS — G63 Polyneuropathy in diseases classified elsewhere: Secondary | ICD-10-CM

## 2013-12-14 DIAGNOSIS — Z5181 Encounter for therapeutic drug level monitoring: Secondary | ICD-10-CM

## 2013-12-14 MED ORDER — LAMOTRIGINE 100 MG PO TABS: ORAL_TABLET | ORAL | Status: DC

## 2013-12-14 MED ORDER — MEMANTINE HCL 28 X 5 MG & 21 X 10 MG PO TABS: ORAL_TABLET | ORAL | Status: DC

## 2013-12-14 MED ORDER — SERTRALINE HCL 25 MG PO TABS: 25.0000 mg | ORAL_TABLET | Freq: Every day | ORAL | Status: DC

## 2013-12-14 NOTE — Patient Instructions (Signed)
Memantine Tablets  What is this medicine?  MEMANTINE (MEM an teen) is used to treat dementia caused by Alzheimer's disease.  This medicine may be used for other purposes; ask your health care provider or pharmacist if you have questions.  COMMON BRAND NAME(S): Namenda  What should I tell my health care provider before I take this medicine?  They need to know if you have any of these conditions:  -difficulty passing urine  -kidney disease  -liver disease  -seizures  -an unusual or allergic reaction to memantine, other medicines, foods, dyes, or preservatives  -pregnant or trying to get pregnant  -breast-feeding  How should I use this medicine?  Take this medicine by mouth with a glass of water. Follow the directions on the prescription label. You may take this medicine with or without food. Take your doses at regular intervals. Do not take your medicine more often than directed. Continue to take your medicine even if you feel better. Do not stop taking except on the advice of your doctor or health care professional.  Talk to your pediatrician regarding the use of this medicine in children. Special care may be needed.  Overdosage: If you think you have taken too much of this medicine contact a poison control center or emergency room at once.  NOTE: This medicine is only for you. Do not share this medicine with others.  What if I miss a dose?  If you miss a dose, take it as soon as you can. If it is almost time for your next dose, take only that dose. Do not take double or extra doses. If you do not take your medicine for several days, contact your health care provider. Your dose may need to be changed.  What may interact with this medicine?  -acetazolamide  -amantadine  -cimetidine  -dextromethorphan  -dofetilide  -hydrochlorothiazide  -ketamine  -metformin  -methazolamide  -quinidine  -ranitidine  -sodium bicarbonate  -triamterene  This list may not describe all possible interactions. Give your health care provider a  list of all the medicines, herbs, non-prescription drugs, or dietary supplements you use. Also tell them if you smoke, drink alcohol, or use illegal drugs. Some items may interact with your medicine.  What should I watch for while using this medicine?  Visit your doctor or health care professional for regular checks on your progress. Check with your doctor or health care professional if there is no improvement in your symptoms or if they get worse.  You may get drowsy or dizzy. Do not drive, use machinery, or do anything that needs mental alertness until you know how this drug affects you. Do not stand or sit up quickly, especially if you are an older patient. This reduces the risk of dizzy or fainting spells. Alcohol can make you more drowsy and dizzy. Avoid alcoholic drinks.  What side effects may I notice from receiving this medicine?  Side effects that you should report to your doctor or health care professional as soon as possible:  -allergic reactions like skin rash, itching or hives, swelling of the face, lips, or tongue  -agitation or a feeling of restlessness  -depressed mood  -dizziness  -hallucinations  -redness, blistering, peeling or loosening of the skin, including inside the mouth  -seizures  -vomiting  Side effects that usually do not require medical attention (report to your doctor or health care professional if they continue or are bothersome):  -constipation  -diarrhea  -headache  -nausea  -trouble sleeping  This   list may not describe all possible side effects. Call your doctor for medical advice about side effects. You may report side effects to FDA at 1-800-FDA-1088.  Where should I keep my medicine?  Keep out of the reach of children.  Store at room temperature between 15 degrees and 30 degrees C (59 degrees and 86 degrees F). Throw away any unused medicine after the expiration date.  NOTE: This sheet is a summary. It may not cover all possible information. If you have questions about this  medicine, talk to your doctor, pharmacist, or health care provider.  © 2015, Elsevier/Gold Standard. (2012-11-02 14:10:42)

## 2013-12-14 NOTE — Progress Notes (Signed)
PATIENT: [name] DOB: 07-02-1926  REASON FOR VISIT: follow up HISTORY FROM: patient  HISTORY OF PRESENT ILLNESS: [name] is a 80 year old male with a history of seizures, peripheral neuropathy and memory loss. He is currently taking lamicatal and Aricept. He denies any recent seizure events. 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 He lives at home with his wife. He reports that he has numbness in the bottom of his feet, occasionally he will having burning and tingling.  Patient feels that his memory has stayed about the same. He reports that he has the most trouble with names.  He is able to complete ADLs independently. Wife is now doing the finances, she states that she took this over several years ago because she was able to do it faster than the patient was.  The patient was prescribed Aricept but was unable to tolerate the medication, his wife states that he was very "fuzzy headed" on the medication.  HISTORY 12/08/12 [name]): 80 year old right-handed white married male, accompanied by his wife, previous patient of Dr. Erling Cruz, last visit was in Jan 2014. He had a history of peripheral neuropathy due to Prograf, history of Guillain-Barre and end stage renal disease who undergoes dialysis in [place] three days weekly.  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  He was admitted to [facility] in April 2011 for shortness of breath, pulmonary edema, found blood cultures to be postive and treated with Vancomycin.  Questionable seizure event while in hospital.  Dilantin levels were normal. Pt feels the burning in his feet is better since tapering and discontinuing Dilantin.He complains of pain in his knees, but has seen an orthopedist and surgery is not an option with his other medical conditions. He denies any falls. Does not use assistive device. Last seizure activity was April 2011. Denies side effects or tolerability with Lamictal.  Currently on 150mg  of  Lamictal daily.  He also has mild memory trouble, is taking aricept 5 mg qday  REVIEW OF SYSTEMS: Full 14 system review of systems performed and notable only for:  Constitutional: N/A  Eyes: N/A Ear/Nose/Throat: N/A  Skin: N/A  Cardiovascular: N/A  Respiratory: N/A  Gastrointestinal: N/A  Genitourinary: N/A Hematology/Lymphatic: N/A  Endocrine: N/A Musculoskeletal:N/A  Allergy/Immunology: N/A  Neurological: N/A Psychiatric: N/A Sleep: N/A   ALLERGIES: No Known Allergies  HOME MEDICATIONS: Outpatient Prescriptions Prior to Visit  Medication Sig Dispense Refill  . allopurinol (ZYLOPRIM) 100 MG tablet Take 100 mg by mouth daily.    Marland Kitchen aspirin 81 MG tablet Take 81 mg by mouth daily.      . B Complex-C-Folic Acid (DIALYVITE TABLET) TABS Take 1 tablet by mouth daily.     . bimatoprost (LUMIGAN) 0.01 % SOLN Place 1 drop into both eyes at bedtime.    . calcium carbonate (TUMS - DOSED IN MG ELEMENTAL CALCIUM) 500 MG chewable tablet Chew 1 tablet by mouth 2 (two) times daily with a meal.     . carvedilol (COREG) 6.25 MG tablet Take 6.25 mg by mouth at bedtime.     . Cholecalciferol (VITAMIN D-3 PO) Take 2 tablets by mouth every morning.     . folic acid (FOLVITE) 1 MG tablet Take 1 mg by mouth daily.      Marland Kitchen lamoTRIgine (LAMICTAL) 100 MG tablet Take 50-100 mg by mouth 2 (two) times daily. Take 1/2 tablet (50mg ) in the morning and take 1 tablet (100mg )  at bedtime    . levothyroxine (SYNTHROID, LEVOTHROID) 75 MCG tablet Take 75 mcg by mouth daily before breakfast.    . omeprazole (PRILOSEC) 20 MG capsule Take 20 mg by mouth daily.    . sertraline (ZOLOFT) 25 MG tablet Take 1 tablet (25 mg total) by mouth daily. 100 tablet 3  . simvastatin (ZOCOR) 20 MG tablet Take 20 mg by mouth at bedtime.      Marland Kitchen levofloxacin (LEVAQUIN) 500 MG tablet Take 1 tablet (500 mg total) by mouth every other day. 4 tablet 0  . megestrol (MEGACE) 40 MG/ML suspension Take 200 mg by mouth at bedtime.     .  mirtazapine (REMERON) 15 MG tablet Take 15 mg by mouth at bedtime.     . saccharomyces boulardii (FLORASTOR) 250 MG capsule Take 1 capsule (250 mg total) by mouth 2 (two) times daily. 30 capsule 0   No facility-administered medications prior to visit.    PAST MEDICAL HISTORY: Past Medical History  Diagnosis Date  . Colon polyp     adenomatous  . Renal failure   . Hyperlipidemia   . Gout   . Benign prostatic hypertrophy   . Aortic stenosis   . History of Clostridium difficile infection   . Hyperparathyroidism   . Anemia     of chronic disease  . Hypertension   . Hypothyroidism   . Hernia of unspecified site of abdominal cavity without mention of obstruction or gangrene     hiatal  . Diverticulosis   . Proctitis   . Internal hemorrhoids   . Seizures   . Colon cancer   . Dementia   . Depression   . Encounter for long-term (current) use of other medications   . Unspecified hereditary and idiopathic peripheral neuropathy   . Unspecified transient cerebral ischemia   . Acute infective polyneuritis   . Polyneuropathy in other diseases classified elsewhere   . End stage renal disease     PAST SURGICAL HISTORY: Past Surgical History  Procedure Laterality Date  . Kidney transplant      1998  . Skin cancer excision      FAMILY HISTORY: Family History  Problem Relation Age of Onset  . Diabetes Brother   . Colon cancer Neg Hx   . Liver disease Son   . Stroke Father     SOCIAL HISTORY: History   Social History  . Marital Status: Married    Spouse Name: [place]    Number of Children: 3  . Years of Education: 12   Occupational History  .      Retired   Social History Main Topics  . Smoking status: Former Smoker    Quit date: 10/29/1960  . Smokeless tobacco: Never Used  . Alcohol Use: No  . Drug Use: No  . Sexual Activity: Not on file   Other Topics Concern  . Not on file   Social History Narrative   Patient is married and to [place].    Caffeine  three times a week.   Right handed.   Education- 12 th grade.      PHYSICAL EXAM  Filed Vitals:   12/14/13 0926  BP: 129/68  Pulse: 81  Temp: 97.5 F (36.4 C)  TempSrc: Oral  Resp: 16  Height: 5\' 9"  (1.753 m)  Weight: 146 lb 9.6 oz (66.497 kg)   Body mass index is 21.64 kg/(m^2).  Generalized: Well developed, in no acute distress   Neurological examination  Mentation: Alert oriented  to time, place, history taking. Follows all commands speech and language fluent. MMSE 24/30 Cranial nerve II-XII: Pupils were equal round reactive to light. Extraocular movements were full, visual field were full on confrontational test. Facial sensation and strength were normal.  Uvula tongue midline. Head turning and shoulder shrug  were normal and symmetric. Motor: The motor testing reveals 5 over 5 strength of all 4 extremities. Good symmetric motor tone is noted throughout.  Sensory: Sensory testing is intact to soft touch on all 4 extremities. No evidence of extinction is noted.  Coordination: Cerebellar testing reveals good finger-nose-finger and heel-to-shin bilaterally.  Gait and station: patient's gait is wide-based and unsteady. He uses a cane to ambulate. Tandem gait not attempted. Romberg is positive.  Reflexes: Deep tendon reflexes are symmetric and normal bilaterally.     DIAGNOSTIC DATA (LABS, IMAGING, TESTING) - I reviewed patient records, labs, notes, testing and imaging myself where available.  Lab Results  Component Value Date   WBC 4.0 04/05/2013   HGB 9.3* 04/05/2013   HCT 27.2* 04/05/2013   MCV 98.9 04/05/2013   PLT 91* 04/05/2013      Component Value Date/Time   NA 142 04/05/2013 1300   K 4.5 04/05/2013 1300   CL 100 04/05/2013 1300   CO2 23 04/05/2013 1300   GLUCOSE 107* 04/05/2013 1300   BUN 57* 04/05/2013 1300   CREATININE 9.61* 04/05/2013 1300   CALCIUM 8.3* 04/05/2013 1300   PROT 6.3 04/02/2013 2330   ALBUMIN 2.7* 04/05/2013 1300   AST 50* 04/02/2013  2330   ALT 17 04/02/2013 2330   ALKPHOS 100 04/02/2013 2330   BILITOT 0.3 04/02/2013 2330   GFRNONAA 4* 04/05/2013 1300   GFRAA 5* 04/05/2013 1300    Lab Results  Component Value Date   VITAMINB12 >2000* 12/28/2008   Lab Results  Component Value Date   TSH 0.567 04/02/2013      ASSESSMENT AND PLAN 80 y.o. year old male  has a past medical history of Colon polyp; Renal failure; Hyperlipidemia; Gout; Benign prostatic hypertrophy; Aortic stenosis; History of Clostridium difficile infection; Hyperparathyroidism; Anemia; Hypertension; Hypothyroidism; Hernia of unspecified site of abdominal cavity without mention of obstruction or gangrene; Diverticulosis; Proctitis; Internal hemorrhoids; Seizures; Colon cancer; Dementia; Depression; Encounter for long-term (current) use of other medications; Unspecified hereditary and idiopathic peripheral neuropathy; Unspecified transient cerebral ischemia; Acute infective polyneuritis; Polyneuropathy in other diseases classified elsewhere; and End stage renal disease. here with:  1. Seizures 2. Polyneuropathy 3. Memory deficit  The patient's seizures have been controlled on Lamictal. He denies any seizures since the last visit. I will refill the Lamictal today. The patient also takes zoloft- this was prescribed by Dr. Erling Cruz. I will refill this today. The patient's memory has slightly improved. His MMSE today was 24/30 was previously 22/30. The patient has tried Aricept but was unable to tolerate the medication. I will try the patient on Namenda however I have advised the patient to consult with his nephrologist before starting this medication. The patient is on dialysis. The patient and his wife verbalized understanding. I will also check blood work today- CBC, CMP, Lamictal level. If the patient's symptoms worsen or he develops new symptoms he should let us know. Otherwise he will follow-up in 6 months to reassess his memory.      Ward Givens, MSN,  NP-C 12/14/2013, 9:42 AM [facility] [address], [place], [place] [address] [phone]  Note: This document was prepared with digital dictation and possible  smart Company secretary. Any transcriptional errors that result from this process are unintentional.

## 2013-12-15 ENCOUNTER — Encounter: Payer: Self-pay | Admitting: Neurology

## 2013-12-16 LAB — CBC WITH DIFFERENTIAL
Basophils Absolute: 0 10*3/uL (ref 0.0–0.2)
Basos: 1 %
EOS: 4 %
Eosinophils Absolute: 0.2 10*3/uL (ref 0.0–0.4)
HCT: 36.3 % — ABNORMAL LOW (ref 37.5–51.0)
Hemoglobin: 12.2 g/dL — ABNORMAL LOW (ref 12.6–17.7)
IMMATURE GRANS (ABS): 0 10*3/uL (ref 0.0–0.1)
IMMATURE GRANULOCYTES: 0 %
Lymphocytes Absolute: 2 10*3/uL (ref 0.7–3.1)
Lymphs: 42 %
MCH: 33.7 pg — ABNORMAL HIGH (ref 26.6–33.0)
MCHC: 33.6 g/dL (ref 31.5–35.7)
MCV: 100 fL — ABNORMAL HIGH (ref 79–97)
MONOCYTES: 8 %
Monocytes Absolute: 0.4 10*3/uL (ref 0.1–0.9)
NEUTROS PCT: 45 %
Neutrophils Absolute: 2.2 10*3/uL (ref 1.4–7.0)
Platelets: 151 10*3/uL (ref 150–379)
RBC: 3.62 x10E6/uL — ABNORMAL LOW (ref 4.14–5.80)
RDW: 14 % (ref 12.3–15.4)
WBC: 4.8 10*3/uL (ref 3.4–10.8)

## 2013-12-16 LAB — COMPREHENSIVE METABOLIC PANEL
ALK PHOS: 136 IU/L — AB (ref 39–117)
ALT: 9 IU/L (ref 0–44)
AST: 16 IU/L (ref 0–40)
Albumin/Globulin Ratio: 1.4 (ref 1.1–2.5)
Albumin: 4.3 g/dL (ref 3.5–4.7)
BILIRUBIN TOTAL: 0.4 mg/dL (ref 0.0–1.2)
BUN / CREAT RATIO: 5 — AB (ref 10–22)
BUN: 31 mg/dL — ABNORMAL HIGH (ref 8–27)
CHLORIDE: 95 mmol/L — AB (ref 97–108)
CO2: 27 mmol/L (ref 18–29)
Calcium: 9.4 mg/dL (ref 8.6–10.2)
Creatinine, Ser: 6.29 mg/dL — ABNORMAL HIGH (ref 0.76–1.27)
GFR calc non Af Amer: 7 mL/min/{1.73_m2} — ABNORMAL LOW (ref >59)
GFR, EST AFRICAN AMERICAN: 9 mL/min/{1.73_m2} — AB (ref >59)
Globulin, Total: 3.1 g/dL (ref 1.5–4.5)
Glucose: 82 mg/dL (ref 65–99)
Potassium: 4.8 mmol/L (ref 3.5–5.2)
Sodium: 142 mmol/L (ref 134–144)
Total Protein: 7.4 g/dL (ref 6.0–8.5)

## 2013-12-16 LAB — LAMOTRIGINE LEVEL: Lamotrigine Lvl: 3.1 ug/mL (ref 2.0–20.0)

## 2013-12-17 ENCOUNTER — Encounter: Payer: Self-pay | Admitting: *Deleted

## 2013-12-20 ENCOUNTER — Other Ambulatory Visit: Payer: Self-pay | Admitting: Adult Health

## 2013-12-20 ENCOUNTER — Telehealth: Payer: Self-pay | Admitting: Adult Health

## 2013-12-20 NOTE — Telephone Encounter (Signed)
Patient indicates his Kidney doctor advised him he should not take Namenda due to possibility of renal impairment , therefore he has not filled this med.  He wanted to make [facility] aware.

## 2013-12-20 NOTE — Telephone Encounter (Signed)
Patient's wife is calling regarding Rx Memanteen. Patient's kidney doctor states patient cannot take this medication. The Rx was never filled. Please call and advise. Thank you.

## 2013-12-20 NOTE — Telephone Encounter (Signed)
Noted  

## 2013-12-21 ENCOUNTER — Encounter: Payer: Self-pay | Admitting: Neurology

## 2013-12-28 NOTE — Progress Notes (Signed)
I agree above plan. 

## 2014-03-17 NOTE — Telephone Encounter (Signed)
This encounter was created in error - please disregard.

## 2014-05-31 ENCOUNTER — Ambulatory Visit (HOSPITAL_COMMUNITY)
Admission: RE | Admit: 2014-05-31 | Discharge: 2014-05-31 | Disposition: A | Discharge status: Home / Self Care | Payer: Medicaid Other | Source: Ambulatory Visit | Attending: Vascular Surgery | Admitting: Vascular Surgery

## 2014-05-31 ENCOUNTER — Other Ambulatory Visit: Payer: Self-pay

## 2014-05-31 ENCOUNTER — Encounter: Payer: Self-pay | Admitting: Vascular Surgery

## 2014-05-31 ENCOUNTER — Ambulatory Visit (INDEPENDENT_AMBULATORY_CARE_PROVIDER_SITE_OTHER): Payer: Medicaid Other | Admitting: Vascular Surgery

## 2014-05-31 VITALS — BP 144/71 | HR 70 | Ht 69.0 in | Wt 147.2 lb

## 2014-05-31 DIAGNOSIS — X58XXXA Exposure to other specified factors, initial encounter: Secondary | ICD-10-CM | POA: Diagnosis not present

## 2014-05-31 DIAGNOSIS — N189 Chronic kidney disease, unspecified: Secondary | ICD-10-CM | POA: Diagnosis not present

## 2014-05-31 DIAGNOSIS — T82510A Breakdown (mechanical) of surgically created arteriovenous fistula, initial encounter: Secondary | ICD-10-CM

## 2014-05-31 DIAGNOSIS — N186 End stage renal disease: Secondary | ICD-10-CM | POA: Diagnosis not present

## 2014-05-31 LAB — VAS US DUPLEX DIALYSIS ACCESS (AVF, AVG)
ART-VENOUS ANASTAMOSIS PEAK SYS VEL: 586 cm/s
DIALYSIS GRAFT OUTFLOW SEG1 PEAK SYS VEL: 270 cm/s
DIALYSIS GRAFT OUTFLOW SEG2 PEAK SYS VEL: 199 cm/s
DIALYSIS GRAFT OUTFLOW SEG3 PEAK SYS VEL: 280 cm/s
DIALYSIS GRAFT OUTFLOW SEG4 PEAK SYS VEL: 418 cm/s
DIALYSIS OUTFLOW SEG1 DEPTH: 0.23 cm/s
DIALYSIS OUTFLOW SEG1 DIAM: 0.59 cm/s
DIALYSIS OUTFLOW SEG2 DEPTH: 0.69 cm/s
DIALYSIS OUTFLOW SEG2 DIAM: 1.3 cm/s
DIALYSIS OUTFLOW SEG3 DEPTH: 0.3 cm/s
DIALYSIS OUTFLOW SEG3 DIAM: 0.52 cm/s
DIALYSIS OUTFLOW SEG4 DEPTH: 0.18 cm/s
DIALYSIS OUTFLOW SEG4 DIAM: 0.59 cm/s
GRAFT INFLOW ARTERY PEAK SYS VEL: 38 cm/s

## 2014-05-31 NOTE — Progress Notes (Signed)
Subjective:     Patient ID: [name], male   DOB: 06-Jan-1927, 81 y.o.   MRN: [id number]  HPI this 81 year old male was referred by Dr. Posey Pronto for evaluation of left radial cephalic AV fistula which has bled after dialysis on a few occasions. I created this fistula in 1997. He continues to have excellent flows but has dilated significantly. Patient has never had spontaneous bleeding from the fistula. The recent bleeding episodes have been in the "buttonhole" in the proximal forearm. These usually occurred after removing the needle at the end of dialysis but on one occasion it bled around the needle during dialysis. This is never required more than 15-30 minutes to stop with compression according to the patient and his wife. He has never had spontaneous bleeding at home. He had a recent fistulogram performed in [place] which revealed no technical problems  Past Medical History  Diagnosis Date  . Colon polyp     adenomatous  . Renal failure   . Hyperlipidemia   . Gout   . Benign prostatic hypertrophy   . Aortic stenosis   . History of Clostridium difficile infection   . Hyperparathyroidism   . Anemia     of chronic disease  . Hypertension   . Hypothyroidism   . Hernia of unspecified site of abdominal cavity without mention of obstruction or gangrene     hiatal  . Diverticulosis   . Proctitis   . Internal hemorrhoids   . Seizures   . Colon cancer   . Dementia   . Depression   . Encounter for long-term (current) use of other medications   . Unspecified hereditary and idiopathic peripheral neuropathy   . Unspecified transient cerebral ischemia   . Acute infective polyneuritis   . Polyneuropathy in other diseases classified elsewhere   . End stage renal disease   . Atrial fibrillation     History  Substance Use Topics  . Smoking status: Former Smoker    Quit date: 10/29/1960  . Smokeless tobacco: Never Used  . Alcohol Use: No    Family History  Problem Relation Age of  Onset  . Diabetes Brother   . Colon cancer Neg Hx   . Liver disease Son   . Stroke Father     No Known Allergies   Current outpatient prescriptions:  .  allopurinol (ZYLOPRIM) 100 MG tablet, Take 100 mg by mouth daily., Disp: , Rfl:  .  aspirin 81 MG tablet, Take 81 mg by mouth daily.  , Disp: , Rfl:  .  B Complex-C-Folic Acid (DIALYVITE TABLET) TABS, Take 1 tablet by mouth daily. , Disp: , Rfl:  .  bimatoprost (LUMIGAN) 0.01 % SOLN, Place 1 drop into both eyes at bedtime., Disp: , Rfl:  .  calcium carbonate (TUMS - DOSED IN MG ELEMENTAL CALCIUM) 500 MG chewable tablet, Chew 1 tablet by mouth 2 (two) times daily with a meal. , Disp: , Rfl:  .  carvedilol (COREG) 6.25 MG tablet, Take 6.25 mg by mouth at bedtime. , Disp: , Rfl:  .  Cholecalciferol (VITAMIN D-3 PO), Take 2 tablets by mouth every morning. , Disp: , Rfl:  .  folic acid (FOLVITE) 1 MG tablet, Take 1 mg by mouth daily.  , Disp: , Rfl:  .  lamoTRIgine (LAMICTAL) 100 MG tablet, Take 1/2 tablet (50mg ) in the morning and take 1 tablet (100mg ) at bedtime, Disp: 135 tablet, Rfl: 3 .  levothyroxine (SYNTHROID, LEVOTHROID) 75 MCG tablet, Take 75 mcg  by mouth daily before breakfast., Disp: , Rfl:  .  mirtazapine (REMERON) 30 MG tablet, Take 30 mg by mouth at bedtime., Disp: , Rfl: 12 .  omeprazole (PRILOSEC) 20 MG capsule, Take 20 mg by mouth daily., Disp: , Rfl:  .  sertraline (ZOLOFT) 25 MG tablet, Take 1 tablet (25 mg total) by mouth daily., Disp: 30 tablet, Rfl: 3 .  simvastatin (ZOCOR) 20 MG tablet, Take 20 mg by mouth at bedtime.  , Disp: , Rfl:   Filed Vitals:   05/31/14 1012 05/31/14 1014  BP: 139/77 144/71  Pulse: 70   Height: 5\' 9"  (1.753 m)   Weight: 147 lb 3.2 oz (66.769 kg)   SpO2: 100%     Body mass index is 21.73 kg/(m^2).           Review of Systems denies chest pain, dyspnea on exertion. Has unsteady gait. Walks with cane or walker. Has history of Guillain-Barr syndrome with resultant numbness in the  lower extremities.     Objective:   Physical Exam BP 144/71 mmHg  Pulse 70  Ht 5\' 9"  (1.753 m)  Wt 147 lb 3.2 oz (66.769 kg)  BMI 21.73 kg/m2  SpO2 100%  Gen.-alert and oriented x3 in no apparent distress HEENT normal for age Lungs no rhonchi or wheezing Cardiovascular regular rhythm no murmurs carotid pulses 3+ palpable no bruits audible Abdomen soft nontender no palpable masses Musculoskeletal free of  major deformities Skin clear -no rashes  Left upper extremity with radial-cephalic AV fistula which is dilated throughout up to 3 cm in diameter. Excellent pulse and palpable thrill throughout the fistula. It is quite tortuous. The area where the buttonhole is located is in the proximal forearm. No evidence of infection or eschar in this area. No active bleeding noted. Left hand well perfused.  Duplex scan in our office today was ordered by me and reviewed and interpreted. Fistula is widely patent with excellent flow but is diffusely dilated.       Assessment:     History of bleeding from buttonhole and proximal left forearm radial-cephalic AV fistula. No history of spontaneous hemorrhage.    Plan:     Would never stick this area and the buttonhole again for dialysis Go to different location If patient has any spontaneous bleeding from this area will need attempt at revision in OR Patient not interested in surgical repair at this time since he has never had spontaneous bleeding Explained to patient and his wife the need for a new site for puncture

## 2014-06-14 ENCOUNTER — Other Ambulatory Visit: Payer: Self-pay | Admitting: Adult Health

## 2014-06-14 NOTE — Telephone Encounter (Signed)
Prescribed at [facility] in Nov.  Has follow up appt scheduled

## 2014-06-15 ENCOUNTER — Other Ambulatory Visit: Payer: Self-pay | Admitting: Adult Health

## 2014-06-17 ENCOUNTER — Telehealth: Payer: Self-pay | Admitting: Neurology

## 2014-06-17 NOTE — Telephone Encounter (Signed)
This Rx was already sent on 05/18.  I called the pharmacy who verified this Rx has been ready since 05/18.  I called the patient back.  They are aware. They had not called the pharmacy to inquire about meds, but will pick up Rx tomorrow.

## 2014-06-17 NOTE — Telephone Encounter (Signed)
Pt's wife called stating husband needs refill on sertraline (ZOLOFT) 25 MG tablet.  Pt will run out of med on Monday.  Please call pharmacy [facility] [address] [facility] in [place]. Pt can be reached at [phone].

## 2014-06-21 ENCOUNTER — Encounter: Payer: Self-pay | Admitting: Family Medicine

## 2014-07-19 ENCOUNTER — Ambulatory Visit (INDEPENDENT_AMBULATORY_CARE_PROVIDER_SITE_OTHER): Payer: Medicare Other | Admitting: Adult Health

## 2014-07-19 ENCOUNTER — Encounter: Payer: Self-pay | Admitting: Adult Health

## 2014-07-19 VITALS — BP 134/83 | HR 84 | Ht 68.0 in | Wt 143.0 lb

## 2014-07-19 DIAGNOSIS — R413 Other amnesia: Secondary | ICD-10-CM

## 2014-07-19 DIAGNOSIS — R569 Unspecified convulsions: Secondary | ICD-10-CM | POA: Diagnosis not present

## 2014-07-19 DIAGNOSIS — G609 Hereditary and idiopathic neuropathy, unspecified: Secondary | ICD-10-CM | POA: Diagnosis not present

## 2014-07-19 DIAGNOSIS — F4323 Adjustment disorder with mixed anxiety and depressed mood: Secondary | ICD-10-CM

## 2014-07-19 MED ORDER — SERTRALINE HCL 25 MG PO TABS: 25.0000 mg | ORAL_TABLET | Freq: Every day | ORAL | Status: DC

## 2014-07-19 NOTE — Patient Instructions (Addendum)
Continue Lamictal.  I will check blood work today.  I will refill zoloft.

## 2014-07-19 NOTE — Progress Notes (Signed)
PATIENT: [name] DOB: December 02, 1926  REASON FOR VISIT: follow up- seizures, peripheral neuropathy, memory loss HISTORY FROM: patient  HISTORY OF PRESENT ILLNESS: [name] is an 81 year old male with a history of seizures, peripheral neuropathy and memory loss. He returns today for follow-up. He is currently taking Lamictal for seizures and tolerating it well. He denies any seizure events. The patient feels that his memory has remained the same. In the past he has tried Aricept but was unable to tolerate the medication. He was unable to start Namenda due to renal impairment. The patient is able to complete all ADLs independently. He no longer operates a Teacher, music. Patient continues to have numbness in the feet. Denies any pain. The patient has had a recent fall. He states that he likes to work in the woods and fell while out there. Denies any injuries. Patient continues to use a loft for depressed mood. He states that this works well for him. Overall the patient feels that things have remained the same. Denies any new medical history. He returns today for evaluation.  HISTORY 12/14/13: [name] is a 81 year old male with a history of seizures, peripheral neuropathy and memory loss. He is currently taking lamicatal and Aricept. He denies any recent seizure events. 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 He lives at home with his wife. He reports that he has numbness in the bottom of his feet, occasionally he will having burning and tingling. Patient feels that his memory has stayed about the same. He reports that he has the most trouble with names. He is able to complete ADLs independently. Wife is now doing the finances, she states that she took this over several years ago because she was able to do it faster than the patient was. The patient was prescribed Aricept but was unable to tolerate the medication, his wife states that he was very "fuzzy headed" on the medication.  HISTORY 12/08/12  [name]): 81 year old right-handed white married male, accompanied by his wife, previous patient of Dr. Erling Cruz, last visit was in Jan 2014. He hada history of peripheral neuropathy due to Prograf, history of Guillain-Barre and end stage renal disease who undergoes dialysis in [place] three days weekly. 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 He was admitted to [facility] in April 2011 for shortness of breath, pulmonary edema, found blood cultures to be postive and treated with Vancomycin. Questionable seizure event while in hospital. Dilantin levels were normal. Pt feels the burning in his feet is better since tapering and discontinuing Dilantin.He complains of pain in his knees, but has seen an orthopedist and surgery is not an option with his other medical conditions. He denies any falls. Does not use assistive device. Last seizure activity was April 2011. Denies side effects or tolerability with Lamictal. Currently on 150mg  of Lamictal daily. He also has mild memory trouble, is taking aricept 5 mg qday  REVIEW OF SYSTEMS: Out of a complete 14 system review of symptoms, the patient complains only of the following symptoms, and all other reviewed systems are negative.  Loss of vision, walking difficulty, speech difficulty, memory loss, bleeding easily  ALLERGIES: No Known Allergies  HOME MEDICATIONS: Outpatient Prescriptions Prior to Visit  Medication Sig Dispense Refill  . allopurinol (ZYLOPRIM) 100 MG tablet Take 100 mg by mouth daily.    Marland Kitchen aspirin 81 MG tablet Take 81 mg by mouth daily.      Marland Kitchen  B Complex-C-Folic Acid (DIALYVITE TABLET) TABS Take 1 tablet by mouth daily.     . bimatoprost (LUMIGAN) 0.01 % SOLN Place 1 drop into both eyes at bedtime.    . calcium carbonate (TUMS - DOSED IN MG ELEMENTAL CALCIUM) 500 MG chewable tablet Chew 1 tablet by mouth 2 (two) times daily with a meal.     . carvedilol (COREG) 6.25 MG tablet Take 6.25 mg by mouth  at bedtime.     . Cholecalciferol (VITAMIN D-3 PO) Take 2 tablets by mouth every morning.     . folic acid (FOLVITE) 1 MG tablet Take 1 mg by mouth daily.      Marland Kitchen lamoTRIgine (LAMICTAL) 100 MG tablet Take 1/2 tablet (50mg ) in the morning and take 1 tablet (100mg ) at bedtime 135 tablet 3  . levothyroxine (SYNTHROID, LEVOTHROID) 75 MCG tablet Take 75 mcg by mouth daily before breakfast.    . mirtazapine (REMERON) 30 MG tablet Take 30 mg by mouth at bedtime.  12  . omeprazole (PRILOSEC) 20 MG capsule Take 20 mg by mouth daily.    . sertraline (ZOLOFT) 25 MG tablet TAKE 1 TABLET BY MOUTH EVERY DAY 30 tablet 1  . sertraline (ZOLOFT) 25 MG tablet TAKE 1 TABLET BY MOUTH EVERY DAY 30 tablet 1  . simvastatin (ZOCOR) 20 MG tablet Take 20 mg by mouth at bedtime.       No facility-administered medications prior to visit.    PAST MEDICAL HISTORY: Past Medical History  Diagnosis Date  . Colon polyp     adenomatous  . Renal failure   . Hyperlipidemia   . Gout   . Benign prostatic hypertrophy   . Aortic stenosis   . History of Clostridium difficile infection   . Hyperparathyroidism   . Anemia     of chronic disease  . Hypertension   . Hypothyroidism   . Hernia of unspecified site of abdominal cavity without mention of obstruction or gangrene     hiatal  . Diverticulosis   . Proctitis   . Internal hemorrhoids   . Seizures   . Colon cancer   . Dementia   . Depression   . Encounter for long-term (current) use of other medications   . Unspecified hereditary and idiopathic peripheral neuropathy   . Unspecified transient cerebral ischemia   . Acute infective polyneuritis   . Polyneuropathy in other diseases classified elsewhere   . End stage renal disease   . Atrial fibrillation     PAST SURGICAL HISTORY: Past Surgical History  Procedure Laterality Date  . Kidney transplant      1998  . Skin cancer excision      FAMILY HISTORY: Family History  Problem Relation Age of Onset  .  Diabetes Brother   . Colon cancer Neg Hx   . Liver disease Son   . Stroke Father     SOCIAL HISTORY: History   Social History  . Marital Status: Married    Spouse Name: [place]  . Number of Children: 3  . Years of Education: 12   Occupational History  .      Retired   Social History Main Topics  . Smoking status: Former Smoker    Quit date: 10/29/1960  . Smokeless tobacco: Never Used  . Alcohol Use: No  . Drug Use: No  . Sexual Activity: Not on file   Other Topics Concern  . Not on file   Social History Narrative   Patient is married and  to [place].    Caffeine three times a week.   Right handed.   Education- 12 th grade.      PHYSICAL EXAM  Filed Vitals:   07/19/14 1120  BP: 134/83  Pulse: 84  Height: 5\' 8"  (1.727 m)  Weight: 143 lb (64.864 kg)   Body mass index is 21.75 kg/(m^2).  Generalized: Well developed, in no acute distress   Neurological examination  Mentation: Alert oriented to time, place, history taking. Follows all commands speech and language fluent. MMSE 28/30 Cranial nerve II-XII: Pupils were equal round reactive to light. Extraocular movements were full, visual field were full on confrontational test. Facial sensation and strength were normal. Uvula tongue midline. Head turning and shoulder shrug  were normal and symmetric. Motor: The motor testing reveals 5 over 5 strength of all 4 extremities. Good symmetric motor tone is noted throughout.  Sensory: Sensory testing is intact to soft touch on all 4 extremities. No evidence of extinction is noted.  Coordination: Cerebellar testing reveals good finger-nose-finger and heel-to-shin bilaterally.  Gait and station: Gait is wide-based and slightly unsteady. He uses a cane ambulate. Romberg is negative but unsteady.  Reflexes: Deep tendon reflexes are symmetric and normal bilaterally.   DIAGNOSTIC DATA (LABS, IMAGING, TESTING) - I reviewed patient records, labs, notes, testing and imaging  myself where available.      ASSESSMENT AND PLAN 81 y.o. year old male  has a past medical history of Colon polyp; Renal failure; Hyperlipidemia; Gout; Benign prostatic hypertrophy; Aortic stenosis; History of Clostridium difficile infection; Hyperparathyroidism; Anemia; Hypertension; Hypothyroidism; Hernia of unspecified site of abdominal cavity without mention of obstruction or gangrene; Diverticulosis; Proctitis; Internal hemorrhoids; Seizures; Colon cancer; Dementia; Depression; Encounter for long-term (current) use of other medications; Unspecified hereditary and idiopathic peripheral neuropathy; Unspecified transient cerebral ischemia; Acute infective polyneuritis; Polyneuropathy in other diseases classified elsewhere; End stage renal disease; and Atrial fibrillation. here with:  1. Seizures 2. Peripheral neuropathy 3. Memory loss 4. Depressed mood  Overall the patient is doing well. He has not had any seizure event since last visit. He will continue taking Lamictal. I will check a Lamictal level today. He will have basic blood work done at the end of the month at dialysis. Patient's memory has remained stable. At this time we will continue to monitor. Patient has no pain associated with his peripheral neuropathy. I have recommended that the patient uses a cane at all times to avoid falls. The patient verbalized understanding. Patient continues to take Zoloft for depression. I will refill this today. He will follow-up in 6 months or sooner if needed.     Ward Givens, MSN, NP-C 07/19/2014, 11:37 AM [facility] [address], [place], [place] [address] [phone]  Note: This document was prepared with digital dictation and possible smart phrase technology. Any transcriptional errors that result from this process are unintentional.

## 2014-07-20 LAB — LAMOTRIGINE LEVEL: Lamotrigine Lvl: 3.2 ug/mL (ref 2.0–20.0)

## 2014-07-21 ENCOUNTER — Telehealth: Payer: Self-pay

## 2014-07-21 NOTE — Telephone Encounter (Signed)
-----   Message from Ward Givens, NP sent at 07/21/2014  7:39 AM EDT ----- Lab work normal. Please call patient.

## 2014-07-21 NOTE — Telephone Encounter (Signed)
Called and spoke to patient's wife and relayed blood work was normal.

## 2014-07-26 NOTE — Progress Notes (Signed)
I have reviewed and agreed above plan. 

## 2014-10-04 ENCOUNTER — Other Ambulatory Visit: Payer: Self-pay | Admitting: Adult Health

## 2014-11-24 NOTE — Telephone Encounter (Signed)
Error

## 2014-12-22 ENCOUNTER — Other Ambulatory Visit: Payer: Self-pay | Admitting: Adult Health

## 2015-01-17 ENCOUNTER — Ambulatory Visit (INDEPENDENT_AMBULATORY_CARE_PROVIDER_SITE_OTHER): Payer: Medicare Other | Admitting: Internal Medicine

## 2015-01-17 ENCOUNTER — Encounter: Payer: Self-pay | Admitting: Internal Medicine

## 2015-01-17 ENCOUNTER — Ambulatory Visit (INDEPENDENT_AMBULATORY_CARE_PROVIDER_SITE_OTHER)
Admission: RE | Admit: 2015-01-17 | Discharge: 2015-01-17 | Disposition: A | Discharge status: Home / Self Care | Payer: Medicare Other | Source: Ambulatory Visit | Attending: Internal Medicine | Admitting: Internal Medicine

## 2015-01-17 VITALS — BP 106/62 | HR 97 | Ht 69.0 in | Wt 146.0 lb

## 2015-01-17 DIAGNOSIS — J948 Other specified pleural conditions: Secondary | ICD-10-CM

## 2015-01-17 DIAGNOSIS — J90 Pleural effusion, not elsewhere classified: Secondary | ICD-10-CM

## 2015-01-17 DIAGNOSIS — K089 Disorder of teeth and supporting structures, unspecified: Secondary | ICD-10-CM

## 2015-01-17 DIAGNOSIS — R0600 Dyspnea, unspecified: Secondary | ICD-10-CM | POA: Diagnosis not present

## 2015-01-17 DIAGNOSIS — I359 Nonrheumatic aortic valve disorder, unspecified: Secondary | ICD-10-CM

## 2015-01-17 NOTE — Progress Notes (Signed)
Quick Note:  Spoke with pt and notified of results per Dr. Wert. Pt verbalized understanding and denied any questions.  ______ 

## 2015-01-17 NOTE — Patient Instructions (Signed)
Please remember to go to the x-ray department downstairs for your tests - we will call you with the results when they are available.  Call your dentist right away as the top molar on R side looks very bad and can make your lung sick.

## 2015-01-17 NOTE — Progress Notes (Signed)
Subjective:    Patient ID: [name], male    DOB: 1926/11/03,   MRN: [id number]  HPI  70 yowm quit smoking 1962 with onset sob x 2006 gradually worse esp since early December 2016 > eval by Dr Lin Landsman > pleural fluid on L removed  12/13/14 >some better sob but  for < 24 h and referred to pulmonary clinic  01/17/2015 by Dr Lin Landsman   01/17/2015 1st [facility] office visit/ [name]   Chief Complaint  Patient presents with  . Pulmonary Consult    Referred by Dr. Lin Landsman. Pt c/o DOE x 3 wks. He states that he gets out of breath walking "one step".  He also c/o minimal  non prod cough for the past few wks.   since breathing worse x 3 weeks more cough/ assoc with dysphagia also  Says has teeth checked every 6 m but no recent dental eval/cleaning   No obvious   day to day or daytime variabilty or assoc excess/ purulent sputum or mucus plugs   or cp or chest tightness, subjective wheeze overt sinus or hb symptoms. No unusual exp hx or h/o childhood pna/ asthma or knowledge of premature birth.  Sleeping ok without nocturnal  or early am exacerbation  of respiratory  c/o's or need for noct saba. Also denies any obvious fluctuation of symptoms with weather or environmental changes or other aggravating or alleviating factors except as outlined above   Current Medications, Allergies, Complete Past Medical History, Past Surgical History, Family History, and Social History were reviewed in [employer] record.                Review of Systems  Constitutional: Positive for appetite change. Negative for fever, chills, activity change and unexpected weight change.  HENT: Positive for trouble swallowing. Negative for congestion, dental problem, postnasal drip, rhinorrhea, sneezing, sore throat and voice change.   Eyes: Negative for visual disturbance.  Respiratory: Positive for cough and shortness of breath. Negative for choking.   Cardiovascular: Negative for chest  pain and leg swelling.  Gastrointestinal: Negative for nausea, vomiting and abdominal pain.  Genitourinary: Negative for difficulty urinating.  Musculoskeletal: Positive for arthralgias.  Skin: Negative for rash.  Psychiatric/Behavioral: Negative for behavioral problems and confusion.       Objective:   Physical Exam  Hoarse amb wm nad  Wt Readings from Last 3 Encounters:  01/17/15 146 lb (66.225 kg)  07/19/14 143 lb (64.864 kg)  05/31/14 147 lb 3.2 oz (66.769 kg)    Vital signs reviewed   HEENT: nl turbinates, and oropharynx dentition poor with definite "open oyster" appearing molar R upper  . Nl external ear canals without cough reflex   NECK :   Pos JVD to jaw sitting position   / no palp Nodes/TM/ nl carotid upstrokes bilaterally   LUNGS: no acc muscle use,  Nl contour chest with decreased bs with dullness L base/ no egophony    CV:  IRIR    III/VI SEM with reduced S2 but no s3 or  increase in P2, no lower ext edema   ABD:  soft and nontender with nl inspiratory excursion in the supine position. No bruits or organomegaly, bowel sounds nl  MS:  Nl gait/ ext warm without deformities, calf tenderness, cyanosis or clubbing No obvious joint restrictions   SKIN: warm and dry without lesions    NEURO:  alert, approp, nl sensorium with  no motor deficits     CXR  PA and Lateral:   01/17/2015 :    I personally reviewed images and agree with radiology impression as follows:    Moderate left effusion unchanged. Small right effusion unchanged  Improvement in bilateral airspace disease which may represent improving pulmonary edema      Assessment & Plan:

## 2015-01-18 ENCOUNTER — Encounter: Payer: Self-pay | Admitting: Internal Medicine

## 2015-01-18 DIAGNOSIS — K089 Disorder of teeth and supporting structures, unspecified: Secondary | ICD-10-CM | POA: Insufficient documentation

## 2015-01-18 NOTE — Assessment & Plan Note (Addendum)
S/p Tap 01/12/15  X 400 cc bloody/  PH 7.6   LDH  779  - 01/17/2015   Walked RA x one lap @ 185 stopped due to pain in legs/ feet and some   Sob but no desat slow pace   Cytology is pending but this is clearly a bloody exudative pleural effusion with a differential diagnoses being malignancy versus PE (which doesn't fit hx) vs  trauma for which there has been no history and note the patient is only on a baby aspirin nothing stronger.   He has severe dental dz which is a risk for empyema but the fluid analysis does not support this dx   I discussed the differential diagnosis with the patient and his family and possible management strategies once we have the results of the study re: obtained. If he does require hospitalization for this problem and should be a cone where they both do dialysis and place Pleurx catheters or perform VATS procedures, the latter would be of course a last resort in this patient with poor functional status.  I had an extended discussion with the patient reviewing all relevant studies completed to date and  lasting 35  minutes of a 60  minute visit    Each maintenance medication was reviewed in detail including most importantly the difference between maintenance and prns and under what circumstances the prns are to be triggered using an action plan format that is not reflected in the computer generated alphabetically organized AVS.    Please see instructions for details which were reviewed in writing and the patient given a copy highlighting the part that I personally wrote and discussed at today's ov.

## 2015-01-18 NOTE — Assessment & Plan Note (Signed)
Echo 03/02/09  AS with mean  gradient 15 and moderate MR   He clearly has JVD in the sitting position but no peripheral edema and therefore is unlikely the setting of  a left much greater than right pleural effusion - especially bloody one -  to be related to heart failure although we need to keep this in the differential diagnosis and also in the planning should he require any form of surgery.Marland Kitchen

## 2015-01-18 NOTE — Assessment & Plan Note (Signed)
He has at least one severely decayed molar on the right upper and needs dental attention immediately not only for the risk of aspirating but also proceeding the bloodstream given the bowel or heart disease that is present

## 2015-01-19 ENCOUNTER — Ambulatory Visit: Payer: Medicare Other | Admitting: Adult Health

## 2015-01-24 ENCOUNTER — Ambulatory Visit: Payer: Medicare Other | Admitting: Adult Health

## 2015-01-29 ENCOUNTER — Other Ambulatory Visit: Payer: Self-pay | Admitting: Adult Health

## 2015-02-02 ENCOUNTER — Encounter: Payer: Self-pay | Admitting: Adult Health

## 2015-02-02 ENCOUNTER — Ambulatory Visit (INDEPENDENT_AMBULATORY_CARE_PROVIDER_SITE_OTHER): Payer: Medicare HMO | Admitting: Adult Health

## 2015-02-02 VITALS — BP 132/75 | HR 98 | Ht 69.0 in | Wt 144.5 lb

## 2015-02-02 DIAGNOSIS — R413 Other amnesia: Secondary | ICD-10-CM

## 2015-02-02 DIAGNOSIS — R569 Unspecified convulsions: Secondary | ICD-10-CM | POA: Diagnosis not present

## 2015-02-02 DIAGNOSIS — G609 Hereditary and idiopathic neuropathy, unspecified: Secondary | ICD-10-CM | POA: Diagnosis not present

## 2015-02-02 NOTE — Patient Instructions (Signed)
Continue lamictal for seizures Memory score slightly declined. Let me know if you would like to consider [employer] If your symptoms worsen or you develop new symptoms please let us know.

## 2015-02-02 NOTE — Progress Notes (Signed)
I have reviewed and agreed above plan. 

## 2015-02-02 NOTE — Progress Notes (Signed)
PATIENT: [name] DOB: [birth year]  REASON FOR VISIT: follow up- seizures, peripheral neuropathy, memory loss HISTORY FROM: patient  HISTORY OF PRESENT ILLNESS: [name] is an 82 year old male with a history of seizures, peripheral neuropathy and memory loss. He returns today for follow-up. Patient reports that he has not had any seizure events. He continues to take Lamictal and tolerates it well. Patient reports that he does not have any discomfort with his peripheral neuropathy. He reports that he just has numbness in the lower extremities. Patient uses a cane when ambulating. He does have a walker and uses it in his home. The patient does report that he's had several falls but no significant injuries. Patient does feel that his memory has gotten slightly worse. In the past he has been unable to tolerate Aricept and could not initiate Namenda due to renal impairment. He reports that he is able to complete all ADLs independently. He no longer operates a Teacher, music. His wife completes the finances now. Wife reports that he does fairly well at home. She has not noticed any significant changes. Patient does report shortness of breath with ambulation. He states that he's been following with a pulmonologist so far has not received any answers for his shortness of breath. He denies any new neurological symptoms. He returns today for an evaluation.  HISTORY 07/19/14:[name] is an 82 year old male with a history of seizures, peripheral neuropathy and memory loss. He returns today for follow-up. He is currently taking Lamictal for seizures and tolerating it well. He denies any seizure events. The patient feels that his memory has remained the same. In the past he has tried Aricept but was unable to tolerate the medication. He was unable to start Namenda due to renal impairment. The patient is able to complete all ADLs independently. He no longer operates a Teacher, music. Patient continues to have  numbness in the feet. Denies any pain. The patient has had a recent fall. He states that he likes to work in the woods and fell while out there. Denies any injuries. Patient continues to use a loft for depressed mood. He states that this works well for him. Overall the patient feels that things have remained the same. Denies any new medical history. He returns today for evaluation.   REVIEW OF SYSTEMS: Out of a complete 14 system review of symptoms, the patient complains only of the following symptoms, and all other reviewed systems are negative.  Walking difficulty, decreased concentration, weakness, anemia, loss of vision, fatigue, appetite change, trouble swallowing  ALLERGIES: No Known Allergies  HOME MEDICATIONS: Outpatient Prescriptions Prior to Visit  Medication Sig Dispense Refill  . allopurinol (ZYLOPRIM) 100 MG tablet Take 100 mg by mouth daily.    Marland Kitchen aspirin 81 MG tablet Take 81 mg by mouth daily.      . B Complex-C-Folic Acid (DIALYVITE TABLET) TABS Take 1 tablet by mouth daily.     . calcium carbonate (TUMS - DOSED IN MG ELEMENTAL CALCIUM) 500 MG chewable tablet Chew 1 tablet by mouth 2 (two) times daily with a meal.     . carvedilol (COREG) 25 MG tablet Take 12.5 mg by mouth at bedtime.    . Cholecalciferol (VITAMIN D-3 PO) Take 2 tablets by mouth every morning.     . folic acid (FOLVITE) 1 MG tablet Take 1 mg by mouth daily.      Marland Kitchen lamoTRIgine (LAMICTAL) 100 MG tablet TAKE 1/2 TABLET BY MOUTH EVERY MORNING AND 1  TABLET AT BEDTIME 135 tablet 0  . latanoprost (XALATAN) 0.005 % ophthalmic solution     . levothyroxine (SYNTHROID, LEVOTHROID) 75 MCG tablet Take 75 mcg by mouth daily before breakfast.    . mirtazapine (REMERON) 30 MG tablet Take 30 mg by mouth at bedtime.  12  . omeprazole (PRILOSEC) 20 MG capsule Take 20 mg by mouth daily.    . sertraline (ZOLOFT) 25 MG tablet TAKE 1 TABLET BY MOUTH EVERY DAY 30 tablet 0  . simvastatin (ZOCOR) 20 MG tablet Take 20 mg by mouth at  bedtime.      . bimatoprost (LUMIGAN) 0.01 % SOLN Place 1 drop into both eyes at bedtime.     No facility-administered medications prior to visit.    PAST MEDICAL HISTORY: Past Medical History  Diagnosis Date  . Colon polyp     adenomatous  . Renal failure   . Hyperlipidemia   . Gout   . Benign prostatic hypertrophy   . Aortic stenosis   . History of Clostridium difficile infection   . Hyperparathyroidism   . Anemia     of chronic disease  . Hypertension   . Hypothyroidism   . Hernia of unspecified site of abdominal cavity without mention of obstruction or gangrene     hiatal  . Diverticulosis   . Proctitis   . Internal hemorrhoids   . Seizures ([place])   . Colon cancer ([place])   . Dementia   . Depression   . Encounter for long-term (current) use of other medications   . Unspecified hereditary and idiopathic peripheral neuropathy   . Unspecified transient cerebral ischemia   . Acute infective polyneuritis ([place])   . Polyneuropathy in other diseases classified elsewhere ([place])   . End stage renal disease ([place])   . Atrial fibrillation ([place])     PAST SURGICAL HISTORY: Past Surgical History  Procedure Laterality Date  . Kidney transplant      1998  . Skin cancer excision      FAMILY HISTORY: Family History  Problem Relation Age of Onset  . Diabetes Brother   . Colon cancer Neg Hx   . Liver disease Son   . Stroke Father     SOCIAL HISTORY: Social History   Social History  . Marital Status: Married    Spouse Name: [place]  . Number of Children: 3  . Years of Education: 12   Occupational History  . Retired     Retired   Social History Main Topics  . Smoking status: Former Smoker -- 3.00 packs/day for 40 years    Types: Cigarettes    Quit date: 10/29/1960  . Smokeless tobacco: Never Used  . Alcohol Use: No  . Drug Use: No  . Sexual Activity: Not on file   Other Topics Concern  . Not on file   Social History Narrative   Patient is married and to  [place].    Caffeine three times a week.   Right handed.   Education- 12 th grade.      PHYSICAL EXAM  Filed Vitals:   02/02/15 1056  BP: 132/75  Pulse: 98  Height: 5\' 9"  (1.753 m)  Weight: 144 lb 8 oz (65.545 kg)   Body mass index is 21.33 kg/(m^2).  MMSE - Mini Mental State Exam 02/02/2015 07/19/2014  Orientation to time 5 5  Orientation to Place 2 5  Registration 3 3  Attention/ Calculation 0 4  Recall 3 3  Language- name 2 objects 2  2  Language- repeat 0 1  Language- follow 3 step command 3 3  Language- read & follow direction 1 1  Write a sentence 0 0  Copy design 1 1  Total score 20 28     Generalized: Well developed, in no acute distress   Neurological examination  Mentation: Alert. Follows all commands speech and language fluent Cranial nerve II-XII: Pupils were equal round reactive to light. Extraocular movements were full, visual field were full on confrontational test. Facial sensation and strength were normal. Uvula tongue midline. Head turning and shoulder shrug  were normal and symmetric. Motor: The motor testing reveals 5 over 5 strength of all 4 extremities. Good symmetric motor tone is noted throughout.  Sensory: Sensory testing is intact to soft touch on all 4 extremities. No evidence of extinction is noted.  Coordination: Cerebellar testing reveals good finger-nose-finger and heel-to-shin bilaterally.  Gait and station: Patient uses a cane when ambulating. Gait is slightly unsteady. Patient has a wide-based gait. Tandem gait not attempted. Reflexes: Deep tendon reflexes are symmetric and normal bilaterally.   DIAGNOSTIC DATA (LABS, IMAGING, TESTING) - I reviewed patient records, labs, notes, testing and imaging myself where available.  Lab Results  Component Value Date   WBC 4.8 12/14/2013   HGB 12.2* 12/14/2013   HCT 36.3* 12/14/2013   MCV 100* 12/14/2013   PLT 151 12/14/2013      Component Value Date/Time   NA 142 12/14/2013 1020   NA 142  04/05/2013 1300   K 4.8 12/14/2013 1020   CL 95* 12/14/2013 1020   CO2 27 12/14/2013 1020   GLUCOSE 82 12/14/2013 1020   GLUCOSE 107* 04/05/2013 1300   BUN 31* 12/14/2013 1020   BUN 57* 04/05/2013 1300   CREATININE 6.29* 12/14/2013 1020   CALCIUM 9.4 12/14/2013 1020   PROT 7.4 12/14/2013 1020   PROT 6.3 04/02/2013 2330   ALBUMIN 4.3 12/14/2013 1020   ALBUMIN 2.7* 04/05/2013 1300   AST 16 12/14/2013 1020   ALT 9 12/14/2013 1020   ALKPHOS 136* 12/14/2013 1020   BILITOT 0.4 12/14/2013 1020   GFRNONAA 7* 12/14/2013 1020   GFRAA 9* 12/14/2013 1020       ASSESSMENT AND PLAN 82 y.o. year old male  has a past medical history of Colon polyp; Renal failure; Hyperlipidemia; Gout; Benign prostatic hypertrophy; Aortic stenosis; History of Clostridium difficile infection; Hyperparathyroidism; Anemia; Hypertension; Hypothyroidism; Hernia of unspecified site of abdominal cavity without mention of obstruction or gangrene; Diverticulosis; Proctitis; Internal hemorrhoids; Seizures ([place]); Colon cancer ([place]); Dementia; Depression; Encounter for long-term (current) use of other medications; Unspecified hereditary and idiopathic peripheral neuropathy; Unspecified transient cerebral ischemia; Acute infective polyneuritis ([place]); Polyneuropathy in other diseases classified elsewhere [facility]); End stage renal disease ([place]); and Atrial fibrillation ([place]). here with:  1. Seizures 2. Peripheral neuropathy 3. Memory impairment  The patient has remained stable in regards to his seizures. He will continue on Lamictal. The patient had blood work with his dialysis appointment and he will have this faxed over to our office. The patient's peripheral neuropathy has remained stable. He is encouraged to use a walker at all times. The patient's memory score has slightly declined since the last visit. At this time the patient does not want to initiate any new medication. Patient advised that if his symptoms worsen or he develops  any new symptoms she will let us know. He will follow-up in 6 months with Dr. Krista Blue.     Ward Givens, MSN, NP-C 02/02/2015, 11:28 AM  [facility] [address], [place], [place] [address] [phone]

## 2015-02-09 ENCOUNTER — Encounter: Payer: Self-pay | Admitting: Internal Medicine

## 2015-02-09 ENCOUNTER — Ambulatory Visit (INDEPENDENT_AMBULATORY_CARE_PROVIDER_SITE_OTHER): Payer: Medicare HMO | Admitting: Internal Medicine

## 2015-02-09 ENCOUNTER — Ambulatory Visit (INDEPENDENT_AMBULATORY_CARE_PROVIDER_SITE_OTHER)
Admission: RE | Admit: 2015-02-09 | Discharge: 2015-02-09 | Disposition: A | Discharge status: Home / Self Care | Payer: Medicare HMO | Source: Ambulatory Visit | Attending: Internal Medicine | Admitting: Internal Medicine

## 2015-02-09 DIAGNOSIS — J948 Other specified pleural conditions: Secondary | ICD-10-CM

## 2015-02-09 DIAGNOSIS — J90 Pleural effusion, not elsewhere classified: Secondary | ICD-10-CM

## 2015-02-09 NOTE — Progress Notes (Signed)
Subjective:    Patient ID: [name], male    DOB: May 03, 1926,   MRN: [id number]    Brief patient profile:  69 yowm quit smoking 1962 with onset sob x 2006 gradually worse esp since early December 2016 > eval by Dr Lin Landsman > pleural fluid on L removed  12/13/14 >some better sob but  for < 24 h and referred to pulmonary clinic  01/17/2015 by Dr Lin Landsman  History of Present Illness  01/17/2015 1st [place] [facility] [facility] office visit/ Tyrina Hines   Chief Complaint  Patient presents with  . Pulmonary Consult    Referred by Dr. Lin Landsman. Pt c/o DOE x 3 wks. He states that he gets out of breath walking "one step".  He also c/o minimal  non prod cough for the past few wks.   since breathing worse x 3 weeks more cough/ assoc with dysphagia also  Says has teeth checked every 6 m but no recent dental eval/cleaning  rec Please remember to go to the x-ray department downstairs for your tests - we will call you with the results when they are available. Call your dentist right away as the top molar on R side looks very bad and can make your lung sick.     02/09/2015  f/u ov/Ramatoulaye Pack re:  L bloody effusion  ? etiology Chief Complaint  Patient presents with  . Follow-up    CXR done today. Pt states his breathing is unchanged. No new co's today.    lie flat ok no 02  Denies pain or h/o trauma L chest.   No obvious day to day or daytime variability or assoc chronic cough or cp or chest tightness, subjective wheeze or overt sinus or hb symptoms. No unusual exp hx or h/o childhood pna/ asthma or knowledge of premature birth.  Sleeping ok without nocturnal  or early am exacerbation  of respiratory  c/o's or need for noct saba. Also denies any obvious fluctuation of symptoms with weather or environmental changes or other aggravating or alleviating factors except as outlined above   Current Medications, Allergies, Complete Past Medical History, Past Surgical History, Family History, and Social History were reviewed  in [employer] record.  ROS  The following are not active complaints unless bolded sore throat, dysphagia, dental problems, itching, sneezing,  nasal congestion or excess/ purulent secretions, ear ache,   fever, chills, sweats, unintended wt loss, classically pleuritic or exertional cp, hemoptysis,  orthopnea pnd or leg swelling, presyncope, palpitations, abdominal pain, anorexia, nausea, vomiting, diarrhea  or change in bowel or bladder habits, change in stools or urine, dysuria,hematuria,  rash, arthralgias, visual complaints, headache, numbness, weakness or ataxia or problems with walking or coordination,  change in mood/affect or memory.                       Objective:   Physical Exam  Hoarse amb wm nad  02/09/2015        145    01/17/15 146 lb (66.225 kg)  07/19/14 143 lb (64.864 kg)  05/31/14 147 lb 3.2 oz (66.769 kg)    Vital signs reviewed   HEENT: nl turbinates, and oropharynx dentition poor   . Nl external ear canals without cough reflex   NECK :   Pos JVD to jaw sitting position   / no palp Nodes/TM/ nl carotid upstrokes bilaterally   LUNGS: no acc muscle use,  Nl contour chest with decreased bs with dullness L base/ no  egophony    CV:  IRIR    III/VI SEM with reduced S2 but no s3 or  increase in P2,  Trace bilateral lower ext pitting  edema   ABD:  soft and nontender with nl inspiratory excursion in the supine position. No bruits or organomegaly, bowel sounds nl  MS:  Nl gait/ ext warm without deformities, calf tenderness, cyanosis or clubbing No obvious joint restrictions   SKIN: warm and dry without lesions    NEURO:  alert, approp, nl sensorium with  no motor deficits      CXR PA and Lateral:   02/09/2015 :    I personally reviewed images and agree with radiology impression as follows:    Overall mild increase in pulmonary interstitial markings especially in the left upper lobe. Stable loculated appearing pleural effusion on the  left. Stable trace right pleural effusion.      Assessment & Plan:

## 2015-02-09 NOTE — Patient Instructions (Signed)
Please see patient coordinator before you leave today  to schedule u/s guided thoracentesis then followed by CT chest with contrast

## 2015-02-13 NOTE — Assessment & Plan Note (Addendum)
S/p Tap 01/12/15  X 400 cc bloody/  PH 7.6   LDH  779 > cytology neg  - 01/17/2015   Walked RA x one lap @ 185 stopped due to pain in legs/ feet and some   Sob but no desat slow pace - 02/09/2015 rec repeat Tcentesis and then CT chest same day   The ddx for bloody effusion is quite limited and includes trauma/ anticogulation(though he is on HD so does get some hep) / pe and ca so since the first 3 not likely clinically the likelihood of underlying ca increases and best to tap dry then do ct with contrast to see what the lung looks like underneath  Discussed in detail all the  indications, usual  risks and alternatives  relative to the benefits with patient who agrees to proceed with plan as outlined  I had an extended discussion with the patient reviewing all relevant studies completed to date and  lasting 15 to 20 minutes of a 25 minute visit    Each maintenance medication was reviewed in detail including most importantly the difference between maintenance and prns and under what circumstances the prns are to be triggered using an action plan format that is not reflected in the computer generated alphabetically organized AVS.    Please see instructions for details which were reviewed in writing and the patient given a copy highlighting the part that I personally wrote and discussed at today's ov.

## 2015-02-14 ENCOUNTER — Ambulatory Visit (INDEPENDENT_AMBULATORY_CARE_PROVIDER_SITE_OTHER)
Admission: RE | Admit: 2015-02-14 | Discharge: 2015-02-14 | Disposition: A | Discharge status: Home / Self Care | Payer: Medicare HMO | Source: Ambulatory Visit | Attending: Internal Medicine | Admitting: Internal Medicine

## 2015-02-14 ENCOUNTER — Ambulatory Visit (HOSPITAL_COMMUNITY)
Admission: RE | Admit: 2015-02-14 | Discharge: 2015-02-14 | Disposition: A | Discharge status: Home / Self Care | Payer: Medicare HMO | Source: Ambulatory Visit | Attending: Internal Medicine | Admitting: Internal Medicine

## 2015-02-14 ENCOUNTER — Ambulatory Visit (HOSPITAL_COMMUNITY)
Admission: RE | Admit: 2015-02-14 | Discharge: 2015-02-14 | Disposition: A | Discharge status: Home / Self Care | Payer: Medicare HMO | Source: Ambulatory Visit | Attending: Physician Assistant | Admitting: Physician Assistant

## 2015-02-14 VITALS — BP 131/78

## 2015-02-14 DIAGNOSIS — Z85038 Personal history of other malignant neoplasm of large intestine: Secondary | ICD-10-CM | POA: Insufficient documentation

## 2015-02-14 DIAGNOSIS — J90 Pleural effusion, not elsewhere classified: Secondary | ICD-10-CM | POA: Diagnosis not present

## 2015-02-14 DIAGNOSIS — J948 Other specified pleural conditions: Secondary | ICD-10-CM | POA: Diagnosis not present

## 2015-02-14 DIAGNOSIS — R0602 Shortness of breath: Secondary | ICD-10-CM | POA: Insufficient documentation

## 2015-02-14 DIAGNOSIS — R918 Other nonspecific abnormal finding of lung field: Secondary | ICD-10-CM | POA: Insufficient documentation

## 2015-02-14 LAB — BODY FLUID CELL COUNT WITH DIFFERENTIAL
Eos, Fluid: 1 %
Lymphs, Fluid: 27 %
Monocyte-Macrophage-Serous Fluid: 58 % (ref 50–90)
Neutrophil Count, Fluid: 14 % (ref 0–25)
WBC FLUID: 160 uL (ref 0–1000)

## 2015-02-14 LAB — GLUCOSE, SEROUS FLUID: GLUCOSE FL: 106 mg/dL

## 2015-02-14 MED ORDER — IOHEXOL 300 MG/ML  SOLN
80.0000 mL | Freq: Once | INTRAMUSCULAR | Status: AC | PRN
  Administered 2015-02-14: 80 mL via INTRAVENOUS

## 2015-02-14 NOTE — Procedures (Signed)
Successful US guided right thoracentesis. Yielded 1 liter of clear yellow fluid. Pt tolerated procedure well. No immediate complications.  Specimen was sent for labs. CXR ordered.  WENDY S BLAIR PA-C 02/14/2015 12:15 PM

## 2015-02-16 NOTE — Progress Notes (Signed)
Quick Note:  Spoke with pt's spouse and notified of results per Dr. Melvyn Novas. She verbalized understanding and denied any questions. Ov scheduled for 02/21/15 at 1:30 ______

## 2015-02-21 ENCOUNTER — Encounter: Payer: Self-pay | Admitting: Internal Medicine

## 2015-02-21 ENCOUNTER — Ambulatory Visit (INDEPENDENT_AMBULATORY_CARE_PROVIDER_SITE_OTHER)
Admission: RE | Admit: 2015-02-21 | Discharge: 2015-02-21 | Disposition: A | Discharge status: Home / Self Care | Payer: Medicare HMO | Source: Ambulatory Visit | Attending: Internal Medicine | Admitting: Internal Medicine

## 2015-02-21 ENCOUNTER — Other Ambulatory Visit (INDEPENDENT_AMBULATORY_CARE_PROVIDER_SITE_OTHER): Payer: Medicare HMO

## 2015-02-21 ENCOUNTER — Ambulatory Visit (INDEPENDENT_AMBULATORY_CARE_PROVIDER_SITE_OTHER): Payer: Medicare HMO | Admitting: Internal Medicine

## 2015-02-21 VITALS — BP 140/66 | HR 110 | Ht 69.0 in | Wt 145.0 lb

## 2015-02-21 DIAGNOSIS — I359 Nonrheumatic aortic valve disorder, unspecified: Secondary | ICD-10-CM

## 2015-02-21 DIAGNOSIS — J948 Other specified pleural conditions: Secondary | ICD-10-CM

## 2015-02-21 DIAGNOSIS — J90 Pleural effusion, not elsewhere classified: Secondary | ICD-10-CM

## 2015-02-21 DIAGNOSIS — R0600 Dyspnea, unspecified: Secondary | ICD-10-CM

## 2015-02-21 LAB — TSH: TSH: 3.87 u[IU]/mL (ref 0.35–4.50)

## 2015-02-21 LAB — CBC WITH DIFFERENTIAL/PLATELET
BASOS ABS: 0 10*3/uL (ref 0.0–0.1)
Basophils Relative: 0.3 % (ref 0.0–3.0)
EOS ABS: 0.1 10*3/uL (ref 0.0–0.7)
Eosinophils Relative: 1.4 % (ref 0.0–5.0)
HCT: 33.3 % — ABNORMAL LOW (ref 39.0–52.0)
Hemoglobin: 10.5 g/dL — ABNORMAL LOW (ref 13.0–17.0)
LYMPHS ABS: 1.2 10*3/uL (ref 0.7–4.0)
Lymphocytes Relative: 23.4 % (ref 12.0–46.0)
MCHC: 31.5 g/dL (ref 30.0–36.0)
MCV: 100 fl (ref 78.0–100.0)
MONO ABS: 0.4 10*3/uL (ref 0.1–1.0)
MONOS PCT: 8.5 % (ref 3.0–12.0)
NEUTROS ABS: 3.3 10*3/uL (ref 1.4–7.7)
NEUTROS PCT: 66.4 % (ref 43.0–77.0)
PLATELETS: 162 10*3/uL (ref 150.0–400.0)
RBC: 3.33 Mil/uL — AB (ref 4.22–5.81)
RDW: 22.2 % — ABNORMAL HIGH (ref 11.5–15.5)
WBC: 5 10*3/uL (ref 4.0–10.5)

## 2015-02-21 LAB — BASIC METABOLIC PANEL
BUN: 17 mg/dL (ref 6–23)
CALCIUM: 9.3 mg/dL (ref 8.4–10.5)
CHLORIDE: 95 meq/L — AB (ref 96–112)
CO2: 29 mEq/L (ref 19–32)
CREATININE: 4.14 mg/dL — AB (ref 0.40–1.50)
GFR: 14.6 mL/min — AB (ref >60.00)
Glucose, Bld: 97 mg/dL (ref 70–99)
Potassium: 3.6 mEq/L (ref 3.5–5.1)
Sodium: 138 mEq/L (ref 135–145)

## 2015-02-21 LAB — SEDIMENTATION RATE: Sed Rate: 47 mm/hr — ABNORMAL HIGH (ref 0–22)

## 2015-02-21 LAB — BRAIN NATRIURETIC PEPTIDE: PRO B NATRI PEPTIDE: 1353 pg/mL — AB (ref 0.0–100.0)

## 2015-02-21 NOTE — Progress Notes (Signed)
Subjective:    Patient ID: [name], male    DOB: [birth year],   MRN: [id number]    Brief patient profile:  25 yowm HD pt quit smoking 1962 with onset sob x 2006 gradually worse esp since early December 2016 > eval by Dr [name] > pleural fluid on L removed  12/13/14 >some better sob but  for < 24 h and referred to pulmonary clinic  01/17/2015 by Dr [name]    History of Present Illness  01/17/2015 1st [facility] office visit/ [name]   Chief Complaint  Patient presents with  . Pulmonary Consult    Referred by Dr. Lin Patterson. Pt c/o DOE x 3 wks. He states that he gets out of breath walking "one step".  He also c/o minimal  non prod cough for the past few wks.   since breathing worse x 3 weeks more cough/ assoc with dysphagia also  Says has teeth checked every 6 m but no recent dental eval/cleaning  rec Please remember to go to the x-ray department downstairs for your tests - we will call you with the results when they are available. Call your dentist right away as the top molar on R side looks very bad and can make your lung sick.     02/09/2015  f/u ov/[name] re:  L bloody effusion  ? etiology Chief Complaint  Patient presents with  . Follow-up    CXR done today. Pt states his breathing is unchanged. No new co's today.    lie flat ok no 02  Denies pain or h/o trauma L chest.  rec Tap L > tapped R x one liter, atypical cells/ felt better x 2 days   02/21/2015  f/u ov/[name] re: bilateral effusion/ bloody on L / serous on R    Chief Complaint  Patient presents with  . Follow-up    CXR done today. Breathing is no better or worse. No new co'd today.    lasts cards eval with echo 05/12/14 c/w mod lae/RAE and Afib but not a candidate for anticoagulation due to freq falls (though pt denies falling on L side ever) Can still lie down with 1-2 pillows s orthopnea/ doe x room to room but very unsteady on his feet so doesn't walk much     No obvious day to day or daytime variability  or assoc excess/ purulent sputum or mucus plugs  or cp or chest tightness, subjective wheeze or overt sinus or hb symptoms. No unusual exp hx or h/o childhood pna/ asthma or knowledge of premature birth.  Sleeping ok without nocturnal  or early am exacerbation  of respiratory  c/o's or need for noct saba. Also denies any obvious fluctuation of symptoms with weather or environmental changes or other aggravating or alleviating factors except as outlined above   Current Medications, Allergies, Complete Past Medical History, Past Surgical History, Family History, and Social History were reviewed in [employer] record.  ROS  The following are not active complaints unless bolded sore throat, dysphagia, dental problems, itching, sneezing,  nasal congestion or excess/ purulent secretions, ear ache,   fever, chills, sweats, unintended wt loss, classically pleuritic or exertional cp, hemoptysis,  orthopnea pnd or leg swelling, presyncope, palpitations, abdominal pain, anorexia, nausea, vomiting, diarrhea  or change in bowel or bladder habits, change in stools or urine, dysuria,hematuria,  rash, arthralgias, visual complaints, headache, numbness, weakness or ataxia or problems with walking or coordination,  change in mood/affect or memory.  Objective:   Physical Exam  Hoarse amb wm nad  02/21/2015       145  02/09/2015       145    01/17/15 146 lb (66.225 kg)  07/19/14 143 lb (64.864 kg)  05/31/14 147 lb 3.2 oz (66.769 kg)    Vital signs reviewed   HEENT: nl turbinates, and oropharynx dentition poor   . Nl external ear canals without cough reflex   NECK :   Pos JVD to jaw sitting position   / no palp Nodes/TM/ nl carotid upstrokes bilaterally   LUNGS: no acc muscle use,  Nl contour chest with decreased bs with dullness L base/ no egophony    CV:  IRIR    III/VI SEM with reduced S2 but no s3 or  increase in P2,  Trace bilateral lower ext pitting   edema   ABD:  soft and nontender with nl inspiratory excursion in the supine position. No bruits or organomegaly, bowel sounds nl  MS:  Nl gait/ ext warm without deformities, calf tenderness, cyanosis or clubbing No obvious joint restrictions   SKIN: warm and dry without lesions    NEURO:  alert, approp, nl sensorium with  no motor deficits      CXR PA and Lateral:   02/21/2015 :    I personally reviewed images and agree with radiology impression as follows:    1. Stable appearance of left greater than right pleural effusion. 2. Left lower lobe collapse and airspace disease persist. 3. Mild right basilar atelectasis.  Labs ordered/ reviewed:      Chemistry      Component Value Date/Time   NA 138 02/21/2015 1413   NA 142 12/14/2013 1020   K 3.6 02/21/2015 1413   CL 95* 02/21/2015 1413   CO2 29 02/21/2015 1413   BUN 17 02/21/2015 1413   BUN 31* 12/14/2013 1020   CREATININE 4.14* 02/21/2015 1413      Component Value Date/Time   CALCIUM 9.3 02/21/2015 1413   ALKPHOS 136* 12/14/2013 1020   AST 16 12/14/2013 1020   ALT 9 12/14/2013 1020   BILITOT 0.4 12/14/2013 1020        Lab Results  Component Value Date   WBC 5.0 02/21/2015   HGB 10.5* 02/21/2015   HCT 33.3* 02/21/2015   MCV 100.0 02/21/2015   PLT 162.0 02/21/2015        Lab Results  Component Value Date   TSH 3.87 02/21/2015     Lab Results  Component Value Date   PROBNP 1353.0* 02/21/2015       Lab Results  Component Value Date   ESRSEDRATE 47* 02/21/2015          Assessment & Plan:

## 2015-02-21 NOTE — Patient Instructions (Addendum)
Please see patient coordinator before you leave today  to schedule L thoracentesis next convenience   Please remember to go to the lab  department downstairs for your tests - we will call you with the results when they are available.  I will let you know next step after I review your cardiologist's notes and the reuslts from the repeat fluid analysis

## 2015-02-22 ENCOUNTER — Encounter: Payer: Self-pay | Admitting: Internal Medicine

## 2015-02-22 NOTE — Assessment & Plan Note (Signed)
Thoracentesis 02/14/2015   X 1 liter/ glucose 106/ Wbc 160 with L >P cyt POS atypical cells   This is more likely transudative related to diastolic dysfunction and renal failure but would also consider repeating tap on R when more symptomatic  Discussed in detail all the  indications, usual  risks and alternatives  relative to the benefits with patient who agrees to proceed with conservative f/u as outlined

## 2015-02-22 NOTE — Assessment & Plan Note (Addendum)
Echo 05/12/14  EF 55-60%, LAE mod/RAE mod with mild mr mild TR  - 01/17/15    Walked RA x one lap @ 100 stopped due to feet sore/ sob/ no desat    afib is likely making hydrostatic forces worse on R > rec  keep as dry as possible on HD   Total time devoted to counseling  = 20/61m review case with pt/wife discussion of options/alternatives/ giving and going over instructions (see avs)

## 2015-02-22 NOTE — Assessment & Plan Note (Signed)
Echo 03/02/09  AS with mean  gradient 15 and moderate MR Echo 05/12/14 No sign change    F/u cards planned / agree not a good candidate for anticoagualtion ? If really needed anyway as anticoagulated for HD

## 2015-02-22 NOTE — Assessment & Plan Note (Signed)
-   01/17/15    Walked RA x one lap @ 100 stopped due to feet sore/ sob/ no desat     Most likely related to L effusion (see separate a/p)

## 2015-02-22 NOTE — Assessment & Plan Note (Addendum)
S/p Tap 01/12/15  X 400 cc bloody/  PH 7.6   LDH  779 > cytology neg  - 01/17/2015   Walked RA x one lap @ 185 stopped due to pain in legs/ feet and some   Sob but no desat slow pace - 02/14/2015   CT 2. Exudative left pleural effusion with pleural thickening and probable pleural enhancement on the left. Moderate left effusion with atelectasis of much of the left lower lobe and lingula. 3. Patchy ground-glass and nodular airspace opacities throughout the left lung may be from atypical pneumonia or aspiration pneumonitis  Although he has no recollection of falling, he is on HD and I suspect this was a traumatic bloody effusion but would tap dry if possible (to improved atx on L) and repeat cytology > only other option is VATS which he's really not a candidate for at this point in terms of dx studies/ with pleurex for palliation if needed

## 2015-02-23 NOTE — Progress Notes (Signed)
Quick Note:  Spoke with pt and notified of results per Dr. Wert. Pt verbalized understanding and denied any questions.  ______ 

## 2015-02-26 ENCOUNTER — Other Ambulatory Visit: Payer: Self-pay | Admitting: Adult Health

## 2015-03-02 ENCOUNTER — Ambulatory Visit (HOSPITAL_COMMUNITY)
Admission: RE | Admit: 2015-03-02 | Discharge: 2015-03-02 | Disposition: A | Discharge status: Home / Self Care | Payer: Medicare HMO | Source: Ambulatory Visit | Attending: Internal Medicine | Admitting: Internal Medicine

## 2015-03-02 ENCOUNTER — Ambulatory Visit (HOSPITAL_COMMUNITY)
Admission: RE | Admit: 2015-03-02 | Discharge: 2015-03-02 | Disposition: A | Discharge status: Home / Self Care | Payer: Medicare HMO | Source: Ambulatory Visit | Attending: Radiology | Admitting: Radiology

## 2015-03-02 DIAGNOSIS — J948 Other specified pleural conditions: Secondary | ICD-10-CM | POA: Insufficient documentation

## 2015-03-02 DIAGNOSIS — N186 End stage renal disease: Secondary | ICD-10-CM | POA: Insufficient documentation

## 2015-03-02 DIAGNOSIS — Z9889 Other specified postprocedural states: Secondary | ICD-10-CM | POA: Diagnosis not present

## 2015-03-02 DIAGNOSIS — I350 Nonrheumatic aortic (valve) stenosis: Secondary | ICD-10-CM | POA: Insufficient documentation

## 2015-03-02 DIAGNOSIS — J90 Pleural effusion, not elsewhere classified: Secondary | ICD-10-CM

## 2015-03-02 LAB — BODY FLUID CELL COUNT WITH DIFFERENTIAL
Lymphs, Fluid: 89 %
MONOCYTE-MACROPHAGE-SEROUS FLUID: 10 % — AB (ref 50–90)
Neutrophil Count, Fluid: 1 % (ref 0–25)
WBC FLUID: 381 uL (ref 0–1000)

## 2015-03-02 LAB — LACTATE DEHYDROGENASE, PLEURAL OR PERITONEAL FLUID: LD, Fluid: 109 U/L — ABNORMAL HIGH (ref 3–23)

## 2015-03-02 LAB — PROTEIN, BODY FLUID

## 2015-03-02 LAB — GLUCOSE, SEROUS FLUID: Glucose, Fluid: 58 mg/dL

## 2015-03-02 NOTE — Procedures (Signed)
Ultrasound-guided diagnostic left thoracentesis performed yielding 160 cc of turbid, amber  fluid. No immediate complications. Follow-up chest x-ray pending. The fluid was sent to the lab for preordered studies.

## 2015-03-07 NOTE — Progress Notes (Signed)
Quick Note:  Spoke with pt's spouse and notified of results per Dr. Melvyn Novas. She verbalized understanding and denied any questions. She wanted first available appt rather than waiting 2 wks due to pt's breathing not getting better Appt scheduled for 03/09/15 and advised to seek emergent care sooner if needed  ______

## 2015-03-09 ENCOUNTER — Ambulatory Visit (INDEPENDENT_AMBULATORY_CARE_PROVIDER_SITE_OTHER)
Admission: RE | Admit: 2015-03-09 | Discharge: 2015-03-09 | Disposition: A | Discharge status: Home / Self Care | Payer: Medicare HMO | Source: Ambulatory Visit | Attending: Internal Medicine | Admitting: Internal Medicine

## 2015-03-09 ENCOUNTER — Encounter: Payer: Self-pay | Admitting: Internal Medicine

## 2015-03-09 ENCOUNTER — Ambulatory Visit (INDEPENDENT_AMBULATORY_CARE_PROVIDER_SITE_OTHER): Payer: Medicare HMO | Admitting: Internal Medicine

## 2015-03-09 VITALS — BP 126/64 | HR 103 | Ht 69.0 in | Wt 142.0 lb

## 2015-03-09 DIAGNOSIS — J948 Other specified pleural conditions: Secondary | ICD-10-CM | POA: Diagnosis not present

## 2015-03-09 DIAGNOSIS — J90 Pleural effusion, not elsewhere classified: Secondary | ICD-10-CM

## 2015-03-09 NOTE — Patient Instructions (Signed)
All we can recommend at this point is surgery on Left (which you can't apparently have)  and a tube on Right which you doesn't need at present and I am certainly willing to consider referring you to a T surgeon for either procedure   We could arrange another R thoracentesis to be complete if needed but this could be arranged by Dr Lin Landsman if needed in [place]

## 2015-03-09 NOTE — Progress Notes (Signed)
Quick Note:  Discussed with patient at ov ______

## 2015-03-09 NOTE — Progress Notes (Signed)
Subjective:    Patient ID: [name], male    DOB: 09-18-1926,   MRN: [id number]    Brief patient profile:  23 yowm HD pt quit smoking 1962 with onset sob x 2006 gradually worse esp since early December 2016 > eval by Dr Lin Landsman > pleural fluid on L removed  12/13/14 >some better sob but  for < 24 h and referred to pulmonary clinic  01/17/2015 by Dr Lin Landsman    History of Present Illness  01/17/2015 1st [facility] office visit/ Lekeshia Kram   Chief Complaint  Patient presents with  . Pulmonary Consult    Referred by Dr. Lin Landsman. Pt c/o DOE x 3 wks. He states that he gets out of breath walking "one step".  He also c/o minimal  non prod cough for the past few wks.   since breathing worse x 3 weeks more cough/ assoc with dysphagia also  Says has teeth checked every 6 m but no recent dental eval/cleaning  rec Please remember to go to the x-ray department downstairs for your tests - we will call you with the results when they are available. Call your dentist right away as the top molar on R side looks very bad and can make your lung sick.     02/09/2015  f/u ov/Bocephus Cali re:  L bloody effusion  ? etiology Chief Complaint  Patient presents with  . Follow-up    CXR done today. Pt states his breathing is unchanged. No new co's today.    lie flat ok no 02  Denies pain or h/o trauma L chest.  rec Tap L > tapped R x one liter, atypical cells/ felt better x 2 days   02/21/2015  f/u ov/Diesha Rostad re: bilateral effusion/ bloody on L / serous on R    Chief Complaint  Patient presents with  . Follow-up    CXR done today. Breathing is no better or worse. No new co'd today.    lasts cards eval with echo 05/12/14 c/w mod lae/RAE and Afib but not a candidate for anticoagulation due to freq falls (though pt denies falling on L side ever) Can still lie down with 1-2 pillows s orthopnea/ doe x room to room but very unsteady on his feet so doesn't walk much   rec Please see patient coordinator before you  leave today  to schedule L thoracentesis next convenience  I will let you know next step after I review your cardiologist's notes and the reuslts from the repeat fluid analysis> dx exudate, neg cyt/ cards notes not op candidate for ihd / valvular ht dz     03/09/2015  f/u ov/Denece Shearer re: bilateral effusions R > L / exudative with pos atypical cells only on R Chief Complaint  Patient presents with  . Follow-up    Breathing has improved slightly. Cough is unchanged.   only a smidge better breathing and only for a few days after the larger amt was drained - no change p most recent t centesis on L yielding only 160 cc  No obvious day to day or daytime variability or assoc excess/ purulent sputum or mucus plugs  or cp or chest tightness, subjective wheeze or overt sinus or hb symptoms. No unusual exp hx or h/o childhood pna/ asthma or knowledge of premature birth.  Sleeping ok without nocturnal  or early am exacerbation  of respiratory  c/o's or need for noct saba. Also denies any obvious fluctuation of symptoms with weather or environmental changes or  other aggravating or alleviating factors except as outlined above   Current Medications, Allergies, Complete Past Medical History, Past Surgical History, Family History, and Social History were reviewed in [employer] record.  ROS  The following are not active complaints unless bolded sore throat, dysphagia, dental problems, itching, sneezing,  nasal congestion or excess/ purulent secretions, ear ache,   fever, chills, sweats, unintended wt loss, classically pleuritic or exertional cp, hemoptysis,  orthopnea pnd or leg swelling, presyncope, palpitations, abdominal pain, anorexia, nausea, vomiting, diarrhea  or change in bowel or bladder habits, change in stools or urine, dysuria,hematuria,  rash, arthralgias, visual complaints, headache, numbness, weakness or ataxia or problems with walking or coordination,  change in mood/affect or  memory.                       Objective:   Physical Exam  Hoarse amb wm nad  02/21/2015       145 >  03/09/2015    142  02/09/2015       145    01/17/15 146 lb (66.225 kg)  07/19/14 143 lb (64.864 kg)  05/31/14 147 lb 3.2 oz (66.769 kg)    Vital signs reviewed   HEENT: nl turbinates, and oropharynx dentition poor   . Nl external ear canals without cough reflex   NECK :   Pos JVD to jaw sitting position   / no palp Nodes/TM/ nl carotid upstrokes bilaterally   LUNGS: no acc muscle use,  Nl contour chest with decreased bs with dullness R base/ no egophony    CV:  IRIR    III/VI SEM with reduced S2 but no s3 or  increase in P2,  Trace bilateral lower ext pitting  edema   ABD:  soft and nontender with nl inspiratory excursion in the supine position. No bruits or organomegaly, bowel sounds nl  MS:  Nl gait/ ext warm without deformities, calf tenderness, cyanosis or clubbing No obvious joint restrictions   SKIN: warm and dry without lesions    NEURO:  alert, approp, nl sensorium with  no motor deficits        CXR PA and Lateral:   03/09/2015 :    I personally reviewed images and agree with radiology impression as follows:    Fairly stable appearance of the moderate-sized left pleural effusion. Small right pleural effusion is also stable.              Assessment & Plan:

## 2015-03-12 NOTE — Assessment & Plan Note (Signed)
S/p Tap 01/12/15  X 400 cc bloody/  PH 7.6   LDH  779 > cytology neg  - 01/17/2015   Walked RA x one lap @ 185 stopped due to pain in legs/ feet and some   Sob but no desat slow pace - 02/14/2015   CT 2. Exudative left pleural effusion with pleural thickening and probable pleural enhancement on the left. Moderate left effusion with atelectasis of much of the left lower lobe and lingula. 3. Patchy ground-glass and nodular airspace opacities throughout the left lung may be from atypical pneumonia or aspiration pneumonitis - Retap on L 03/02/2015 160 cc of turbid, amber fluid was removed>  abund lymphocytes, neg flow cyt  Best bet is he fell and had a contusion/ bloody effusion with some pockets of loculated fluid though we only got more serous fluid at tap and no need for further eval/ rx of this problem as it is the much less than the two

## 2015-03-12 NOTE — Assessment & Plan Note (Addendum)
Thoracentesis 02/14/2015   X 1 liter/ glucose 106/ Wbc 160 with L >P,  cyt POS atypical cells   Of the two this is much more worrisome for ca but not enough to retap at present and poor candidate for anything but palliative pleurex so will leave well enough along for now  Discussed in detail all the  indications, usual  risks and alternatives  relative to the benefits with patient and wife who agree to proceed with conservative f/u as outlined  Unless orthopnea develops in interim   I had an extended discussion with the patient reviewing all relevant studies completed to date and  lasting 15 to 20 minutes of a 25 minute visit    Each maintenance medication was reviewed in detail including most importantly the difference between maintenance and prns and under what circumstances the prns are to be triggered using an action plan format that is not reflected in the computer generated alphabetically organized AVS.    Please see instructions for details which were reviewed in writing and the patient given a copy highlighting the part that I personally wrote and discussed at today's ov.

## 2015-03-20 ENCOUNTER — Other Ambulatory Visit: Payer: Self-pay | Admitting: Adult Health

## 2015-03-22 ENCOUNTER — Telehealth: Payer: Self-pay | Admitting: Adult Health

## 2015-03-22 MED ORDER — SERTRALINE HCL 25 MG PO TABS: 25.0000 mg | ORAL_TABLET | Freq: Every day | ORAL | Status: DC

## 2015-03-22 NOTE — Telephone Encounter (Signed)
Wife [place] called to request refills of lamoTRIgine (LAMICTAL) 100 MG tablet and sertraline (ZOLOFT) 25 MG tablet.

## 2015-03-22 NOTE — Telephone Encounter (Signed)
Called wife again. Advised refill was sent on 03/22/15 for lamotrigine. She is going to call pharmacy and see if refill is there and call me if there is an issue. She stated pt got rx zoloft on 03/01/15 and needs a refill. Advised I will check with Megan and refill if ok. If not, I will call her back. She verbalized understanding.

## 2015-03-22 NOTE — Telephone Encounter (Signed)
Tried calling wife back. Phone continued to ring. Did not go to VM. Will try again later.

## 2015-03-22 NOTE — Telephone Encounter (Signed)
Spoke to [employer]. Ok to refill zoloft with 11 refills. Sent rx to pharmacy.

## 2015-05-22 ENCOUNTER — Other Ambulatory Visit: Payer: Self-pay | Admitting: *Deleted

## 2015-05-22 ENCOUNTER — Telehealth: Payer: Self-pay | Admitting: *Deleted

## 2015-05-22 MED ORDER — SERTRALINE HCL 25 MG PO TABS: 25.0000 mg | ORAL_TABLET | Freq: Every day | ORAL | Status: DC

## 2015-05-22 MED ORDER — LAMOTRIGINE 100 MG PO TABS: ORAL_TABLET | ORAL | Status: DC

## 2015-05-22 NOTE — Telephone Encounter (Signed)
Refilled at 90 day supply.  Has appt 08-08-15 at 1000.

## 2015-05-22 NOTE — Telephone Encounter (Signed)
RV 08-08-15.

## 2015-06-12 ENCOUNTER — Telehealth: Payer: Self-pay | Admitting: *Deleted

## 2015-06-12 ENCOUNTER — Other Ambulatory Visit: Payer: Self-pay | Admitting: Adult Health

## 2015-06-12 NOTE — Telephone Encounter (Signed)
Remeron rx faxed and confirmed to [facility] at [phone].

## 2015-08-08 ENCOUNTER — Encounter: Payer: Self-pay | Admitting: Adult Health

## 2015-08-08 ENCOUNTER — Ambulatory Visit (INDEPENDENT_AMBULATORY_CARE_PROVIDER_SITE_OTHER): Payer: Medicare HMO | Admitting: Adult Health

## 2015-08-08 VITALS — BP 128/68 | HR 58 | Resp 16 | Ht 69.0 in | Wt 146.0 lb

## 2015-08-08 DIAGNOSIS — R413 Other amnesia: Secondary | ICD-10-CM

## 2015-08-08 DIAGNOSIS — G609 Hereditary and idiopathic neuropathy, unspecified: Secondary | ICD-10-CM

## 2015-08-08 DIAGNOSIS — R569 Unspecified convulsions: Secondary | ICD-10-CM | POA: Diagnosis not present

## 2015-08-08 MED ORDER — MIRTAZAPINE 30 MG PO TABS: 30.0000 mg | ORAL_TABLET | Freq: Every day | ORAL | Status: DC

## 2015-08-08 MED ORDER — SERTRALINE HCL 25 MG PO TABS: 25.0000 mg | ORAL_TABLET | Freq: Every day | ORAL | Status: AC

## 2015-08-08 MED ORDER — LAMOTRIGINE 100 MG PO TABS: ORAL_TABLET | ORAL | Status: AC

## 2015-08-08 NOTE — Progress Notes (Signed)
I have reviewed and agreed above plan. 

## 2015-08-08 NOTE — Progress Notes (Signed)
PATIENT: [name] DOB: 01/11/1927  REASON FOR VISIT: follow up- seizures, peripheral neuropathy, memory loss HISTORY FROM: patient  HISTORY OF PRESENT ILLNESS: Today 08/08/15: [name] is an 82 year old male with a history of seizures, peripheral neuropathy and memory loss. He returns today for follow-up. The patient reports that he is not had any seizure events. He continues on Lamictal. He lives at home with his wife. He is able to complete all ADLs. He does not operate a motor vehicle. He continues on dialysis. In the past he has noticed some memory changes. Wife states that she continues to notice some memory loss however no significant changes since the last visit. Patient also has a diagnosis of peripheral neuropathy fortunately he does not have any discomfort only numbness. He uses a Radiation protection practitioner when ambulating. He is not had any recent falls but wife reports he is unsteady. He returns today for an evaluation.  HISTORY 02/02/15 (MM)  [name] is an 82 year old male with a history of seizures, peripheral/ neuropathy and memory loss. He returns today for follow-up. Patient reportsthat he has not had any seizure events. He continues to take Lamictal and tolerates it well. Patient reports that he does not have any discomfort with his peripheral neuropathy. He reports that he just has numbness in the lower extremities. Patient uses a cane when ambulating. He does have a walker and uses it in his home. The patient does report that he's had several falls but no significant injuries. Patient does feel that his memory has gotten slightly worse. In the past he has been unable to tolerate Aricept and could not initiate Namenda due to renal impairment. He reports that he is able to complete all ADLs independently. He no longer operates a Teacher, music. His wife completes the finances now. Wife reports that he does fairly well at home. She has not noticed any significant changes. Patient does report  shortness of breath with ambulation. He states that he's been following with a pulmonologist so far has not received any answers for his shortness of breath. He denies any new neurological symptoms. He returns today for an evaluation.  HISTORY 07/19/14:[name] is an 82 year old male with a history of seizures, peripheral neuropathy and memory loss. He returns today for follow-up. He is currently taking Lamictal for seizures and tolerating it well. He denies any seizure events. The patient feels that his memory has remained the same. In the past he has tried Aricept but was unable to tolerate the medication. He was unable to start Namenda due to renal impairment. The patient is able to complete all ADLs independently. He no longer operates a Teacher, music. Patient continues to have numbness in the feet. Denies any pain. The patient has had a recent fall. He states that he likes to work in the woods and fell while out there. Denies any injuries. Patient continues to use a loft for depressed mood. He states that this works well for him. Overall the patient feels that things have remained the same. Denies any new medical history. He returns today for evaluation.   REVIEW OF SYSTEMS: Out of a complete 14 system review of symptoms, the patient complains only of the following symptoms, and all other reviewed systems are negative.  Activity change, memory loss, bruise easily, speech difficulty, walking difficulty, confusion, decreased concentration, rash  ALLERGIES: No Known Allergies  HOME MEDICATIONS: Outpatient Prescriptions Prior to Visit  Medication Sig Dispense Refill  . allopurinol (ZYLOPRIM) 100 MG tablet Take  100 mg by mouth daily.    Marland Kitchen aspirin 81 MG tablet Take 81 mg by mouth daily.      . B Complex-C-Folic Acid (DIALYVITE TABLET) TABS Take 1 tablet by mouth daily.     . calcium carbonate (TUMS - DOSED IN MG ELEMENTAL CALCIUM) 500 MG chewable tablet Chew 1 tablet by mouth 2 (two) times daily with  a meal.     . carvedilol (COREG) 25 MG tablet Take 25 mg by mouth at bedtime.     . Cholecalciferol (VITAMIN D-3 PO) Take 2 tablets by mouth every morning.     . folic acid (FOLVITE) 1 MG tablet Take 1 mg by mouth daily.      Marland Kitchen latanoprost (XALATAN) 0.005 % ophthalmic solution     . levothyroxine (SYNTHROID, LEVOTHROID) 75 MCG tablet Take 75 mcg by mouth daily before breakfast.    . omeprazole (PRILOSEC) 20 MG capsule Take 20 mg by mouth daily.    . simvastatin (ZOCOR) 20 MG tablet Take 20 mg by mouth at bedtime.      . lamoTRIgine (LAMICTAL) 100 MG tablet TAKE 1/2 TABLET BY MOUTH EVERY MORNING AND 1 TABLET AT BEDTIME 135 tablet 0  . mirtazapine (REMERON) 30 MG tablet TAKE 1 TABLET BY MOUTH AT BEDTIME 90 tablet 0  . sertraline (ZOLOFT) 25 MG tablet Take 1 tablet (25 mg total) by mouth daily. 90 tablet 0   No facility-administered medications prior to visit.    PAST MEDICAL HISTORY: Past Medical History  Diagnosis Date  . Colon polyp     adenomatous  . Renal failure   . Hyperlipidemia   . Gout   . Benign prostatic hypertrophy   . Aortic stenosis   . History of Clostridium difficile infection   . Hyperparathyroidism   . Anemia     of chronic disease  . Hypertension   . Hypothyroidism   . Hernia of unspecified site of abdominal cavity without mention of obstruction or gangrene     hiatal  . Diverticulosis   . Proctitis   . Internal hemorrhoids   . Seizures ([place])   . Colon cancer ([place])   . Dementia   . Depression   . Encounter for long-term (current) use of other medications   . Unspecified hereditary and idiopathic peripheral neuropathy   . Unspecified transient cerebral ischemia   . Acute infective polyneuritis ([place])   . Polyneuropathy in other diseases classified elsewhere ([place])   . End stage renal disease ([place])   . Atrial fibrillation ([place])     PAST SURGICAL HISTORY: Past Surgical History  Procedure Laterality Date  . Kidney transplant      1998  . Skin cancer  excision      FAMILY HISTORY: Family History  Problem Relation Age of Onset  . Diabetes Brother   . Colon cancer Neg Hx   . Liver disease Son   . Stroke Father     SOCIAL HISTORY: Social History   Social History  . Marital Status: Married    Spouse Name: [place]  . Number of Children: 3  . Years of Education: 12   Occupational History  . Retired     Retired   Social History Main Topics  . Smoking status: Former Smoker -- 3.00 packs/day for 40 years    Types: Cigarettes    Quit date: 10/29/1960  . Smokeless tobacco: Never Used  . Alcohol Use: No  . Drug Use: No  . Sexual Activity: Not on file  Other Topics Concern  . Not on file   Social History Narrative   Patient is married and to [place].    Caffeine three times a week.   Right handed.   Education- 12 th grade.      PHYSICAL EXAM  Filed Vitals:   08/08/15 0953  BP: 128/68  Pulse: 58  Resp: 16  Height: 5\' 9"  (1.753 m)  Weight: 146 lb (66.225 kg)  SpO2: 91%   Body mass index is 21.55 kg/(m^2).  MMSE - Mini Mental State Exam 08/08/2015 02/02/2015 07/19/2014  Orientation to time 4 5 5   Orientation to Place 4 2 5   Registration 3 3 3   Attention/ Calculation 4 0 4  Recall 2 3 3   Language- name 2 objects 2 2 2   Language- repeat 1 0 1  Language- follow 3 step command 3 3 3   Language- read & follow direction 1 1 1   Write a sentence 1 0 0  Copy design 1 1 1   Total score 26 20 28      Generalized: Well developed, in no acute distress   Neurological examination  Mentation: Alert oriented to  place, history taking. Follows all commands speech and language fluent Cranial nerve II-XII: Pupils were equal round reactive to light. Extraocular movements were full, visual field were full on confrontational test. Facial sensation and strength were normal. Uvula tongue midline. Head turning and shoulder shrug  were normal and symmetric. Motor: The motor testing reveals 5 over 5 strength of all 4 extremities.  Good symmetric motor tone is noted throughout.  Sensory: Sensory testing is intact to soft touch on all 4 extremities. No evidence of extinction is noted.  Coordination: Cerebellar testing reveals good finger-nose-finger and heel-to-shin bilaterally.  Gait and station: Gait is unsteady. He uses a Radiation protection practitioner when ambulating. Tandem gait not attempted. Tandem gait is normal. Reflexes: Deep tendon reflexes are symmetric and normal bilaterally.   DIAGNOSTIC DATA (LABS, IMAGING, TESTING) - I reviewed patient records, labs, notes, testing and imaging myself where available.  Lab Results  Component Value Date   WBC 5.0 02/21/2015   HGB 10.5* 02/21/2015   HCT 33.3* 02/21/2015   MCV 100.0 02/21/2015   PLT 162.0 02/21/2015      Component Value Date/Time   NA 138 02/21/2015 1413   NA 142 12/14/2013 1020   K 3.6 02/21/2015 1413   CL 95* 02/21/2015 1413   CO2 29 02/21/2015 1413   GLUCOSE 97 02/21/2015 1413   GLUCOSE 82 12/14/2013 1020   BUN 17 02/21/2015 1413   BUN 31* 12/14/2013 1020   CREATININE 4.14* 02/21/2015 1413   CALCIUM 9.3 02/21/2015 1413   PROT 7.4 12/14/2013 1020   PROT 6.3 04/02/2013 2330   ALBUMIN 4.3 12/14/2013 1020   ALBUMIN 2.7* 04/05/2013 1300   AST 16 12/14/2013 1020   ALT 9 12/14/2013 1020   ALKPHOS 136* 12/14/2013 1020   BILITOT 0.4 12/14/2013 1020   GFRNONAA 7* 12/14/2013 1020   GFRAA 9* 12/14/2013 1020     Lab Results  Component Value Date   TSH 3.87 02/21/2015      ASSESSMENT AND PLAN 82 y.o. year old male  has a past medical history of Colon polyp; Renal failure; Hyperlipidemia; Gout; Benign prostatic hypertrophy; Aortic stenosis; History of Clostridium difficile infection; Hyperparathyroidism; Anemia; Hypertension; Hypothyroidism; Hernia of unspecified site of abdominal cavity without mention of obstruction or gangrene; Diverticulosis; Proctitis; Internal hemorrhoids; Seizures ([place]); Colon cancer ([place]); Dementia; Depression; Encounter for long-term  (current) use of other  medications; Unspecified hereditary and idiopathic peripheral neuropathy; Unspecified transient cerebral ischemia; Acute infective polyneuritis ([place]); Polyneuropathy in other diseases classified elsewhere [facility]); End stage renal disease ([place]); and Atrial fibrillation ([place]). here with:  1. Peripheral neuropathy 2. Seizures 3. Memory disturbance  Overall the patient is doing well. He will continue on Lamictal. He states that he gets blood drawn with dialysis. I have advised the patient to ask for a Lamictal level to be drawn at his next blood draw. Patient's memory score is stable. We will continue to monitor. Patient is encouraged to use his Rollator when ambulating. He will follow-up in 6 months with Dr. Krista Blue.Ward Givens, MSN, NP-C 08/08/2015, 10:31 AM [facility] [address], [place], [place] [address] [phone]

## 2015-08-08 NOTE — Patient Instructions (Addendum)
Continue lamictal  Continue Remeron and zoloft Ask if lamictal level can be checked with next blood draw If your symptoms worsen or you develop new symptoms please let us know.

## 2015-08-10 ENCOUNTER — Telehealth: Payer: Self-pay | Admitting: Adult Health

## 2015-08-10 ENCOUNTER — Other Ambulatory Visit: Payer: Self-pay | Admitting: *Deleted

## 2015-08-10 DIAGNOSIS — R569 Unspecified convulsions: Secondary | ICD-10-CM

## 2015-08-10 NOTE — Telephone Encounter (Signed)
Patient's wife is calling regarding the patient. She states [facility] said they could not get a Lamictal Level on the patient because they are not set up to do that kind of test.

## 2015-08-10 NOTE — Progress Notes (Signed)
Yes trough level

## 2015-08-10 NOTE — Telephone Encounter (Signed)
FYI, placed order for lamictal level. See order entry note.

## 2015-08-10 NOTE — Progress Notes (Signed)
I called wife and pt has appt with pcp, Dr. Lin Landsman on Monday 08-14-15 at 1435.  I called Dr. Janace Aris office and they have a [facility] within there office.  They can draw.  Pt takes his lamictal at 0800 and 2000.  Do you want a trough level?  As per below, yes trough level.  I spoke with wife and pt will take his morning dose at 0600 and will have drawn when in to see Dr. Lin Landsman in the afternoon. Faxed over request (confirmation received).

## 2015-08-17 ENCOUNTER — Telehealth: Payer: Self-pay | Admitting: Adult Health

## 2015-08-17 NOTE — Telephone Encounter (Signed)
[facility] sent lab results  Lamictal 4.4- normal

## 2015-08-28 ENCOUNTER — Emergency Department (HOSPITAL_COMMUNITY): Payer: Medicare HMO

## 2015-08-28 ENCOUNTER — Inpatient Hospital Stay (HOSPITAL_COMMUNITY)
Admission: EM | Admit: 2015-08-28 | Discharge: 2015-09-12 | DRG: 871 | Disposition: A | Discharge status: Hospice | Payer: Medicare HMO | Attending: Internal Medicine | Admitting: Internal Medicine

## 2015-08-28 ENCOUNTER — Other Ambulatory Visit (HOSPITAL_COMMUNITY): Payer: Self-pay

## 2015-08-28 ENCOUNTER — Inpatient Hospital Stay (HOSPITAL_COMMUNITY): Payer: Medicare HMO

## 2015-08-28 ENCOUNTER — Encounter (HOSPITAL_COMMUNITY): Payer: Self-pay

## 2015-08-28 DIAGNOSIS — J189 Pneumonia, unspecified organism: Secondary | ICD-10-CM

## 2015-08-28 DIAGNOSIS — N186 End stage renal disease: Secondary | ICD-10-CM | POA: Diagnosis present

## 2015-08-28 DIAGNOSIS — Z940 Kidney transplant status: Secondary | ICD-10-CM

## 2015-08-28 DIAGNOSIS — Z7189 Other specified counseling: Secondary | ICD-10-CM | POA: Diagnosis not present

## 2015-08-28 DIAGNOSIS — D539 Nutritional anemia, unspecified: Secondary | ICD-10-CM | POA: Diagnosis present

## 2015-08-28 DIAGNOSIS — L8990 Pressure ulcer of unspecified site, unspecified stage: Secondary | ICD-10-CM | POA: Insufficient documentation

## 2015-08-28 DIAGNOSIS — L89222 Pressure ulcer of left hip, stage 2: Secondary | ICD-10-CM | POA: Clinically undetermined

## 2015-08-28 DIAGNOSIS — I480 Paroxysmal atrial fibrillation: Secondary | ICD-10-CM

## 2015-08-28 DIAGNOSIS — R5381 Other malaise: Secondary | ICD-10-CM

## 2015-08-28 DIAGNOSIS — E8770 Fluid overload, unspecified: Secondary | ICD-10-CM | POA: Diagnosis present

## 2015-08-28 DIAGNOSIS — R7881 Bacteremia: Secondary | ICD-10-CM

## 2015-08-28 DIAGNOSIS — G8929 Other chronic pain: Secondary | ICD-10-CM | POA: Diagnosis present

## 2015-08-28 DIAGNOSIS — Z87891 Personal history of nicotine dependence: Secondary | ICD-10-CM | POA: Diagnosis not present

## 2015-08-28 DIAGNOSIS — F0390 Unspecified dementia without behavioral disturbance: Secondary | ICD-10-CM | POA: Diagnosis present

## 2015-08-28 DIAGNOSIS — D696 Thrombocytopenia, unspecified: Secondary | ICD-10-CM | POA: Diagnosis present

## 2015-08-28 DIAGNOSIS — R7989 Other specified abnormal findings of blood chemistry: Secondary | ICD-10-CM

## 2015-08-28 DIAGNOSIS — Z66 Do not resuscitate: Secondary | ICD-10-CM | POA: Diagnosis not present

## 2015-08-28 DIAGNOSIS — I350 Nonrheumatic aortic (valve) stenosis: Secondary | ICD-10-CM | POA: Diagnosis present

## 2015-08-28 DIAGNOSIS — A419 Sepsis, unspecified organism: Secondary | ICD-10-CM

## 2015-08-28 DIAGNOSIS — E876 Hypokalemia: Secondary | ICD-10-CM | POA: Diagnosis present

## 2015-08-28 DIAGNOSIS — D631 Anemia in chronic kidney disease: Secondary | ICD-10-CM | POA: Diagnosis present

## 2015-08-28 DIAGNOSIS — R945 Abnormal results of liver function studies: Secondary | ICD-10-CM

## 2015-08-28 DIAGNOSIS — Z7982 Long term (current) use of aspirin: Secondary | ICD-10-CM | POA: Diagnosis not present

## 2015-08-28 DIAGNOSIS — N189 Chronic kidney disease, unspecified: Secondary | ICD-10-CM

## 2015-08-28 DIAGNOSIS — J155 Pneumonia due to Escherichia coli: Secondary | ICD-10-CM | POA: Diagnosis present

## 2015-08-28 DIAGNOSIS — Z992 Dependence on renal dialysis: Secondary | ICD-10-CM

## 2015-08-28 DIAGNOSIS — G9340 Encephalopathy, unspecified: Secondary | ICD-10-CM | POA: Diagnosis not present

## 2015-08-28 DIAGNOSIS — M109 Gout, unspecified: Secondary | ICD-10-CM | POA: Diagnosis present

## 2015-08-28 DIAGNOSIS — R52 Pain, unspecified: Secondary | ICD-10-CM

## 2015-08-28 DIAGNOSIS — N185 Chronic kidney disease, stage 5: Secondary | ICD-10-CM | POA: Diagnosis not present

## 2015-08-28 DIAGNOSIS — N2581 Secondary hyperparathyroidism of renal origin: Secondary | ICD-10-CM | POA: Diagnosis present

## 2015-08-28 DIAGNOSIS — I959 Hypotension, unspecified: Secondary | ICD-10-CM | POA: Diagnosis not present

## 2015-08-28 DIAGNOSIS — Y92009 Unspecified place in unspecified non-institutional (private) residence as the place of occurrence of the external cause: Secondary | ICD-10-CM | POA: Diagnosis not present

## 2015-08-28 DIAGNOSIS — G40909 Epilepsy, unspecified, not intractable, without status epilepticus: Secondary | ICD-10-CM | POA: Diagnosis present

## 2015-08-28 DIAGNOSIS — W19XXXA Unspecified fall, initial encounter: Secondary | ICD-10-CM | POA: Diagnosis present

## 2015-08-28 DIAGNOSIS — Z515 Encounter for palliative care: Secondary | ICD-10-CM | POA: Diagnosis not present

## 2015-08-28 DIAGNOSIS — J90 Pleural effusion, not elsewhere classified: Secondary | ICD-10-CM | POA: Diagnosis present

## 2015-08-28 DIAGNOSIS — I120 Hypertensive chronic kidney disease with stage 5 chronic kidney disease or end stage renal disease: Secondary | ICD-10-CM | POA: Diagnosis present

## 2015-08-28 DIAGNOSIS — Z86010 Personal history of colonic polyps: Secondary | ICD-10-CM

## 2015-08-28 DIAGNOSIS — E871 Hypo-osmolality and hyponatremia: Secondary | ICD-10-CM | POA: Diagnosis not present

## 2015-08-28 DIAGNOSIS — I482 Chronic atrial fibrillation: Secondary | ICD-10-CM | POA: Diagnosis present

## 2015-08-28 DIAGNOSIS — Z8673 Personal history of transient ischemic attack (TIA), and cerebral infarction without residual deficits: Secondary | ICD-10-CM

## 2015-08-28 DIAGNOSIS — Z452 Encounter for adjustment and management of vascular access device: Secondary | ICD-10-CM

## 2015-08-28 DIAGNOSIS — G610 Guillain-Barre syndrome: Secondary | ICD-10-CM | POA: Diagnosis present

## 2015-08-28 DIAGNOSIS — E039 Hypothyroidism, unspecified: Secondary | ICD-10-CM | POA: Diagnosis present

## 2015-08-28 DIAGNOSIS — A4151 Sepsis due to Escherichia coli [E. coli]: Principal | ICD-10-CM | POA: Diagnosis present

## 2015-08-28 DIAGNOSIS — R6521 Severe sepsis with septic shock: Secondary | ICD-10-CM | POA: Diagnosis not present

## 2015-08-28 DIAGNOSIS — Z823 Family history of stroke: Secondary | ICD-10-CM

## 2015-08-28 DIAGNOSIS — I9589 Other hypotension: Secondary | ICD-10-CM | POA: Diagnosis present

## 2015-08-28 DIAGNOSIS — H9190 Unspecified hearing loss, unspecified ear: Secondary | ICD-10-CM | POA: Diagnosis present

## 2015-08-28 DIAGNOSIS — Z85828 Personal history of other malignant neoplasm of skin: Secondary | ICD-10-CM

## 2015-08-28 DIAGNOSIS — Z85038 Personal history of other malignant neoplasm of large intestine: Secondary | ICD-10-CM

## 2015-08-28 HISTORY — DX: Hyperparathyroidism, unspecified: E21.3

## 2015-08-28 HISTORY — DX: Personal history of other diseases of the nervous system and sense organs: Z86.69

## 2015-08-28 HISTORY — DX: Other pancytopenia: D61.818

## 2015-08-28 HISTORY — DX: Unspecified hearing loss, unspecified ear: H91.90

## 2015-08-28 LAB — COMPREHENSIVE METABOLIC PANEL
ALT: 38 U/L (ref 17–63)
AST: 133 U/L — AB (ref 15–41)
Albumin: 2.8 g/dL — ABNORMAL LOW (ref 3.5–5.0)
Alkaline Phosphatase: 332 U/L — ABNORMAL HIGH (ref 38–126)
Anion gap: 10 (ref 5–15)
BUN: 28 mg/dL — AB (ref 6–20)
CHLORIDE: 98 mmol/L — AB (ref 101–111)
CO2: 28 mmol/L (ref 22–32)
Calcium: 8.7 mg/dL — ABNORMAL LOW (ref 8.9–10.3)
Creatinine, Ser: 5.2 mg/dL — ABNORMAL HIGH (ref 0.61–1.24)
GFR, EST AFRICAN AMERICAN: 10 mL/min — AB (ref >60)
GFR, EST NON AFRICAN AMERICAN: 9 mL/min — AB (ref >60)
Glucose, Bld: 88 mg/dL (ref 65–99)
POTASSIUM: 3.3 mmol/L — AB (ref 3.5–5.1)
SODIUM: 136 mmol/L (ref 135–145)
Total Bilirubin: 3.7 mg/dL — ABNORMAL HIGH (ref 0.3–1.2)
Total Protein: 5.7 g/dL — ABNORMAL LOW (ref 6.5–8.1)

## 2015-08-28 LAB — TROPONIN I
Troponin I: 0.09 ng/mL (ref <0.03)
Troponin I: 0.16 ng/mL (ref <0.03)

## 2015-08-28 LAB — I-STAT ARTERIAL BLOOD GAS, ED
Acid-Base Excess: 9 mmol/L — ABNORMAL HIGH (ref 0.0–2.0)
Bicarbonate: 32 mEq/L — ABNORMAL HIGH (ref 20.0–24.0)
O2 SAT: 100 %
PCO2 ART: 38.2 mmHg (ref 35.0–45.0)
PH ART: 7.53 — AB (ref 7.350–7.450)
Patient temperature: 98.5
TCO2: 33 mmol/L (ref 0–100)
pO2, Arterial: 265 mmHg — ABNORMAL HIGH (ref 80.0–100.0)

## 2015-08-28 LAB — I-STAT CHEM 8, ED
BUN: 28 mg/dL — ABNORMAL HIGH (ref 6–20)
CHLORIDE: 94 mmol/L — AB (ref 101–111)
Calcium, Ion: 1.03 mmol/L — ABNORMAL LOW (ref 1.12–1.23)
Creatinine, Ser: 5 mg/dL — ABNORMAL HIGH (ref 0.61–1.24)
Glucose, Bld: 81 mg/dL (ref 65–99)
HEMATOCRIT: 32 % — AB (ref 39.0–52.0)
HEMOGLOBIN: 10.9 g/dL — AB (ref 13.0–17.0)
POTASSIUM: 3.3 mmol/L — AB (ref 3.5–5.1)
Sodium: 138 mmol/L (ref 135–145)
TCO2: 29 mmol/L (ref 0–100)

## 2015-08-28 LAB — DIFFERENTIAL
BASOS ABS: 0 10*3/uL (ref 0.0–0.1)
BASOS PCT: 0 %
EOS ABS: 0 10*3/uL (ref 0.0–0.7)
Eosinophils Relative: 0 %
Lymphocytes Relative: 8 %
Lymphs Abs: 0.5 10*3/uL — ABNORMAL LOW (ref 0.7–4.0)
MONOS PCT: 5 %
Monocytes Absolute: 0.3 10*3/uL (ref 0.1–1.0)
NEUTROS ABS: 5 10*3/uL (ref 1.7–7.7)
Neutrophils Relative %: 86 %

## 2015-08-28 LAB — CBC
HEMATOCRIT: 31 % — AB (ref 39.0–52.0)
Hemoglobin: 9.5 g/dL — ABNORMAL LOW (ref 13.0–17.0)
MCH: 33 pg (ref 26.0–34.0)
MCHC: 30.6 g/dL (ref 30.0–36.0)
MCV: 107.6 fL — ABNORMAL HIGH (ref 78.0–100.0)
PLATELETS: 50 10*3/uL — AB (ref 150–400)
RBC: 2.88 MIL/uL — ABNORMAL LOW (ref 4.22–5.81)
RDW: 17.2 % — AB (ref 11.5–15.5)
WBC: 5.8 10*3/uL (ref 4.0–10.5)

## 2015-08-28 LAB — I-STAT TROPONIN, ED: TROPONIN I, POC: 0.04 ng/mL (ref 0.00–0.08)

## 2015-08-28 LAB — MRSA PCR SCREENING: MRSA by PCR: NEGATIVE

## 2015-08-28 LAB — CBG MONITORING, ED: GLUCOSE-CAPILLARY: 87 mg/dL (ref 65–99)

## 2015-08-28 LAB — LACTIC ACID, PLASMA
LACTIC ACID, VENOUS: 1.9 mmol/L (ref 0.5–1.9)
LACTIC ACID, VENOUS: 1.8 mmol/L (ref 0.5–1.9)
Lactic Acid, Venous: 1.7 mmol/L (ref 0.5–1.9)

## 2015-08-28 LAB — PROTIME-INR
INR: 1.29
PROTHROMBIN TIME: 16.2 s — AB (ref 11.4–15.2)

## 2015-08-28 LAB — OCCULT BLOOD, POC DEVICE: FECAL OCCULT BLD: NEGATIVE

## 2015-08-28 LAB — PROCALCITONIN: PROCALCITONIN: 14.64 ng/mL

## 2015-08-28 LAB — I-STAT CG4 LACTIC ACID, ED: LACTIC ACID, VENOUS: 1.73 mmol/L (ref 0.5–1.9)

## 2015-08-28 MED ORDER — DEXTROSE 5 % IV SOLN
500.0000 mg | INTRAVENOUS | Status: AC
  Administered 2015-08-28 – 2015-09-01 (×5): 500 mg via INTRAVENOUS
  Filled 2015-08-28 (×5): qty 500

## 2015-08-28 MED ORDER — CEFEPIME HCL 2 G IJ SOLR
2.0000 g | Freq: Once | INTRAMUSCULAR | Status: DC
  Administered 2015-08-28: 2 g via INTRAVENOUS
  Filled 2015-08-28: qty 2

## 2015-08-28 MED ORDER — SERTRALINE HCL 50 MG PO TABS
25.0000 mg | ORAL_TABLET | Freq: Every day | ORAL | Status: DC
  Administered 2015-08-29 – 2015-09-12 (×14): 25 mg via ORAL
  Filled 2015-08-28 (×16): qty 1

## 2015-08-28 MED ORDER — SODIUM CHLORIDE 0.9 % IV SOLN: 250.0000 mL | INTRAVENOUS | Status: DC | PRN

## 2015-08-28 MED ORDER — PHENYLEPHRINE HCL 10 MG/ML IJ SOLN
25.0000 ug/min | INTRAVENOUS | Status: DC
  Administered 2015-08-28: 100 ug/min via INTRAVENOUS
  Administered 2015-08-29 (×2): 160 ug/min via INTRAVENOUS
  Administered 2015-08-29: 180 ug/min via INTRAVENOUS
  Administered 2015-08-29: 100 ug/min via INTRAVENOUS
  Filled 2015-08-28 (×8): qty 4

## 2015-08-28 MED ORDER — DEXTROSE 5 % IV SOLN: 1.0000 g | INTRAVENOUS | Status: DC

## 2015-08-28 MED ORDER — LAMOTRIGINE 25 MG PO TABS
50.0000 mg | ORAL_TABLET | Freq: Every day | ORAL | Status: DC
  Administered 2015-08-29 – 2015-09-12 (×15): 50 mg via ORAL
  Filled 2015-08-28 (×15): qty 2

## 2015-08-28 MED ORDER — CETYLPYRIDINIUM CHLORIDE 0.05 % MT LIQD
7.0000 mL | Freq: Two times a day (BID) | OROMUCOSAL | Status: DC
  Administered 2015-08-29 – 2015-09-11 (×22): 7 mL via OROMUCOSAL

## 2015-08-28 MED ORDER — PHENYLEPHRINE HCL 10 MG/ML IJ SOLN
25.0000 ug/min | INTRAVENOUS | Status: DC
  Administered 2015-08-28: 25 ug/min via INTRAVENOUS
  Filled 2015-08-28 (×2): qty 1

## 2015-08-28 MED ORDER — SODIUM CHLORIDE 0.9 % IV SOLN: INTRAVENOUS | Status: DC | PRN

## 2015-08-28 MED ORDER — SODIUM CHLORIDE 0.9 % IV BOLUS (SEPSIS)
500.0000 mL | Freq: Once | INTRAVENOUS | Status: AC
  Administered 2015-08-28: 500 mL via INTRAVENOUS

## 2015-08-28 MED ORDER — VANCOMYCIN HCL 10 G IV SOLR
1500.0000 mg | Freq: Once | INTRAVENOUS | Status: DC
  Filled 2015-08-28: qty 1500

## 2015-08-28 MED ORDER — CHLORHEXIDINE GLUCONATE 0.12 % MT SOLN
15.0000 mL | Freq: Two times a day (BID) | OROMUCOSAL | Status: DC
  Administered 2015-08-28 – 2015-09-12 (×29): 15 mL via OROMUCOSAL
  Filled 2015-08-28 (×20): qty 15

## 2015-08-28 MED ORDER — LAMOTRIGINE 100 MG PO TABS
100.0000 mg | ORAL_TABLET | Freq: Every day | ORAL | Status: DC
  Administered 2015-08-28 – 2015-09-11 (×14): 100 mg via ORAL
  Filled 2015-08-28 (×17): qty 1

## 2015-08-28 MED ORDER — VANCOMYCIN HCL IN DEXTROSE 1-5 GM/200ML-% IV SOLN: 1000.0000 mg | Freq: Once | INTRAVENOUS | Status: DC

## 2015-08-28 MED ORDER — CETYLPYRIDINIUM CHLORIDE 0.05 % MT LIQD: 7.0000 mL | Freq: Two times a day (BID) | OROMUCOSAL | Status: DC

## 2015-08-28 MED ORDER — SODIUM CHLORIDE 0.9 % IV BOLUS (SEPSIS)
1000.0000 mL | Freq: Once | INTRAVENOUS | Status: AC
  Administered 2015-08-28: 1000 mL via INTRAVENOUS

## 2015-08-28 MED ORDER — LEVOTHYROXINE SODIUM 75 MCG PO TABS
75.0000 ug | ORAL_TABLET | Freq: Every day | ORAL | Status: DC
  Administered 2015-08-29 – 2015-09-12 (×15): 75 ug via ORAL
  Filled 2015-08-28 (×16): qty 1

## 2015-08-28 MED ORDER — SODIUM CHLORIDE 0.9 % IV SOLN: INTRAVENOUS | Status: DC

## 2015-08-28 NOTE — ED Notes (Signed)
MD made aware of patient's BP  

## 2015-08-28 NOTE — Progress Notes (Signed)
Based on current ABG, pt taken off NRB and placed on 6L [place]. RN made aware. RT will continue to monitor.

## 2015-08-28 NOTE — Procedures (Signed)
Arterial Catheter Insertion Procedure Note EYASU JILES [id number] July 07, 1926  Procedure: Insertion of Arterial Catheter  Indications: Blood pressure monitoring  Procedure Details Consent: Risks of procedure as well as the alternatives and risks of each were explained to the (patient/caregiver).  Consent for procedure obtained. Time Out: Verified patient identification, verified procedure, site/side was marked, verified correct patient position, special equipment/implants available, medications/allergies/relevent history reviewed, required imaging and test results available.  Performed  Maximum sterile technique was used including antiseptics, cap, gloves, gown, hand hygiene, mask and sheet. Skin prep: Chlorhexidine; local anesthetic administered 20 gauge catheter was inserted into right radial artery using the Seldinger technique.  Evaluation Blood flow good; BP tracing good. Complications: No apparent complications.   Jake Samples Jaana Brodt 08/28/2015

## 2015-08-28 NOTE — ED Notes (Signed)
Nephrology at the bedside.

## 2015-08-28 NOTE — ED Notes (Signed)
Pt placed on NRB after no response to [place].

## 2015-08-28 NOTE — ED Provider Notes (Signed)
[facility] DEPT Provider Note   CSN: [id number] Arrival date & time: 08/28/15  [id number]  First Provider Contact:  First MD Initiated Contact with Patient 08/28/15 [phone]        History   Chief Complaint Chief Complaint  Patient presents with  . Fall    HPI [name] is a 82 y.o. male.  HPI Patient presents to the emergency room with complaints of weakness and a fall this morning. Patient has a history of multiple medical problems including chronic renal failure as well as Guillian Barr syndrome and frequent falls.  Patient states he went to get up this morning and fell. She states he felt weak all over. Now complains of soreness all over and an abrasion of his right second toe. Denies any recent fevers or cough. No trouble with any chest pain or abdominal pain. No vomiting or diarrhea. Patient usually gets dialysis Monday Wednesday and Friday. He was due to go this morning.  He denies any headache or neck pain. No back pain    Past Medical History:  Diagnosis Date  . Acute infective polyneuritis ([place])   . Anemia    of chronic disease  . Aortic stenosis   . Atrial fibrillation ([place])   . Benign prostatic hypertrophy   . Colon cancer ([place])   . Colon polyp    adenomatous  . Dementia   . Depression   . Diverticulosis   . Encounter for long-term (current) use of other medications   . End stage renal disease ([place])   . Gout   . Hearing loss   . Hernia of unspecified site of abdominal cavity without mention of obstruction or gangrene    hiatal  . History of Clostridium difficile infection   . History of Guillain-Barre syndrome   . Hyperlipidemia   . Hyperparathyroidism   . Hyperparathyroidism ([place])   . Hypertension   . Hypothyroidism   . Internal hemorrhoids   . Pancytopenia ([place])   . Polyneuropathy in other diseases classified elsewhere ([place])   . Proctitis   . Renal failure   . Seizures ([place])   . Unspecified hereditary and idiopathic peripheral neuropathy   .  Unspecified transient cerebral ischemia     Patient Active Problem List   Diagnosis Date Noted  . Dyspnea 02/21/2015  . Pleural effusion on right 02/14/2015  . Poor dentition 01/18/2015  . Pleural effusion on left 01/17/2015  . Hypotension, unspecified 04/02/2013  . Fever, unspecified 04/02/2013  . Acute respiratory failure ([place]) 04/02/2013  . CAP (community acquired pneumonia) 04/02/2013  . Seizures ([place]) 04/02/2013  . Sepsis ([place]) 04/02/2013  . Encounter for long-term (current) use of other medications   . Hereditary and idiopathic peripheral neuropathy   . Unspecified transient cerebral ischemia   . Polyneuropathy in other diseases classified elsewhere ([place])   . External hemorrhoids 10/30/2010  . HYPOTHYROIDISM 08/14/2009  . HYPERPARATHYROIDISM, SECONDARY 08/14/2009  . HYPERLIPIDEMIA 08/14/2009  . GOUT, UNSPECIFIED 08/14/2009  . ANEMIA OF CHRONIC DISEASE 08/14/2009  . HYPERTENSION 08/14/2009  . Aortic valve disorder 08/14/2009  . HEMORRHOIDS, INTERNAL 08/14/2009  . HIATAL HERNIA 08/14/2009  . DIVERTICULOSIS, COLON 08/14/2009  . PROCTITIS 08/14/2009  . RENAL FAILURE, END STAGE 08/14/2009  . COLONIC POLYPS, ADENOMATOUS, HX OF 08/14/2009  . BENIGN PROSTATIC HYPERTROPHY, HX OF 08/14/2009    Past Surgical History:  Procedure Laterality Date  . KIDNEY TRANSPLANT     1998  . SKIN CANCER EXCISION         Home Medications  Prior to Admission medications   Medication Sig Start Date End Date Taking? Authorizing Provider  allopurinol (ZYLOPRIM) 100 MG tablet Take 100 mg by mouth daily. 06/22/12  Yes Historical Provider, MD  aspirin 81 MG tablet Take 81 mg by mouth daily.     Yes Historical Provider, MD  B Complex-C-Folic Acid (DIALYVITE TABLET) TABS Take 1 tablet by mouth daily.  08/11/12  Yes Historical Provider, MD  calcium carbonate (TUMS - DOSED IN MG ELEMENTAL CALCIUM) 500 MG chewable tablet Chew 1 tablet by mouth 2 (two) times daily with a meal.    Yes Historical  Provider, MD  carvedilol (COREG) 25 MG tablet Take 25 mg by mouth at bedtime.    Yes Historical Provider, MD  Cholecalciferol (VITAMIN D-3 PO) Take 2 tablets by mouth every morning.    Yes Historical Provider, MD  folic acid (FOLVITE) 1 MG tablet Take 1 mg by mouth daily.     Yes Historical Provider, MD  lamoTRIgine (LAMICTAL) 100 MG tablet TAKE 1/2 TABLET BY MOUTH EVERY MORNING AND 1 TABLET AT BEDTIME 08/08/15  Yes Ward Givens, NP  latanoprost (XALATAN) 0.005 % ophthalmic solution Place 1 drop into both eyes at bedtime.  07/18/14  Yes Historical Provider, MD  levothyroxine (SYNTHROID, LEVOTHROID) 75 MCG tablet Take 75 mcg by mouth daily before breakfast.   Yes Historical Provider, MD  mirtazapine (REMERON) 30 MG tablet Take 1 tablet (30 mg total) by mouth at bedtime. 08/08/15  Yes Ward Givens, NP  omeprazole (PRILOSEC) 20 MG capsule Take 20 mg by mouth daily. 07/18/12  Yes Historical Provider, MD  sertraline (ZOLOFT) 25 MG tablet Take 1 tablet (25 mg total) by mouth daily. 08/08/15  Yes Ward Givens, NP  simvastatin (ZOCOR) 20 MG tablet Take 20 mg by mouth at bedtime.     Yes Historical Provider, MD  timolol (BETIMOL) 0.5 % ophthalmic solution Place 1 drop into both eyes daily.   Yes Historical Provider, MD    Family History Family History  Problem Relation Age of Onset  . Stroke Father   . Diabetes Brother   . Liver disease Son   . Colon cancer Neg Hx     Social History Social History  Substance Use Topics  . Smoking status: Former Smoker    Packs/day: 3.00    Years: 40.00    Types: Cigarettes    Quit date: 10/29/1960  . Smokeless tobacco: Never Used  . Alcohol use No     Allergies   Review of patient's allergies indicates no known allergies.   Review of Systems Review of Systems  All other systems reviewed and are negative.    Physical Exam Updated Vital Signs BP (!) 75/53   Pulse 120   Temp 97.9 F (36.6 C) (Oral)   Resp 15   Ht 5' 8.5" (1.74 m)   Wt 64.4  kg   SpO2 100%   BMI 21.28 kg/m   Physical Exam  Constitutional: No distress.  Elderly , frail  HENT:  Head: Normocephalic and atraumatic.  Right Ear: External ear normal.  Left Ear: External ear normal.  Eyes: Conjunctivae are normal. Right eye exhibits no discharge. Left eye exhibits no discharge. No scleral icterus.  Neck: Neck supple. No tracheal deviation present.  Cardiovascular: Regular rhythm and intact distal pulses.  Tachycardia present.   Pulmonary/Chest: Effort normal and breath sounds normal. No stridor. No respiratory distress. He has no wheezes. He has no rales.  Abdominal: Soft. Bowel sounds are normal. He exhibits no  distension. There is no tenderness. There is no rebound and no guarding. A hernia is present. Hernia confirmed positive in the ventral area (right lower quadrant, soft).  Musculoskeletal: He exhibits no edema or tenderness.       Right foot: There is no deformity.  Abrasion second toe, bilateral edema  Lymphadenopathy:    He has no cervical adenopathy.  Neurological: He is alert. No cranial nerve deficit (no facial droop, extraocular movements intact, no slurred speech) or sensory deficit. He exhibits normal muscle tone. He displays no seizure activity.  Generalized weakness, able to grip with both hands, move both feet equally, difficulty lifting legs off the bed, sensation intact throughout  Skin: Skin is warm and dry. No rash noted. He is not diaphoretic.  Psychiatric: He has a normal mood and affect.  Nursing note and vitals reviewed.    ED Treatments / Results  Labs (all labs ordered are listed, but only abnormal results are displayed) Labs Reviewed  CBC - Abnormal; Notable for the following:       Result Value   RBC 2.88 (*)    Hemoglobin 9.5 (*)    HCT 31.0 (*)    MCV 107.6 (*)    RDW 17.2 (*)    Platelets 50 (*)    All other components within normal limits  DIFFERENTIAL - Abnormal; Notable for the following:    Lymphs Abs 0.5 (*)     All other components within normal limits  COMPREHENSIVE METABOLIC PANEL - Abnormal; Notable for the following:    Potassium 3.3 (*)    Chloride 98 (*)    BUN 28 (*)    Creatinine, Ser 5.20 (*)    Calcium 8.7 (*)    Total Protein 5.7 (*)    Albumin 2.8 (*)    AST 133 (*)    Alkaline Phosphatase 332 (*)    Total Bilirubin 3.7 (*)    GFR calc non Af Amer 9 (*)    GFR calc Af Amer 10 (*)    All other components within normal limits  PROTIME-INR - Abnormal; Notable for the following:    Prothrombin Time 16.2 (*)    All other components within normal limits  I-STAT CHEM 8, ED - Abnormal; Notable for the following:    Potassium 3.3 (*)    Chloride 94 (*)    BUN 28 (*)    Creatinine, Ser 5.00 (*)    Calcium, Ion 1.03 (*)    Hemoglobin 10.9 (*)    HCT 32.0 (*)    All other components within normal limits  I-STAT ARTERIAL BLOOD GAS, ED - Abnormal; Notable for the following:    pH, Arterial 7.530 (*)    pO2, Arterial 265.0 (*)    Bicarbonate 32.0 (*)    Acid-Base Excess 9.0 (*)    All other components within normal limits  CULTURE, BLOOD (ROUTINE X 2)  CULTURE, BLOOD (ROUTINE X 2)  URINE CULTURE  URINALYSIS, ROUTINE W REFLEX MICROSCOPIC (NOT AT [facility])  I-STAT TROPOININ, ED  CBG MONITORING, ED  I-STAT CG4 LACTIC ACID, ED    EKG  EKG Interpretation  Date/Time:  Monday August 28 2015 08:53:21 EDT Ventricular Rate:  90 PR Interval:    QRS Duration: 95 QT Interval:  359 QTC Calculation: 440 R Axis:   162 Text Interpretation:  Atrial fibrillation Anteroseptal infarct, age indeterminate Since last tracing rate slower decreased qrs amplitude Confirmed by Maleka Contino  MD-J, Jaystin Mcgarvey [id number]) on 08/28/2015 8:58:11 AM Also confirmed by [id number]  MD-J, Antony Sian [phone]), editor [place] CT, Leda Gauze [phone])  on 08/28/2015 9:20:31 AM       Radiology Ct Head Wo Contrast  Result Date: 08/28/2015 CLINICAL DATA:  Several recent falls. Chronic renal failure. Lethargy. EXAM: CT HEAD WITHOUT CONTRAST CT CERVICAL  SPINE WITHOUT CONTRAST TECHNIQUE: Multidetector CT imaging of the head and cervical spine was performed following the standard protocol without intravenous contrast. Multiplanar CT image reconstructions of the cervical spine were also generated. COMPARISON:  Head CT April 02, 2013 FINDINGS: CT HEAD FINDINGS There is mild diffuse atrophy which is stable. There is no intracranial mass, hemorrhage, extra-axial fluid collection, or midline shift. There is mild patchy small vessel disease in the centra semiovale bilaterally. There are scattered punctate basal ganglia lacunar type infarcts, stable. There is been a prior small infarct in the anterior limb of the right external capsule, stable. No acute infarct evident. Bony calvarium appears intact. Bones are osteoporotic, particularly in the medial sphenoid bone regions, stable. Mastoid air cells are clear. There is no hyperdense vessel. There is calcification in the carotid siphon and cavernous carotid artery regions. There is a retention cyst in the left maxillary antrum. Other paranasal sinuses are clear. Orbits appear symmetric bilaterally. CT CERVICAL SPINE FINDINGS There is no demonstrable acute fracture. There is just over 3 mm of retrolisthesis of C4 on C5. There is no other spondylolisthesis. Prevertebral soft tissues and predental space regions are normal. There is severe disc space narrowing at C3-4, C4-5, C5-6, and C6-7. There is moderate disc space narrowing at C7-T1. There is facet hypertrophy at multiple levels bilaterally. There is carotid artery calcification bilaterally. There is a sizable pleural effusion on the left. Calcification is noted in visualized innominate arteries. IMPRESSION: CT head: Atrophy with prior small basal ganglia region lacunar type infarcts and mild periventricular small vessel disease. No hemorrhage or acute infarct evident. No mass or midline shift. There are foci of arterial vascular calcification. There is a retention cyst in  the left maxillary antrum. Bones somewhat osteoporotic. CT cervical spine: No fracture. Mild spondylolisthesis at C4-5, felt to be due to underlying spondylosis. Multilevel arthropathy. Calcification in multiple vessels including both carotid arteries. Sizable right pleural effusion noted. Electronically Signed   By: Lowella Grip III M.D.   On: 08/28/2015 10:01  Ct Cervical Spine Wo Contrast  Result Date: 08/28/2015 CLINICAL DATA:  Several recent falls. Chronic renal failure. Lethargy. EXAM: CT HEAD WITHOUT CONTRAST CT CERVICAL SPINE WITHOUT CONTRAST TECHNIQUE: Multidetector CT imaging of the head and cervical spine was performed following the standard protocol without intravenous contrast. Multiplanar CT image reconstructions of the cervical spine were also generated. COMPARISON:  Head CT April 02, 2013 FINDINGS: CT HEAD FINDINGS There is mild diffuse atrophy which is stable. There is no intracranial mass, hemorrhage, extra-axial fluid collection, or midline shift. There is mild patchy small vessel disease in the centra semiovale bilaterally. There are scattered punctate basal ganglia lacunar type infarcts, stable. There is been a prior small infarct in the anterior limb of the right external capsule, stable. No acute infarct evident. Bony calvarium appears intact. Bones are osteoporotic, particularly in the medial sphenoid bone regions, stable. Mastoid air cells are clear. There is no hyperdense vessel. There is calcification in the carotid siphon and cavernous carotid artery regions. There is a retention cyst in the left maxillary antrum. Other paranasal sinuses are clear. Orbits appear symmetric bilaterally. CT CERVICAL SPINE FINDINGS There is no demonstrable acute fracture. There is just over 3 mm of retrolisthesis  of C4 on C5. There is no other spondylolisthesis. Prevertebral soft tissues and predental space regions are normal. There is severe disc space narrowing at C3-4, C4-5, C5-6, and C6-7. There  is moderate disc space narrowing at C7-T1. There is facet hypertrophy at multiple levels bilaterally. There is carotid artery calcification bilaterally. There is a sizable pleural effusion on the left. Calcification is noted in visualized innominate arteries. IMPRESSION: CT head: Atrophy with prior small basal ganglia region lacunar type infarcts and mild periventricular small vessel disease. No hemorrhage or acute infarct evident. No mass or midline shift. There are foci of arterial vascular calcification. There is a retention cyst in the left maxillary antrum. Bones somewhat osteoporotic. CT cervical spine: No fracture. Mild spondylolisthesis at C4-5, felt to be due to underlying spondylosis. Multilevel arthropathy. Calcification in multiple vessels including both carotid arteries. Sizable right pleural effusion noted. Electronically Signed   By: Lowella Grip III M.D.   On: 08/28/2015 10:01  Dg Chest Port 1 View  Result Date: 08/28/2015 CLINICAL DATA:  Shortness of breath and cough EXAM: PORTABLE CHEST 1 VIEW COMPARISON:  March 09, 2015 FINDINGS: Prominent skin folds are seen over the right upper and lateral chest. No pneumothorax. A moderate left pleural effusion and underlying opacity is stable. A new right effusion is noted. There is also opacity throughout the right mid and lower lung. No other interval changes. IMPRESSION: 1. Stable left moderate-sized pleural effusion and underlying opacity. 2. New right pleural effusion and underlying opacity. More diffuse opacity seen throughout the right mid and lower lungs suggestive of infiltrate. Recommend follow-up to resolution. Electronically Signed   By: Dorise Bullion III M.D   On: 08/28/2015 10:08  Dg Foot Complete Right  Result Date: 08/28/2015 CLINICAL DATA:  Chronic right foot numbness. EXAM: RIGHT FOOT COMPLETE - 3+ VIEW COMPARISON:  None. FINDINGS: There is no evidence of fracture or dislocation. There is no evidence of arthropathy or other  focal bone abnormality. Vascular calcifications are noted. IMPRESSION: No acute abnormality seen in the right foot. Electronically Signed   By: Marijo Conception, M.D.   On: 08/28/2015 10:10   Procedures .Critical Care Performed by: Dorie Rank Authorized by: Dorie Rank   Critical care provider statement:    Critical care time (minutes):  35   Critical care was necessary to treat or prevent imminent or life-threatening deterioration of the following conditions:  Circulatory failure and sepsis   Critical care was time spent personally by me on the following activities:  Blood draw for specimens, discussions with consultants, evaluation of patient's response to treatment, examination of patient, ordering and performing treatments and interventions, ordering and review of laboratory studies, ordering and review of radiographic studies, pulse oximetry and re-evaluation of patient's condition    (including critical care time)  Medications Ordered in ED Medications  sodium chloride 0.9 % bolus 1,000 mL (not administered)    And  sodium chloride 0.9 % bolus 1,000 mL (not administered)  ceFEPIme (MAXIPIME) 2 g in dextrose 5 % 50 mL IVPB (not administered)  vancomycin (VANCOCIN) IVPB 1000 mg/200 mL premix (not administered)  sodium chloride 0.9 % bolus 500 mL (0 mLs Intravenous Stopped 08/28/15 1006)     Initial Impression / Assessment and Plan / ED Course  I have reviewed the triage vital signs and the nursing notes.  Pertinent labs & imaging results that were available during my care of the patient were reviewed by me and considered in my medical decision making (see chart  for details).  Clinical Course  Value Comment By Time   Notified of decreased o2 sat.  Reading in the 48s.  Pt is not tachypneic.  Breathing is not labored.  Pt denied feeling short of breath to me. No diaphoresis or chest pain.  I am not sure the pulse ox is accurate.  Will check an ABG.  Continue non re-breather oxygen.   Monitor closely. Dorie Rank, MD 07/31 [phone]  Hemoglobin: (!) 10.9 Stable compared to previous  Dorie Rank, MD 07/31 [phone]  Creatinine: (!) 5.00 Hx of CRF Dorie Rank, MD 07/31 0850   Pulse ox reading in the 90s now.  Po2 is 265.  Previous low sat was inaccurate Dorie Rank, MD 07/31 [phone]   Patient's blood pressure has not improved.White blood cell count is not elevated and the patient is afebrile howeverthe chest x-ray does show possible pneumonia.possible that he has sepsis as a cause of his hypotension. His hemoglobin is slightly decreased from previous values. I will aspirate a Hemoccult. I will consult with critical care for admission. IV fluid boluses has been ordered. Empiric antibiotic  ordered.   Final Clinical Impressions(s) / ED Diagnoses   Final diagnoses:  Pain  Healthcare-associated pneumonia  Sepsis, due to unspecified organism [facility])  Chronic renal failure, unspecified stage      Dorie Rank, MD 08/29/15 1521

## 2015-08-28 NOTE — Consult Note (Signed)
Reason for Consult: To manage dialysis and dialysis related needs Referring Physician: [name] is an 82 y.o. male with multiple medical problems including AS, Afib, HTN, hx Guillain-Barre, sz, memory loss followed by neurology as well as longstanding ESRD- HD MWF at the [facility] unit.  He presents for his HD treatments and in spite of his advanced age and many medical problems he has had only a few hospital admits.  He is brought in today with complaints of weakness and a fall at home this AM.  Blood pressure which is usual to run in the 90's with occasional 80's at the OP HD unit ran low in the ER- abnormal chest imaging raises the issue of PNA and possible sepsis so is being admitted.  Pt seems to recognize me- can give me his date of birth and denies any complaints- a little difficult to understand at times.  He tells the CCM docs no CPR or intubation but he would accept other forms of support   Dialyzes at [facility] MWF- 3 hours and 15 min EDW 64- getting close as OP. HD Bath 2K/2.25 ca, Dialyzer 160, Heparin no. Access AVF. 450 BFR- venofer 50 weekly, mircera 75 q 2 weeks- given 7/19- last hgb 10/4  On TUMS for binding and calcitriol 0.25- last ca=9.7, phos 3.6, PTH 77  Past Medical History:  Diagnosis Date  . Acute infective polyneuritis ([place])   . Anemia    of chronic disease  . Aortic stenosis   . Atrial fibrillation ([place])   . Benign prostatic hypertrophy   . Colon cancer ([place])   . Colon polyp    adenomatous  . Dementia   . Depression   . Diverticulosis   . Encounter for long-term (current) use of other medications   . End stage renal disease ([place])   . Gout   . Hearing loss   . Hernia of unspecified site of abdominal cavity without mention of obstruction or gangrene    hiatal  . History of Clostridium difficile infection   . History of Guillain-Barre syndrome   . Hyperlipidemia   . Hyperparathyroidism   . Hyperparathyroidism ([place])   . Hypertension   .  Hypothyroidism   . Internal hemorrhoids   . Pancytopenia ([place])   . Polyneuropathy in other diseases classified elsewhere ([place])   . Proctitis   . Renal failure   . Seizures ([place])   . Unspecified hereditary and idiopathic peripheral neuropathy   . Unspecified transient cerebral ischemia     Past Surgical History:  Procedure Laterality Date  . KIDNEY TRANSPLANT     1998  . SKIN CANCER EXCISION      Family History  Problem Relation Age of Onset  . Stroke Father   . Diabetes Brother   . Liver disease Son   . Colon cancer Neg Hx     Social History:  reports that he quit smoking about 54 years ago. His smoking use included Cigarettes. He has a 120.00 pack-year smoking history. He has never used smokeless tobacco. He reports that he does not drink alcohol or use drugs.  Allergies: No Known Allergies  Medications: I have reviewed the patient's current medications.   Results for orders placed or performed during the hospital encounter of 08/28/15 (from the past 48 hour(s))  CBC     Status: Abnormal   Collection Time: 08/28/15  8:07 AM  Result Value Ref Range   WBC 5.8 4.0 - 10.5 K/uL   RBC 2.88 (L)  4.22 - 5.81 MIL/uL   Hemoglobin 9.5 (L) 13.0 - 17.0 g/dL    Comment: REPEATED TO VERIFY   HCT 31.0 (L) 39.0 - 52.0 %   MCV 107.6 (H) 78.0 - 100.0 fL   MCH 33.0 26.0 - 34.0 pg   MCHC 30.6 30.0 - 36.0 g/dL   RDW 17.2 (H) 11.5 - 15.5 %   Platelets 50 (L) 150 - 400 K/uL    Comment: REPEATED TO VERIFY PLATELET COUNT CONFIRMED BY SMEAR   Differential     Status: Abnormal   Collection Time: 08/28/15  8:07 AM  Result Value Ref Range   Neutrophils Relative % 86 %   Neutro Abs 5.0 1.7 - 7.7 K/uL   Lymphocytes Relative 8 %   Lymphs Abs 0.5 (L) 0.7 - 4.0 K/uL   Monocytes Relative 5 %   Monocytes Absolute 0.3 0.1 - 1.0 K/uL   Eosinophils Relative 0 %   Eosinophils Absolute 0.0 0.0 - 0.7 K/uL   Basophils Relative 0 %   Basophils Absolute 0.0 0.0 - 0.1 K/uL  Comprehensive metabolic  panel     Status: Abnormal   Collection Time: 08/28/15  8:07 AM  Result Value Ref Range   Sodium 136 135 - 145 mmol/L   Potassium 3.3 (L) 3.5 - 5.1 mmol/L   Chloride 98 (L) 101 - 111 mmol/L   CO2 28 22 - 32 mmol/L   Glucose, Bld 88 65 - 99 mg/dL   BUN 28 (H) 6 - 20 mg/dL   Creatinine, Ser 5.20 (H) 0.61 - 1.24 mg/dL   Calcium 8.7 (L) 8.9 - 10.3 mg/dL   Total Protein 5.7 (L) 6.5 - 8.1 g/dL   Albumin 2.8 (L) 3.5 - 5.0 g/dL   AST 133 (H) 15 - 41 U/L   ALT 38 17 - 63 U/L   Alkaline Phosphatase 332 (H) 38 - 126 U/L   Total Bilirubin 3.7 (H) 0.3 - 1.2 mg/dL   GFR calc non Af Amer 9 (L) >60 mL/min   GFR calc Af Amer 10 (L) >60 mL/min    Comment: (NOTE) The eGFR has been calculated using the CKD EPI equation. This calculation has not been validated in all clinical situations. eGFR's persistently <60 mL/min signify possible Chronic Kidney Disease.    Anion gap 10 5 - 15  Protime-INR     Status: Abnormal   Collection Time: 08/28/15  8:07 AM  Result Value Ref Range   Prothrombin Time 16.2 (H) 11.4 - 15.2 seconds   INR 1.29   I-stat troponin, ED (not at [facility], [facility])     Status: None   Collection Time: 08/28/15  8:33 AM  Result Value Ref Range   Troponin i, poc 0.04 0.00 - 0.08 ng/mL   Comment 3            Comment: Due to the release kinetics of cTnI, a negative result within the first hours of the onset of symptoms does not rule out myocardial infarction with certainty. If myocardial infarction is still suspected, repeat the test at appropriate intervals.   I-Stat Chem 8, ED  (not at [facility], [facility])     Status: Abnormal   Collection Time: 08/28/15  8:35 AM  Result Value Ref Range   Sodium 138 135 - 145 mmol/L   Potassium 3.3 (L) 3.5 - 5.1 mmol/L   Chloride 94 (L) 101 - 111 mmol/L   BUN 28 (H) 6 - 20 mg/dL   Creatinine, Ser 5.00 (H) 0.61 - 1.24  mg/dL   Glucose, Bld 81 65 - 99 mg/dL   Calcium, Ion 1.03 (L) 1.12 - 1.23 mmol/L   TCO2 29 0 - 100 mmol/L   Hemoglobin 10.9 (L) 13.0 - 17.0  g/dL   HCT 32.0 (L) 39.0 - 52.0 %  I-Stat Arterial Blood Gas, ED - (order at [facility] and [facility] only)     Status: Abnormal   Collection Time: 08/28/15  8:58 AM  Result Value Ref Range   pH, Arterial 7.530 (H) 7.350 - 7.450   pCO2 arterial 38.2 35.0 - 45.0 mmHg   pO2, Arterial 265.0 (H) 80.0 - 100.0 mmHg   Bicarbonate 32.0 (H) 20.0 - 24.0 mEq/L   TCO2 33 0 - 100 mmol/L   O2 Saturation 100.0 %   Acid-Base Excess 9.0 (H) 0.0 - 2.0 mmol/L   Patient temperature 98.5 F    Collection site RADIAL, ALLEN'S TEST ACCEPTABLE    Drawn by RT    Sample type ARTERIAL   CBG monitoring, ED     Status: None   Collection Time: 08/28/15  9:02 AM  Result Value Ref Range   Glucose-Capillary 87 65 - 99 mg/dL  Lactic acid, plasma     Status: None   Collection Time: 08/28/15 12:53 PM  Result Value Ref Range   Lactic Acid, Venous 1.7 0.5 - 1.9 mmol/L  I-Stat CG4 Lactic Acid, ED     Status: None   Collection Time: 08/28/15 12:59 PM  Result Value Ref Range   Lactic Acid, Venous 1.73 0.5 - 1.9 mmol/L    Ct Head Wo Contrast  Result Date: 08/28/2015 CLINICAL DATA:  Several recent falls. Chronic renal failure. Lethargy. EXAM: CT HEAD WITHOUT CONTRAST CT CERVICAL SPINE WITHOUT CONTRAST TECHNIQUE: Multidetector CT imaging of the head and cervical spine was performed following the standard protocol without intravenous contrast. Multiplanar CT image reconstructions of the cervical spine were also generated. COMPARISON:  Head CT April 02, 2013 FINDINGS: CT HEAD FINDINGS There is mild diffuse atrophy which is stable. There is no intracranial mass, hemorrhage, extra-axial fluid collection, or midline shift. There is mild patchy small vessel disease in the centra semiovale bilaterally. There are scattered punctate basal ganglia lacunar type infarcts, stable. There is been a prior small infarct in the anterior limb of the right external capsule, stable. No acute infarct evident. Bony calvarium appears intact. Bones are osteoporotic,  particularly in the medial sphenoid bone regions, stable. Mastoid air cells are clear. There is no hyperdense vessel. There is calcification in the carotid siphon and cavernous carotid artery regions. There is a retention cyst in the left maxillary antrum. Other paranasal sinuses are clear. Orbits appear symmetric bilaterally. CT CERVICAL SPINE FINDINGS There is no demonstrable acute fracture. There is just over 3 mm of retrolisthesis of C4 on C5. There is no other spondylolisthesis. Prevertebral soft tissues and predental space regions are normal. There is severe disc space narrowing at C3-4, C4-5, C5-6, and C6-7. There is moderate disc space narrowing at C7-T1. There is facet hypertrophy at multiple levels bilaterally. There is carotid artery calcification bilaterally. There is a sizable pleural effusion on the left. Calcification is noted in visualized innominate arteries. IMPRESSION: CT head: Atrophy with prior small basal ganglia region lacunar type infarcts and mild periventricular small vessel disease. No hemorrhage or acute infarct evident. No mass or midline shift. There are foci of arterial vascular calcification. There is a retention cyst in the left maxillary antrum. Bones somewhat osteoporotic. CT cervical spine: No fracture. Mild spondylolisthesis  at C4-5, felt to be due to underlying spondylosis. Multilevel arthropathy. Calcification in multiple vessels including both carotid arteries. Sizable right pleural effusion noted. Electronically Signed   By: Lowella Grip III M.D.   On: 08/28/2015 10:01  Ct Cervical Spine Wo Contrast  Result Date: 08/28/2015 CLINICAL DATA:  Several recent falls. Chronic renal failure. Lethargy. EXAM: CT HEAD WITHOUT CONTRAST CT CERVICAL SPINE WITHOUT CONTRAST TECHNIQUE: Multidetector CT imaging of the head and cervical spine was performed following the standard protocol without intravenous contrast. Multiplanar CT image reconstructions of the cervical spine were also  generated. COMPARISON:  Head CT April 02, 2013 FINDINGS: CT HEAD FINDINGS There is mild diffuse atrophy which is stable. There is no intracranial mass, hemorrhage, extra-axial fluid collection, or midline shift. There is mild patchy small vessel disease in the centra semiovale bilaterally. There are scattered punctate basal ganglia lacunar type infarcts, stable. There is been a prior small infarct in the anterior limb of the right external capsule, stable. No acute infarct evident. Bony calvarium appears intact. Bones are osteoporotic, particularly in the medial sphenoid bone regions, stable. Mastoid air cells are clear. There is no hyperdense vessel. There is calcification in the carotid siphon and cavernous carotid artery regions. There is a retention cyst in the left maxillary antrum. Other paranasal sinuses are clear. Orbits appear symmetric bilaterally. CT CERVICAL SPINE FINDINGS There is no demonstrable acute fracture. There is just over 3 mm of retrolisthesis of C4 on C5. There is no other spondylolisthesis. Prevertebral soft tissues and predental space regions are normal. There is severe disc space narrowing at C3-4, C4-5, C5-6, and C6-7. There is moderate disc space narrowing at C7-T1. There is facet hypertrophy at multiple levels bilaterally. There is carotid artery calcification bilaterally. There is a sizable pleural effusion on the left. Calcification is noted in visualized innominate arteries. IMPRESSION: CT head: Atrophy with prior small basal ganglia region lacunar type infarcts and mild periventricular small vessel disease. No hemorrhage or acute infarct evident. No mass or midline shift. There are foci of arterial vascular calcification. There is a retention cyst in the left maxillary antrum. Bones somewhat osteoporotic. CT cervical spine: No fracture. Mild spondylolisthesis at C4-5, felt to be due to underlying spondylosis. Multilevel arthropathy. Calcification in multiple vessels including both  carotid arteries. Sizable right pleural effusion noted. Electronically Signed   By: Lowella Grip III M.D.   On: 08/28/2015 10:01  Dg Chest Port 1 View  Result Date: 08/28/2015 CLINICAL DATA:  Shortness of breath and cough EXAM: PORTABLE CHEST 1 VIEW COMPARISON:  March 09, 2015 FINDINGS: Prominent skin folds are seen over the right upper and lateral chest. No pneumothorax. A moderate left pleural effusion and underlying opacity is stable. A new right effusion is noted. There is also opacity throughout the right mid and lower lung. No other interval changes. IMPRESSION: 1. Stable left moderate-sized pleural effusion and underlying opacity. 2. New right pleural effusion and underlying opacity. More diffuse opacity seen throughout the right mid and lower lungs suggestive of infiltrate. Recommend follow-up to resolution. Electronically Signed   By: Dorise Bullion III M.D   On: 08/28/2015 10:08  Dg Foot Complete Right  Result Date: 08/28/2015 CLINICAL DATA:  Chronic right foot numbness. EXAM: RIGHT FOOT COMPLETE - 3+ VIEW COMPARISON:  None. FINDINGS: There is no evidence of fracture or dislocation. There is no evidence of arthropathy or other focal bone abnormality. Vascular calcifications are noted. IMPRESSION: No acute abnormality seen in the right foot. Electronically Signed  By: Marijo Conception, M.D.   On: 08/28/2015 10:10   ROS: difficult to obtain- pt not always coherent- he denies any pain, SOB Blood pressure (!) 84/72, pulse 66, temperature 97.9 F (36.6 C), temperature source Oral, resp. rate 17, height 5' 8.5" (1.74 m), weight 64.4 kg (142 lb), SpO2 99 %. General appearance: cachectic, pale and slowed mentation Eyes: conjunctivae/corneas clear. PERRL, EOM's intact. Fundi benign. Resp: diminished breath sounds bilaterally Cardio: systolic murmur: holosystolic 2/6, blowing at 2nd right intercostal space GI: soft, non-tender; bowel sounds normal; no masses,  no organomegaly Extremities:  edema 1 plus Neurologic: Grossly normal AVF patent  Assessment/Plan: 82 year old WM with multiple medical problems including ESRD presenting with weakness and found to have hypotension beyond normal hypotension for him 1 Hypotension- thought to possibly be related to sepsis.  Now on pressors.  Got a liter of fluid but I would prefer he not get more given pitting edema do not feel he is that dry 2 ESRD: normally MWF at [facility] via [facility]F- there is no need for HD right at this time and given his BP do not think he would tolerate.  Therefore, I will just observe him for needs of HD- likely will need tomorrow and will hope that is more hemodynamically stable at that time- if not, may need CRRT   3 Sepsis: placed on axithro and rocephin for possible pulmonary infection - per CCM- is a do not intubate 4. Anemia of ESRD: on mircera and venofer as OP- will be due mircera this week 5. Metabolic Bone Disease: normally on calcitriol and tums- hold for now until more stable    , A 08/28/2015, 1:29 PM

## 2015-08-28 NOTE — ED Notes (Signed)
Pt returned from CT °

## 2015-08-28 NOTE — ED Notes (Signed)
Xray at the bedside.

## 2015-08-28 NOTE — ED Triage Notes (Signed)
Per PTAR, Pt is [place] wife. Pt's wife reports patient became weak and fell. Pt has not eaten since yesterday at lunch. CBG 63 when PTAR checked. GIven 15 grams of Carbs and then CBg was 72. Pt has HX of dialysis with chronic weakness. Vital Signs: 118/72, 88 HR, 18 RR.

## 2015-08-28 NOTE — ED Notes (Signed)
Attempted Report x1.   

## 2015-08-28 NOTE — ED Notes (Signed)
MD Tomi Bamberger made aware of patient's BP. No new orders at this time

## 2015-08-28 NOTE — ED Notes (Signed)
RT at the bed side receiving ABG.

## 2015-08-28 NOTE — Progress Notes (Addendum)
Pharmacy Antibiotic Note  [name] is a 82 y.o. male admitted on 08/28/2015 with sepsis.  Pharmacy has been consulted for vancomycin and cefepime dosing.  Patient is ESRD on HD. Normal schedule is MWF- fell this morning in the house while getting ready to go to HD.  Was originally started on cefepime and vancomycin- received cefepime 2g IV x1 in the ED, vanc was ordered but not given before it was canceled.  Plan: -ceftriaxone 1g IV q24h starting 8/1 as cefepime dose given today -azithromycin 500mg  IV q24h -Follow up HD schedule/tolerance. May need CRRT d/t low BPs- will adjust dosing if that route is chosen  Height: 5' 8.5" (174 cm) Weight: 142 lb (64.4 kg) IBW/kg (Calculated) : 69.55  Temp (24hrs), Avg:97.9 F (36.6 C), Min:97.9 F (36.6 C), Max:97.9 F (36.6 C)   Recent Labs Lab 08/28/15 0807 08/28/15 0835  WBC 5.8  --   CREATININE 5.20* 5.00*    Estimated Creatinine Clearance: 9.7 mL/min (by C-G formula based on SCr of 5 mg/dL).    No Known Allergies  Antimicrobials this admission: Vanc 7/31 - ordered, never given Cefepime 7/31 x1 Ceftriaxone 8/1>> Azithromycin 7/31>>  Dose adjustments this admission: n/a  Microbiology results: 7/31 BCx: sent 7/31 UCx:    Thank you for allowing pharmacy to be a part of this patient's care.  [name] D. Dvid Pendry, PharmD, BCPS Clinical Pharmacist Pager: [phone]/31/2017 11:52 AM          Pharmacy Code Sepsis Protocol  Time of code sepsis page: 1130 [x]  Antibiotics delivered at 1148   Were antibiotics ordered at the time of the code sepsis page? Yes   Pharmacy consulted for: vancomycin and cefepime  Anti-infectives    Start     Dose/Rate Route Frequency Ordered Stop   08/28/15 1200  vancomycin (VANCOCIN) 1,500 mg in sodium chloride 0.9 % 500 mL IVPB     1,500 mg 250 mL/hr over 120 Minutes Intravenous  Once 08/28/15 1146     08/28/15 1130  ceFEPIme (MAXIPIME) 2 g in dextrose 5 % 50 mL IVPB     2 g 100  mL/hr over 30 Minutes Intravenous  Once 08/28/15 1129     08/28/15 1130  vancomycin (VANCOCIN) IVPB 1000 mg/200 mL premix  Status:  Discontinued     1,000 mg 200 mL/hr over 60 Minutes Intravenous  Once 08/28/15 1129 08/28/15 1146        Nurse education provided: []  Minutes left to administer antibiotics to achieve 1 hour goal [x]  Correct order of antibiotic administration []  Antibiotic Y-site compatibilities     [name], PharmD 08/28/2015, 11:50 AM

## 2015-08-28 NOTE — ED Notes (Signed)
MD Knapp at the bedside  

## 2015-08-28 NOTE — ED Notes (Signed)
Paged Admitting MD about patient's Troponin

## 2015-08-28 NOTE — Evaluation (Signed)
Clinical/Bedside Swallow Evaluation Patient Details  Name: [name] MRN: [id number] Date of Birth: 1926-12-30  Today's Date: 08/28/2015 Time: SLP Start Time (ACUTE ONLY): 3 SLP Stop Time (ACUTE ONLY): 1525 SLP Time Calculation (min) (ACUTE ONLY): 15 min  Past Medical History:  Past Medical History:  Diagnosis Date  . Acute infective polyneuritis ([place])   . Anemia    of chronic disease  . Aortic stenosis   . Atrial fibrillation ([place])   . Benign prostatic hypertrophy   . Colon cancer ([place])   . Colon polyp    adenomatous  . Dementia   . Depression   . Diverticulosis   . Encounter for long-term (current) use of other medications   . End stage renal disease ([place])   . Gout   . Hearing loss   . Hernia of unspecified site of abdominal cavity without mention of obstruction or gangrene    hiatal  . History of Clostridium difficile infection   . History of Guillain-Barre syndrome   . Hyperlipidemia   . Hyperparathyroidism   . Hyperparathyroidism ([place])   . Hypertension   . Hypothyroidism   . Internal hemorrhoids   . Pancytopenia ([place])   . Polyneuropathy in other diseases classified elsewhere ([place])   . Proctitis   . Renal failure   . Seizures ([place])   . Unspecified hereditary and idiopathic peripheral neuropathy   . Unspecified transient cerebral ischemia    Past Surgical History:  Past Surgical History:  Procedure Laterality Date  . KIDNEY TRANSPLANT     1998  . SKIN CANCER EXCISION     HPI:  Pt is an 82 y.o. male with PMH of AS, Afib, ESRD, HTN, hx Guillain-Barre presented 7/31 with c/o weakness, malaise and fall at home. CXR showed stable L moderate-size pleural effusion and underlying opacity, new R pleural effusion and underlying opacity, mor diffuse opacity seen throughout R mid and lower lungs suggestive of infiltrate. Bedside swallow eval ordered due to concerns for aspiration- RN swallow screen performed with history of dysphagia listed; however, no previous SLP  visit noted in chart review.   Assessment / Plan / Recommendation Clinical Impression  Pt had an immediate cough x1 after taking several consecutive large sips of thin liquid by straw. When cued to take 1 sip at a time, no other s/s of aspiration noted. Pt reported having difficulty at home with dry, crumbly foods such as cornbread. Given decreased respiratory status and reported hx of difficulty with some solids, pt is at an increased risk of aspiration. Recommend initiating dysphagia 3 diet, thin liquids, meds whole in puree (crushed if large), full supervision to cue pt to take small bites/ sips and ensure pt seated upright. Will continue to follow to ensure diet tolerance.    Aspiration Risk  Mild aspiration risk    Diet Recommendation Dysphagia 3 (Mech soft);Thin liquid   Liquid Administration via: Cup;Straw Medication Administration: Whole meds with puree Supervision: Full supervision/cueing for compensatory strategies;Patient able to self feed Compensations: Slow rate;Small sips/bites Postural Changes: Seated upright at 90 degrees    Other  Recommendations Oral Care Recommendations: Oral care BID Other Recommendations: Clarify dietary restrictions   Follow up Recommendations  Other (comment) (TBD)    Frequency and Duration min 2x/week  1 week       Prognosis Prognosis for Safe Diet Advancement: Fair      Swallow Study   General HPI: Pt is an 82 y.o. male with PMH of AS, Afib, ESRD, HTN, hx Guillain-Barre  presented 7/31 with c/o weakness, malaise and fall at home. CXR showed stable L moderate-size pleural effusion and underlying opacity, new R pleural effusion and underlying opacity, mor diffuse opacity seen throughout R mid and lower lungs suggestive of infiltrate. Bedside swallow eval ordered due to concerns for aspiration- RN swallow screen performed with history of dysphagia listed; however, no previous SLP visit noted in chart review. Type of Study: Bedside Swallow  Evaluation Previous Swallow Assessment: none on record Diet Prior to this Study: NPO Temperature Spikes Noted: No Respiratory Status: Nasal cannula History of Recent Intubation: No Behavior/Cognition: Alert;Cooperative Oral Cavity Assessment: Within Functional Limits Oral Care Completed by SLP: Yes Oral Cavity - Dentition: Poor condition;Missing dentition Vision: Functional for self-feeding Self-Feeding Abilities: Able to feed self Patient Positioning: Upright in bed Baseline Vocal Quality: Hoarse Volitional Cough: Strong Volitional Swallow: Able to elicit    Oral/Motor/Sensory Function Overall Oral Motor/Sensory Function: Mild impairment Facial ROM: Within Functional Limits Facial Symmetry: Within Functional Limits Facial Strength: Within Functional Limits Facial Sensation: Within Functional Limits Lingual ROM: Within Functional Limits Lingual Symmetry: Within Functional Limits Lingual Strength: Reduced Lingual Sensation: Within Functional Limits   Ice Chips Ice chips: Not tested   Thin Liquid Thin Liquid: Impaired Presentation: Cup;Straw Pharyngeal  Phase Impairments: Cough - Immediate (x1- after consecutive sips by straw)    Nectar Thick Nectar Thick Liquid: Not tested   Honey Thick Honey Thick Liquid: Not tested   Puree Puree: Within functional limits Presentation: Self Fed;Spoon   Solid   GO   Solid: Impaired Presentation: Self Fed Oral Phase Impairments: Impaired mastication        Gilmore Laroche, Hutchinson Isenberg K, MA, CCC-SLP 08/28/2015,3:31 PM [phone]

## 2015-08-28 NOTE — Progress Notes (Signed)
[facility] Progress Note Patient Name: [name] DOB: 07-07-1926 MRN: [id number]   Date of Service  08/28/2015  HPI/Events of Note  Hypotension, mental status unchanged Pressor dose increasing  eICU Interventions  Place arterial line Check lactic acid     Intervention Category Major Interventions: Hypotension - evaluation and management  Simonne Maffucci 08/28/2015, 8:31 PM

## 2015-08-28 NOTE — Progress Notes (Signed)
[place] Progress Note Patient Name: [name] DOB: 1926-09-09 MRN: [id number]   Date of Service  08/28/2015  HPI/Events of Note  Sepsis, called about fluid orders:  ESRD Receiving fluids On neo Lactic acid normal  eICU Interventions  kvo fluids     Intervention Category Intermediate Interventions: Hypotension - evaluation and management  Simonne Maffucci 08/28/2015, 7:07 PM

## 2015-08-28 NOTE — ED Notes (Signed)
Spoke with Patient's wife on the phone. Reports patient has been deteriating in health for some time now. New onset of falls in the last couple weeks with multiple occurrences. Pt's wife states that he was getting up this morning to go to dialysis when he collapsed outside the bathroom. It was an unwitnessed fall, but pt called for wife to come. Baseline is walking with rolling walker.   [place]

## 2015-08-28 NOTE — H&P (Signed)
PULMONARY / CRITICAL CARE MEDICINE   Name: LEN AZEEZ MRN: [id number] DOB: 25-Jun-1926    ADMISSION DATE:  08/28/2015  REFERRING MD:  EDP (knapp)   CHIEF COMPLAINT:  Hypotension, sepsis   HISTORY OF PRESENT ILLNESS:   82yo male with multiple medical problems including AS, Afib, ESRD, HTN, hx Guillain-Barre presented 7/31 with c/o weakness, malaise and fall at home.  In ER had progressive hypotension despite volume and chronic R sided effusion (previously thought ?malignant) and ?infiltrate on CXR.  PCCM called for ICU admit.   Currently denies pain, SOB, chest pain.  Still "feels bad".  Denies noted fevers, chills, sick contacts, n/v/d.  Some mild increased BLE edema over the last week.  Usually gets HD m/w/f - was due today.   PAST MEDICAL HISTORY :  He  has a past medical history of Acute infective polyneuritis ([place]); Anemia; Aortic stenosis; Atrial fibrillation ([place]); Benign prostatic hypertrophy; Colon cancer ([place]); Colon polyp; Dementia; Depression; Diverticulosis; Encounter for long-term (current) use of other medications; End stage renal disease ([place]); Gout; Hearing loss; Hernia of unspecified site of abdominal cavity without mention of obstruction or gangrene; History of Clostridium difficile infection; History of Guillain-Barre syndrome; Hyperlipidemia; Hyperparathyroidism; Hyperparathyroidism ([place]); Hypertension; Hypothyroidism; Internal hemorrhoids; Pancytopenia ([place]); Polyneuropathy in other diseases classified elsewhere [facility]); Proctitis; Renal failure; Seizures ([place]); Unspecified hereditary and idiopathic peripheral neuropathy; and Unspecified transient cerebral ischemia.  PAST SURGICAL HISTORY: He  has a past surgical history that includes Kidney transplant and Skin cancer excision.  No Known Allergies  No current facility-administered medications on file prior to encounter.    Current Outpatient Prescriptions on File Prior to Encounter  Medication Sig  . allopurinol  (ZYLOPRIM) 100 MG tablet Take 100 mg by mouth daily.  Marland Kitchen aspirin 81 MG tablet Take 81 mg by mouth daily.    . B Complex-C-Folic Acid (DIALYVITE TABLET) TABS Take 1 tablet by mouth daily.   . calcium carbonate (TUMS - DOSED IN MG ELEMENTAL CALCIUM) 500 MG chewable tablet Chew 1 tablet by mouth 2 (two) times daily with a meal.   . carvedilol (COREG) 25 MG tablet Take 25 mg by mouth at bedtime.   . Cholecalciferol (VITAMIN D-3 PO) Take 2 tablets by mouth every morning.   . folic acid (FOLVITE) 1 MG tablet Take 1 mg by mouth daily.    Marland Kitchen lamoTRIgine (LAMICTAL) 100 MG tablet TAKE 1/2 TABLET BY MOUTH EVERY MORNING AND 1 TABLET AT BEDTIME  . latanoprost (XALATAN) 0.005 % ophthalmic solution Place 1 drop into both eyes at bedtime.   Marland Kitchen levothyroxine (SYNTHROID, LEVOTHROID) 75 MCG tablet Take 75 mcg by mouth daily before breakfast.  . mirtazapine (REMERON) 30 MG tablet Take 1 tablet (30 mg total) by mouth at bedtime.  Marland Kitchen omeprazole (PRILOSEC) 20 MG capsule Take 20 mg by mouth daily.  . sertraline (ZOLOFT) 25 MG tablet Take 1 tablet (25 mg total) by mouth daily.  . simvastatin (ZOCOR) 20 MG tablet Take 20 mg by mouth at bedtime.      FAMILY HISTORY:  His indicated that his mother is deceased. He indicated that his father is deceased. He indicated that the status of his brother is unknown. He indicated that the status of his son is unknown. He indicated that the status of his neg hx is unknown.    SOCIAL HISTORY: He  reports that he quit smoking about 54 years ago. His smoking use included Cigarettes. He has a 120.00 pack-year smoking history. He has never used smokeless tobacco.  He reports that he does not drink alcohol or use drugs.  REVIEW OF SYSTEMS:   As per HPI - All other systems reviewed and were neg.    SUBJECTIVE:    VITAL SIGNS: BP (!) 74/59   Pulse 88   Temp 97.9 F (36.6 C) (Oral)   Resp 15   Ht 5' 8.5" (1.74 m)   Wt 64.4 kg (142 lb)   SpO2 98%   BMI 21.28 kg/m    HEMODYNAMICS:    VENTILATOR SETTINGS:    INTAKE / OUTPUT: No intake/output data recorded.  PHYSICAL EXAMINATION: General:  Pleasant but chronically ill appearing male, NAD  Neuro:  Awake, alert, slow to respond at times but appropriate, MAE, gen weakness  HEENT:  Mm dry, no JVD  Cardiovascular:  s1s2 rrr, +murmur Lungs:  resps even non labored on [place], diminished R>L, few scattered rhonchi  Abdomen:  Round, soft, non tender, +bs  Musculoskeletal:  Warm and dry, 1+BLE edema, L arm AV fistula   LABS:  BMET  Recent Labs Lab 08/28/15 0807 08/28/15 0835  NA 136 138  K 3.3* 3.3*  CL 98* 94*  CO2 28  --   BUN 28* 28*  CREATININE 5.20* 5.00*  GLUCOSE 88 81    Electrolytes  Recent Labs Lab 08/28/15 0807  CALCIUM 8.7*    CBC  Recent Labs Lab 08/28/15 0807 08/28/15 0835  WBC 5.8  --   HGB 9.5* 10.9*  HCT 31.0* 32.0*  PLT 50*  --     Coag's  Recent Labs Lab 08/28/15 0807  INR 1.29    Sepsis Markers No results for input(s): LATICACIDVEN, PROCALCITON, O2SATVEN in the last 168 hours.  ABG  Recent Labs Lab 08/28/15 0858  PHART 7.530*  PCO2ART 38.2  PO2ART 265.0*    Liver Enzymes  Recent Labs Lab 08/28/15 0807  AST 133*  ALT 38  ALKPHOS 332*  BILITOT 3.7*  ALBUMIN 2.8*    Cardiac Enzymes No results for input(s): TROPONINI, PROBNP in the last 168 hours.  Glucose  Recent Labs Lab 08/28/15 0902  GLUCAP 87    Imaging Ct Head Wo Contrast  Result Date: 08/28/2015 CLINICAL DATA:  Several recent falls. Chronic renal failure. Lethargy. EXAM: CT HEAD WITHOUT CONTRAST CT CERVICAL SPINE WITHOUT CONTRAST TECHNIQUE: Multidetector CT imaging of the head and cervical spine was performed following the standard protocol without intravenous contrast. Multiplanar CT image reconstructions of the cervical spine were also generated. COMPARISON:  Head CT April 02, 2013 FINDINGS: CT HEAD FINDINGS There is mild diffuse atrophy which is stable. There is no  intracranial mass, hemorrhage, extra-axial fluid collection, or midline shift. There is mild patchy small vessel disease in the centra semiovale bilaterally. There are scattered punctate basal ganglia lacunar type infarcts, stable. There is been a prior small infarct in the anterior limb of the right external capsule, stable. No acute infarct evident. Bony calvarium appears intact. Bones are osteoporotic, particularly in the medial sphenoid bone regions, stable. Mastoid air cells are clear. There is no hyperdense vessel. There is calcification in the carotid siphon and cavernous carotid artery regions. There is a retention cyst in the left maxillary antrum. Other paranasal sinuses are clear. Orbits appear symmetric bilaterally. CT CERVICAL SPINE FINDINGS There is no demonstrable acute fracture. There is just over 3 mm of retrolisthesis of C4 on C5. There is no other spondylolisthesis. Prevertebral soft tissues and predental space regions are normal. There is severe disc space narrowing at C3-4, C4-5, C5-6, and C6-7.  There is moderate disc space narrowing at C7-T1. There is facet hypertrophy at multiple levels bilaterally. There is carotid artery calcification bilaterally. There is a sizable pleural effusion on the left. Calcification is noted in visualized innominate arteries. IMPRESSION: CT head: Atrophy with prior small basal ganglia region lacunar type infarcts and mild periventricular small vessel disease. No hemorrhage or acute infarct evident. No mass or midline shift. There are foci of arterial vascular calcification. There is a retention cyst in the left maxillary antrum. Bones somewhat osteoporotic. CT cervical spine: No fracture. Mild spondylolisthesis at C4-5, felt to be due to underlying spondylosis. Multilevel arthropathy. Calcification in multiple vessels including both carotid arteries. Sizable right pleural effusion noted. Electronically Signed   By: Lowella Grip III M.D.   On: 08/28/2015  10:01  Ct Cervical Spine Wo Contrast  Result Date: 08/28/2015 CLINICAL DATA:  Several recent falls. Chronic renal failure. Lethargy. EXAM: CT HEAD WITHOUT CONTRAST CT CERVICAL SPINE WITHOUT CONTRAST TECHNIQUE: Multidetector CT imaging of the head and cervical spine was performed following the standard protocol without intravenous contrast. Multiplanar CT image reconstructions of the cervical spine were also generated. COMPARISON:  Head CT April 02, 2013 FINDINGS: CT HEAD FINDINGS There is mild diffuse atrophy which is stable. There is no intracranial mass, hemorrhage, extra-axial fluid collection, or midline shift. There is mild patchy small vessel disease in the centra semiovale bilaterally. There are scattered punctate basal ganglia lacunar type infarcts, stable. There is been a prior small infarct in the anterior limb of the right external capsule, stable. No acute infarct evident. Bony calvarium appears intact. Bones are osteoporotic, particularly in the medial sphenoid bone regions, stable. Mastoid air cells are clear. There is no hyperdense vessel. There is calcification in the carotid siphon and cavernous carotid artery regions. There is a retention cyst in the left maxillary antrum. Other paranasal sinuses are clear. Orbits appear symmetric bilaterally. CT CERVICAL SPINE FINDINGS There is no demonstrable acute fracture. There is just over 3 mm of retrolisthesis of C4 on C5. There is no other spondylolisthesis. Prevertebral soft tissues and predental space regions are normal. There is severe disc space narrowing at C3-4, C4-5, C5-6, and C6-7. There is moderate disc space narrowing at C7-T1. There is facet hypertrophy at multiple levels bilaterally. There is carotid artery calcification bilaterally. There is a sizable pleural effusion on the left. Calcification is noted in visualized innominate arteries. IMPRESSION: CT head: Atrophy with prior small basal ganglia region lacunar type infarcts and mild  periventricular small vessel disease. No hemorrhage or acute infarct evident. No mass or midline shift. There are foci of arterial vascular calcification. There is a retention cyst in the left maxillary antrum. Bones somewhat osteoporotic. CT cervical spine: No fracture. Mild spondylolisthesis at C4-5, felt to be due to underlying spondylosis. Multilevel arthropathy. Calcification in multiple vessels including both carotid arteries. Sizable right pleural effusion noted. Electronically Signed   By: Lowella Grip III M.D.   On: 08/28/2015 10:01  Dg Chest Port 1 View  Result Date: 08/28/2015 CLINICAL DATA:  Shortness of breath and cough EXAM: PORTABLE CHEST 1 VIEW COMPARISON:  March 09, 2015 FINDINGS: Prominent skin folds are seen over the right upper and lateral chest. No pneumothorax. A moderate left pleural effusion and underlying opacity is stable. A new right effusion is noted. There is also opacity throughout the right mid and lower lung. No other interval changes. IMPRESSION: 1. Stable left moderate-sized pleural effusion and underlying opacity. 2. New right pleural effusion and underlying opacity.  More diffuse opacity seen throughout the right mid and lower lungs suggestive of infiltrate. Recommend follow-up to resolution. Electronically Signed   By: Dorise Bullion III M.D   On: 08/28/2015 10:08  Dg Foot Complete Right  Result Date: 08/28/2015 CLINICAL DATA:  Chronic right foot numbness. EXAM: RIGHT FOOT COMPLETE - 3+ VIEW COMPARISON:  None. FINDINGS: There is no evidence of fracture or dislocation. There is no evidence of arthropathy or other focal bone abnormality. Vascular calcifications are noted. IMPRESSION: No acute abnormality seen in the right foot. Electronically Signed   By: Marijo Conception, M.D.   On: 08/28/2015 10:10    STUDIES:  CT head/ c-spine 7/31>>> neg acute   CULTURES: BC x 2 7/31>>>   ANTIBIOTICS: Rocephin 7/31>>> Azithro 7/31>>>  SIGNIFICANT  EVENTS:   LINES/TUBES:   DISCUSSION: 82yo male with multiple medical problems including ESRD, chronic R effusion, AS, AFib admitted 7/31 with hypotension and ?sepsis.   ASSESSMENT / PLAN:  PULMONARY Chronic R effusion - had thora 02/14/2015   X 1 liter/ glucose 106/ Wbc 160 with L >P,  cyt POS atypical cells  ? R sided CAP - NO recent hospitalization P:   Rocephin/azithro as above  Pulmonary hygiene  F/u CXR  Consider retap R effusion if symptomatic v pleur-x  Supplemental O2 as needed   CARDIOVASCULAR Hypotension - unclear etiology. ?sepsis r/t CAP v other source v cardiac etiology.  AFib - not on anticoagulation  P:  Very gentle volume (ESRD, due for HD) Trend troponin, lactate, pct  Low dose NEO if needed  Hold home coreg  Echo   RENAL ESRD  Hypokalemia  P:   Renal to see - doubt would tolerate iHD right now r/t hypotension but no acute need  F/u chem  Replete K as needed    GASTROINTESTINAL elevated alk phos/bili   P:   F/u LFT's  Consider abd u/s   HEMATOLOGIC Thrombocytopenia  P:  SCD's  F/u CBC   INFECTIOUS ?CAP  P:   Rocephin/azithro as above  Pan culture  Trend lactate, pct   ENDOCRINE Hypothyroid   P:   Continue home synthroid   NEUROLOGIC Hx seizures  P:   Continue home lamictal    FAMILY  - Updates:  No family at bedside 7/31  - Inter-disciplinary family meet or Palliative Care meeting due by:  8/7  Discussed goals of care with pt given his multiple medical problems.  He wants to think about it some more and discuss with his wife, but as of right now he would NOT want intubation or CPR/Defib but WOULD want CVL, pressors, CVVHD if needed.    Pulmonary and [place] Pager: [phone]  08/28/2015, 12:14 PM  82 Y/O with PMH of AS, A fib, ESRD, HTN, Guillian Barre, chronic lt pleural effusion. Admitted with hypotension, sepsis from PNA CXR shows new rt effusion and Rt lung opacity.   -IVF  for resuscitation -May need CVL and pressors if BP is not responding -Rocephin and azithromycin -Check echocardiogram, troponins -Nephrology consult for ESRD -Check RUQ U/S to evaluate elevated LFTs  Rest of plan as above Critical care time- 35 mins. This represents my independent time taking care of the patient.  Marshell Garfinkel MD [place] [facility] Pager [phone] If no answer or after 3pm call: [phone] 08/28/2015, 1:02 PM

## 2015-08-29 ENCOUNTER — Inpatient Hospital Stay (HOSPITAL_COMMUNITY): Payer: Medicare HMO

## 2015-08-29 DIAGNOSIS — N189 Chronic kidney disease, unspecified: Secondary | ICD-10-CM

## 2015-08-29 DIAGNOSIS — R6521 Severe sepsis with septic shock: Secondary | ICD-10-CM

## 2015-08-29 DIAGNOSIS — A419 Sepsis, unspecified organism: Secondary | ICD-10-CM

## 2015-08-29 DIAGNOSIS — Z7189 Other specified counseling: Secondary | ICD-10-CM

## 2015-08-29 DIAGNOSIS — I350 Nonrheumatic aortic (valve) stenosis: Secondary | ICD-10-CM

## 2015-08-29 DIAGNOSIS — J189 Pneumonia, unspecified organism: Secondary | ICD-10-CM

## 2015-08-29 DIAGNOSIS — G9340 Encephalopathy, unspecified: Secondary | ICD-10-CM

## 2015-08-29 DIAGNOSIS — R7881 Bacteremia: Secondary | ICD-10-CM

## 2015-08-29 LAB — BLOOD CULTURE ID PANEL (REFLEXED)
ACINETOBACTER BAUMANNII: NOT DETECTED
CANDIDA KRUSEI: NOT DETECTED
CANDIDA PARAPSILOSIS: NOT DETECTED
CARBAPENEM RESISTANCE: NOT DETECTED
Candida albicans: NOT DETECTED
Candida glabrata: NOT DETECTED
Candida tropicalis: NOT DETECTED
ENTEROCOCCUS SPECIES: NOT DETECTED
Enterobacter cloacae complex: NOT DETECTED
Enterobacteriaceae species: DETECTED — AB
Escherichia coli: DETECTED — AB
Haemophilus influenzae: NOT DETECTED
KLEBSIELLA OXYTOCA: NOT DETECTED
KLEBSIELLA PNEUMONIAE: NOT DETECTED
LISTERIA MONOCYTOGENES: NOT DETECTED
Methicillin resistance: NOT DETECTED
NEISSERIA MENINGITIDIS: NOT DETECTED
Proteus species: NOT DETECTED
Pseudomonas aeruginosa: NOT DETECTED
SERRATIA MARCESCENS: NOT DETECTED
STAPHYLOCOCCUS SPECIES: NOT DETECTED
Staphylococcus aureus (BCID): NOT DETECTED
Streptococcus agalactiae: NOT DETECTED
Streptococcus pneumoniae: NOT DETECTED
Streptococcus pyogenes: NOT DETECTED
Streptococcus species: NOT DETECTED
Vancomycin resistance: NOT DETECTED

## 2015-08-29 LAB — HEPATIC FUNCTION PANEL
ALBUMIN: 2.5 g/dL — AB (ref 3.5–5.0)
ALK PHOS: 249 U/L — AB (ref 38–126)
ALT: 26 U/L (ref 17–63)
AST: 97 U/L — AB (ref 15–41)
BILIRUBIN TOTAL: 2.6 mg/dL — AB (ref 0.3–1.2)
Bilirubin, Direct: 1.4 mg/dL — ABNORMAL HIGH (ref 0.1–0.5)
Indirect Bilirubin: 1.2 mg/dL — ABNORMAL HIGH (ref 0.3–0.9)
Total Protein: 5.4 g/dL — ABNORMAL LOW (ref 6.5–8.1)

## 2015-08-29 LAB — BASIC METABOLIC PANEL
Anion gap: 11 (ref 5–15)
BUN: 34 mg/dL — AB (ref 6–20)
CHLORIDE: 95 mmol/L — AB (ref 101–111)
CO2: 26 mmol/L (ref 22–32)
CREATININE: 5.52 mg/dL — AB (ref 0.61–1.24)
Calcium: 8.3 mg/dL — ABNORMAL LOW (ref 8.9–10.3)
GFR calc Af Amer: 10 mL/min — ABNORMAL LOW (ref >60)
GFR, EST NON AFRICAN AMERICAN: 8 mL/min — AB (ref >60)
GLUCOSE: 104 mg/dL — AB (ref 65–99)
POTASSIUM: 3.5 mmol/L (ref 3.5–5.1)
SODIUM: 132 mmol/L — AB (ref 135–145)

## 2015-08-29 LAB — CBC
HCT: 31.8 % — ABNORMAL LOW (ref 39.0–52.0)
Hemoglobin: 10.1 g/dL — ABNORMAL LOW (ref 13.0–17.0)
MCH: 33.3 pg (ref 26.0–34.0)
MCHC: 31.8 g/dL (ref 30.0–36.0)
MCV: 105 fL — AB (ref 78.0–100.0)
PLATELETS: 52 10*3/uL — AB (ref 150–400)
RBC: 3.03 MIL/uL — ABNORMAL LOW (ref 4.22–5.81)
RDW: 16.9 % — ABNORMAL HIGH (ref 11.5–15.5)
WBC: 8 10*3/uL (ref 4.0–10.5)

## 2015-08-29 LAB — ECHOCARDIOGRAM COMPLETE
Height: 69 in
WEIGHTICAEL: 2440.93 [oz_av]

## 2015-08-29 LAB — TROPONIN I: Troponin I: 0.2 ng/mL (ref <0.03)

## 2015-08-29 MED ORDER — HYDROCORTISONE NA SUCCINATE PF 100 MG IJ SOLR
50.0000 mg | Freq: Four times a day (QID) | INTRAMUSCULAR | Status: DC
  Administered 2015-08-29 – 2015-09-01 (×13): 50 mg via INTRAVENOUS
  Filled 2015-08-29 (×10): qty 1
  Filled 2015-08-29 (×3): qty 2
  Filled 2015-08-29: qty 1

## 2015-08-29 MED ORDER — DEXTROSE 5 % IV SOLN
2.0000 g | INTRAVENOUS | Status: AC
  Administered 2015-08-29 – 2015-09-03 (×6): 2 g via INTRAVENOUS
  Filled 2015-08-29 (×6): qty 2

## 2015-08-29 MED ORDER — MIDODRINE HCL 5 MG PO TABS
5.0000 mg | ORAL_TABLET | Freq: Three times a day (TID) | ORAL | Status: DC
  Administered 2015-08-29 – 2015-08-30 (×3): 5 mg via ORAL
  Filled 2015-08-29 (×5): qty 1

## 2015-08-29 NOTE — Procedures (Signed)
Central Venous Catheter Insertion Procedure Note [name] [id number] 12-03-1926  Procedure: Insertion of Central Venous Catheter Indications: Assessment of intravascular volume, Drug and/or fluid administration and Frequent blood sampling  Procedure Details Consent: Risks of procedure as well as the alternatives and risks of each were explained to the (patient/caregiver).  Consent for procedure obtained. Time Out: Verified patient identification, verified procedure, site/side was marked, verified correct patient position, special equipment/implants available, medications/allergies/relevent history reviewed, required imaging and test results available.  Performed  Maximum sterile technique was used including antiseptics, cap, gloves, gown, hand hygiene, mask and sheet. Skin prep: Chlorhexidine; local anesthetic administered A antimicrobial bonded/coated triple lumen catheter was placed in the right internal jugular vein using the Seldinger technique.  Evaluation Blood flow good Complications: No apparent complications Patient did tolerate procedure well. Chest X-ray ordered to verify placement.  CXR: pending.  Procedure performed under direct ultrasound guidance for real time vessel cannulation.      Montey Hora, [facility] Pager: [phone]  or [phone] 08/29/2015, 2:38 PM  Rush Farmer, M.D. [facility] Pulmonary/Critical Care Medicine. Pager: [phone]. After hours pager: [phone].

## 2015-08-29 NOTE — Progress Notes (Signed)
Speech Language Pathology Treatment: Dysphagia  Patient Details Name: [name] MRN: [id number] DOB: 12-Nov-1926 Today's Date: 08/29/2015 Time: [id number] SLP Time Calculation (min) (ACUTE ONLY): 22 min  Assessment / Plan / Recommendation Clinical Impression  SLP provided skilled observation during breakfast meal. He had prolonged mastication with soft solids, requiring Mod faded to Min cueing for utilization of a slower pace and liquid washes to assist with better clearance of residuals. He reports having frequent coughing during meals at home, but no overt s/s of aspiration are observed at bedside today. Further SLP f/u is indicated due to risks from h/o dysphagia, current respiratory status with PNA, and generalized deconditioning.    HPI HPI: Pt is an 82 y.o. male with PMH of AS, Afib, ESRD, HTN, hx Guillain-Barre presented 7/31 with c/o weakness, malaise and fall at home. CXR showed stable L moderate-size pleural effusion and underlying opacity, new R pleural effusion and underlying opacity, mor diffuse opacity seen throughout R mid and lower lungs suggestive of infiltrate. Bedside swallow eval ordered due to concerns for aspiration- RN swallow screen performed with history of dysphagia listed; however, no previous SLP visit noted in chart review.      SLP Plan  Continue with current plan of care     Recommendations  Diet recommendations: Dysphagia 3 (mechanical soft);Thin liquid Liquids provided via: Cup;No straw Medication Administration: Whole meds with puree Supervision: Staff to assist with self feeding;Full supervision/cueing for compensatory strategies Compensations: Slow rate;Small sips/bites;Follow solids with liquid Postural Changes and/or Swallow Maneuvers: Seated upright 90 degrees;Upright 30-60 min after meal             Oral Care Recommendations: Oral care BID;Oral care before and after PO Follow up Recommendations:  (tba) Plan: Continue with current plan of  care     GO                [name] 08/29/2015, 9:53 AM  [name], M.A. CCC-SLP [phone]

## 2015-08-29 NOTE — Progress Notes (Signed)
Pt slightly drowsy, oriented x3. BP decreased even of  of drip. MD notified and aware. Conset was taken from wife by phone for central line placement. ECCO at the bedside

## 2015-08-29 NOTE — Care Management Note (Signed)
Case Management Note  Patient Details  Name: [name] MRN: [id number] Date of Birth: 1926/12/07  Subjective/Objective:         Pt admitted post fall           Action/Plan:  PTA from home with family, goes to outpt HD.  CM will continue to follow for discharge needs   Expected Discharge Date:                  Expected Discharge Plan:  [place]  In-House Referral:     Discharge planning Services  CM Consult  Post Acute Care Choice:    Choice offered to:     DME Arranged:    DME Agency:     HH Arranged:    HH Agency:     Status of Service:  In process, will continue to follow  If discussed at Long Length of Stay Meetings, dates discussed:    Additional Comments:  Maryclare Labrador, RN 08/29/2015, 2:06 PM

## 2015-08-29 NOTE — Progress Notes (Signed)
Echocardiogram 2D Echocardiogram has been performed.  [name] 08/29/2015, 1:33 PM

## 2015-08-29 NOTE — Progress Notes (Signed)
PHARMACY - PHYSICIAN COMMUNICATION CRITICAL VALUE ALERT - BLOOD CULTURE IDENTIFICATION (BCID)  Results for orders placed or performed during the hospital encounter of 08/28/15  Blood Culture ID Panel (Reflexed) (Collected: 08/28/2015  8:30 AM)  Result Value Ref Range   Enterococcus species NOT DETECTED NOT DETECTED   Vancomycin resistance NOT DETECTED NOT DETECTED   Listeria monocytogenes NOT DETECTED NOT DETECTED   Staphylococcus species NOT DETECTED NOT DETECTED   Staphylococcus aureus NOT DETECTED NOT DETECTED   Methicillin resistance NOT DETECTED NOT DETECTED   Streptococcus species NOT DETECTED NOT DETECTED   Streptococcus agalactiae NOT DETECTED NOT DETECTED   Streptococcus pneumoniae NOT DETECTED NOT DETECTED   Streptococcus pyogenes NOT DETECTED NOT DETECTED   Acinetobacter baumannii NOT DETECTED NOT DETECTED   Enterobacteriaceae species DETECTED (A) NOT DETECTED   Enterobacter cloacae complex NOT DETECTED NOT DETECTED   Escherichia coli DETECTED (A) NOT DETECTED   Klebsiella oxytoca NOT DETECTED NOT DETECTED   Klebsiella pneumoniae NOT DETECTED NOT DETECTED   Proteus species NOT DETECTED NOT DETECTED   Serratia marcescens NOT DETECTED NOT DETECTED   Carbapenem resistance NOT DETECTED NOT DETECTED   Haemophilus influenzae NOT DETECTED NOT DETECTED   Neisseria meningitidis NOT DETECTED NOT DETECTED   Pseudomonas aeruginosa NOT DETECTED NOT DETECTED   Candida albicans NOT DETECTED NOT DETECTED   Candida glabrata NOT DETECTED NOT DETECTED   Candida krusei NOT DETECTED NOT DETECTED   Candida parapsilosis NOT DETECTED NOT DETECTED   Candida tropicalis NOT DETECTED NOT DETECTED    Name of physician (or Provider) Contacted: Dr Corrie Dandy  Changes to prescribed antibiotics required: Will increase Rocephin to 2g IV Q24H.  Wynona Neat, PharmD, BCPS  08/29/2015  3:39 AM

## 2015-08-29 NOTE — Progress Notes (Signed)
PULMONARY / CRITICAL CARE MEDICINE   Name: [name] MRN: [id number] DOB: 1926-11-24    ADMISSION DATE:  08/28/2015  REFERRING MD:  EDP (knapp)   CHIEF COMPLAINT:  Hypotension, sepsis   HISTORY OF PRESENT ILLNESS:   82yo male with multiple medical problems including AS, Afib, ESRD, HTN, hx Guillain-Barre presented 7/31 with c/o weakness, malaise and fall at home.  In ER had progressive hypotension despite volume and chronic R sided effusion (previously thought ?malignant) and ?infiltrate on CXR.  PCCM called for ICU admit.   Currently denies pain, SOB, chest pain.  Still "feels bad".  Denies noted fevers, chills, sick contacts, n/v/d.  Some mild increased BLE edema over the last week.  Usually gets HD m/w/f - was due today.   SUBJECTIVE: No events overnight  VITAL SIGNS: BP 101/79   Pulse 89   Temp 97.4 F (36.3 C) (Oral)   Resp 13   Ht 5' 9"  (1.753 m)   Wt 69.2 kg (152 lb 8.9 oz)   SpO2 98%   BMI 22.53 kg/m   HEMODYNAMICS:    VENTILATOR SETTINGS:    INTAKE / OUTPUT: I/O last 3 completed shifts: In: 1846.4 [I.V.:1476.4; Other:120; IV Piggyback:250] Out: -   PHYSICAL EXAMINATION: General:  Pleasant but chronically ill appearing male, NAD  Neuro:  Awake, alert, moving all ext to command. HEENT:  Mm dry, no JVD. Cardiovascular:  s1s2 rrr, +murmur. Lungs:  resps even non labored on [place], diminished R>L, few scattered rhonchi  Abdomen:  Round, soft, non tender, +bs  Musculoskeletal:  Warm and dry, 1+BLE edema, L arm AV fistula   LABS:  BMET  Recent Labs Lab 08/28/15 0807 08/28/15 0835 08/29/15 0425  NA 136 138 132*  K 3.3* 3.3* 3.5  CL 98* 94* 95*  CO2 28  --  26  BUN 28* 28* 34*  CREATININE 5.20* 5.00* 5.52*  GLUCOSE 88 81 104*    Electrolytes  Recent Labs Lab 08/28/15 0807 08/29/15 0425  CALCIUM 8.7* 8.3*    CBC  Recent Labs Lab 08/28/15 0807 08/28/15 0835 08/29/15 0425  WBC 5.8  --  8.0  HGB 9.5* 10.9* 10.1*  HCT 31.0* 32.0* 31.8*   PLT 50*  --  52*    Coag's  Recent Labs Lab 08/28/15 0807  INR 1.29    Sepsis Markers  Recent Labs Lab 08/28/15 1255 08/28/15 1259 08/28/15 1836 08/28/15 2054  LATICACIDVEN  --  1.73 1.9 1.8  PROCALCITON 14.64  --   --   --     ABG  Recent Labs Lab 08/28/15 0858  PHART 7.530*  PCO2ART 38.2  PO2ART 265.0*    Liver Enzymes  Recent Labs Lab 08/28/15 0807  AST 133*  ALT 38  ALKPHOS 332*  BILITOT 3.7*  ALBUMIN 2.8*    Cardiac Enzymes  Recent Labs Lab 08/28/15 1255 08/28/15 1836 08/29/15 0026  TROPONINI 0.09* 0.16* 0.20*    Glucose  Recent Labs Lab 08/28/15 0902  GLUCAP 87    Imaging Ct Head Wo Contrast  Result Date: 08/28/2015 CLINICAL DATA:  Several recent falls. Chronic renal failure. Lethargy. EXAM: CT HEAD WITHOUT CONTRAST CT CERVICAL SPINE WITHOUT CONTRAST TECHNIQUE: Multidetector CT imaging of the head and cervical spine was performed following the standard protocol without intravenous contrast. Multiplanar CT image reconstructions of the cervical spine were also generated. COMPARISON:  Head CT April 02, 2013 FINDINGS: CT HEAD FINDINGS There is mild diffuse atrophy which is stable. There is no intracranial mass, hemorrhage, extra-axial  fluid collection, or midline shift. There is mild patchy small vessel disease in the centra semiovale bilaterally. There are scattered punctate basal ganglia lacunar type infarcts, stable. There is been a prior small infarct in the anterior limb of the right external capsule, stable. No acute infarct evident. Bony calvarium appears intact. Bones are osteoporotic, particularly in the medial sphenoid bone regions, stable. Mastoid air cells are clear. There is no hyperdense vessel. There is calcification in the carotid siphon and cavernous carotid artery regions. There is a retention cyst in the left maxillary antrum. Other paranasal sinuses are clear. Orbits appear symmetric bilaterally. CT CERVICAL SPINE FINDINGS There  is no demonstrable acute fracture. There is just over 3 mm of retrolisthesis of C4 on C5. There is no other spondylolisthesis. Prevertebral soft tissues and predental space regions are normal. There is severe disc space narrowing at C3-4, C4-5, C5-6, and C6-7. There is moderate disc space narrowing at C7-T1. There is facet hypertrophy at multiple levels bilaterally. There is carotid artery calcification bilaterally. There is a sizable pleural effusion on the left. Calcification is noted in visualized innominate arteries. IMPRESSION: CT head: Atrophy with prior small basal ganglia region lacunar type infarcts and mild periventricular small vessel disease. No hemorrhage or acute infarct evident. No mass or midline shift. There are foci of arterial vascular calcification. There is a retention cyst in the left maxillary antrum. Bones somewhat osteoporotic. CT cervical spine: No fracture. Mild spondylolisthesis at C4-5, felt to be due to underlying spondylosis. Multilevel arthropathy. Calcification in multiple vessels including both carotid arteries. Sizable right pleural effusion noted. Electronically Signed   By: Lowella Grip III M.D.   On: 08/28/2015 10:01  Ct Cervical Spine Wo Contrast  Result Date: 08/28/2015 CLINICAL DATA:  Several recent falls. Chronic renal failure. Lethargy. EXAM: CT HEAD WITHOUT CONTRAST CT CERVICAL SPINE WITHOUT CONTRAST TECHNIQUE: Multidetector CT imaging of the head and cervical spine was performed following the standard protocol without intravenous contrast. Multiplanar CT image reconstructions of the cervical spine were also generated. COMPARISON:  Head CT April 02, 2013 FINDINGS: CT HEAD FINDINGS There is mild diffuse atrophy which is stable. There is no intracranial mass, hemorrhage, extra-axial fluid collection, or midline shift. There is mild patchy small vessel disease in the centra semiovale bilaterally. There are scattered punctate basal ganglia lacunar type infarcts,  stable. There is been a prior small infarct in the anterior limb of the right external capsule, stable. No acute infarct evident. Bony calvarium appears intact. Bones are osteoporotic, particularly in the medial sphenoid bone regions, stable. Mastoid air cells are clear. There is no hyperdense vessel. There is calcification in the carotid siphon and cavernous carotid artery regions. There is a retention cyst in the left maxillary antrum. Other paranasal sinuses are clear. Orbits appear symmetric bilaterally. CT CERVICAL SPINE FINDINGS There is no demonstrable acute fracture. There is just over 3 mm of retrolisthesis of C4 on C5. There is no other spondylolisthesis. Prevertebral soft tissues and predental space regions are normal. There is severe disc space narrowing at C3-4, C4-5, C5-6, and C6-7. There is moderate disc space narrowing at C7-T1. There is facet hypertrophy at multiple levels bilaterally. There is carotid artery calcification bilaterally. There is a sizable pleural effusion on the left. Calcification is noted in visualized innominate arteries. IMPRESSION: CT head: Atrophy with prior small basal ganglia region lacunar type infarcts and mild periventricular small vessel disease. No hemorrhage or acute infarct evident. No mass or midline shift. There are foci of arterial vascular  calcification. There is a retention cyst in the left maxillary antrum. Bones somewhat osteoporotic. CT cervical spine: No fracture. Mild spondylolisthesis at C4-5, felt to be due to underlying spondylosis. Multilevel arthropathy. Calcification in multiple vessels including both carotid arteries. Sizable right pleural effusion noted. Electronically Signed   By: Lowella Grip III M.D.   On: 08/28/2015 10:01  Dg Chest Port 1 View  Result Date: 08/29/2015 CLINICAL DATA:  Community acquired pneumonia. EXAM: PORTABLE CHEST 1 VIEW COMPARISON:  08/28/2015 FINDINGS: The patient is mildly rotated to the left, and multiple skin folds  are again noted. The cardiomediastinal silhouette is unchanged. Moderate left and small right pleural effusions are unchanged. Underlying left lung consolidation is unchanged, as are opacities throughout the right lung. No definite pneumothorax is identified. IMPRESSION: Unchanged left larger than right pleural effusions and bilateral lung opacities concerning for pneumonia. Electronically Signed   By: Logan Bores M.D.   On: 08/29/2015 07:11   Dg Chest Port 1 View  Result Date: 08/28/2015 CLINICAL DATA:  Shortness of breath and cough EXAM: PORTABLE CHEST 1 VIEW COMPARISON:  March 09, 2015 FINDINGS: Prominent skin folds are seen over the right upper and lateral chest. No pneumothorax. A moderate left pleural effusion and underlying opacity is stable. A new right effusion is noted. There is also opacity throughout the right mid and lower lung. No other interval changes. IMPRESSION: 1. Stable left moderate-sized pleural effusion and underlying opacity. 2. New right pleural effusion and underlying opacity. More diffuse opacity seen throughout the right mid and lower lungs suggestive of infiltrate. Recommend follow-up to resolution. Electronically Signed   By: Dorise Bullion III M.D   On: 08/28/2015 10:08  Dg Foot Complete Right  Result Date: 08/28/2015 CLINICAL DATA:  Chronic right foot numbness. EXAM: RIGHT FOOT COMPLETE - 3+ VIEW COMPARISON:  None. FINDINGS: There is no evidence of fracture or dislocation. There is no evidence of arthropathy or other focal bone abnormality. Vascular calcifications are noted. IMPRESSION: No acute abnormality seen in the right foot. Electronically Signed   By: Marijo Conception, M.D.   On: 08/28/2015 10:10  US Abdomen Limited Ruq  Result Date: 08/28/2015 CLINICAL DATA:  Elevated LFTs. EXAM: US ABDOMEN LIMITED - RIGHT UPPER QUADRANT COMPARISON:  Thoracentesis ultrasound 03/02/2015 and 02/14/2015. FINDINGS: Gallbladder: Gallbladder wall is thickened of, measuring 5 mm. There  is edema in the wall. Sludge is present within the gallbladder. More echogenic material is seen dependently with some shadowing. There is no sonographic Murphy sign. Common bile duct: Diameter: 4 mm, within normal limits Liver: No focal lesions are present.  Liver is of normal echotexture. Abdominal ascites and a right pleural effusion are noted. Incidental note is made of an atrophic echogenic kidney compatible with end-stage renal disease. IMPRESSION: 1. Normal appearance of the liver. 2. Gallbladder wall thickening and edema may be secondary to anasarca and congestive heart failure. 3. Layering sludge with probable small stones. 4. No sonographic Murphy sign. 5. Moderate ascites. 6. Large right pleural effusion. 7. Atrophic echogenic right kidney compatible with end-stage renal disease. Electronically Signed   By: San Morelle M.D.   On: 08/28/2015 16:54     STUDIES:  CT head/ c-spine 7/31>>> neg acute   CULTURES: BC x 2 7/31>>> E. Coli in blood.  ANTIBIOTICS: Rocephin 7/31>>>8/9 Azithro 7/31>>>8/4  SIGNIFICANT EVENTS:   LINES/TUBES: PIV  DISCUSSION: 82yo male with multiple medical problems including ESRD, chronic R effusion, AS, AFib admitted 7/31 with hypotension and ?sepsis.   ASSESSMENT /  PLAN:  PULMONARY Chronic R effusion - had thora 02/14/2015   X 1 liter/ glucose 106/ Wbc 160 with L >P,  cyt POS atypical cells  ? R sided CAP - NO recent hospitalization P:   Rocephin/azithro as above. Pulmonary hygiene. F/u CXR. Consider retap R effusion if symptomatic v pleur-x. Supplemental O2 as needed. DNI status confirmed.  CARDIOVASCULAR Hypotension - unclear etiology. ?sepsis r/t CAP v other source v cardiac etiology.  AFib - not on anticoagulation  P:  KVO IVF, already fluid overloaded. CVP if TLC is to be placed. On Neo 160 mcg but SBP is 125, will attempt to get down to 50 mcg if unable then will have to place a TLC. Hold home coreg. Echo pending. Change  parameters for SBP of 90, patient is already ESRD and runs low BP. Stress dose steroids. Midodrine 5 mg PO TID.  RENAL ESRD  Hypokalemia  P:   Renal holding off dialysis for now. F/u chem  Replace electrolytes as indicated.  GASTROINTESTINAL elevated alk phos/bili   P:   F/u LFT's  Abd u/s negative Diet as ordered.  HEMATOLOGIC Thrombocytopenia  P:  SCD's  F/u CBC   INFECTIOUS ?CAP  P:   Rocephin/azithro as above with stop date in place at 8 and 5 days. Pan culture  ENDOCRINE Hypothyroid   P:   Continue home synthroid   NEUROLOGIC Hx seizures  P:   Continue home lamictal   FAMILY  - Updates:  Patient updated bedside.  - Inter-disciplinary family meet or Palliative Care meeting due by:  8/7  The patient is critically ill with multiple organ systems failure and requires high complexity decision making for assessment and support, frequent evaluation and titration of therapies, application of advanced monitoring technologies and extensive interpretation of multiple databases.   Critical Care Time devoted to patient care services described in this note is  35  Minutes. This time reflects time of care of this signee Dr Jennet Maduro. This critical care time does not reflect procedure time, or teaching time or supervisory time of PA/NP/Med student/Med Resident etc but could involve care discussion time.  Rush Farmer, M.D. [facility] Pulmonary/Critical Care Medicine. Pager: [phone]. After hours pager: [phone].

## 2015-08-29 NOTE — Progress Notes (Signed)
Subjective:  Remains hypotensive on pressors- blood cultures positive for GNR (e coli and enterobacter) Objective Vital signs in last 24 hours: Vitals:   08/29/15 0545 08/29/15 0600 08/29/15 0700 08/29/15 0722  BP:  (!) 77/51 106/77   Pulse: 77 77 85   Resp: 16 16 17    Temp:    97.4 F (36.3 C)  TempSrc:    Oral  SpO2: 98% 97% 94%   Weight:      Height:       Weight change:   Intake/Output Summary (Last 24 hours) at 08/29/15 0808 Last data filed at 08/29/15 0600  Gross per 24 hour  Intake          1786.42 ml  Output                0 ml  Net          1786.42 ml   Dialyzes at [facility] MWF- 3 hours and 15 min EDW 64- getting close as OP. HD Bath 2K/2.25 ca, Dialyzer 160, Heparin no. Access AVF. 450 BFR- venofer 50 weekly, mircera 75 q 2 weeks- given 7/19- last hgb 10/4  On TUMS for binding and calcitriol 0.25- last ca=9.7, phos 3.6, PTH 77   Assessment/Plan: 82 year old WM with multiple medical problems including ESRD presenting with weakness and found to have hypotension beyond normal hypotension for him 1 Hypotension- thought to possibly be related to sepsis- now with positive blood cultures.  On pressors.  Got a liter of fluid but I would prefer he not get more given pitting edema do not feel he is that dry 2 ESRD: normally MWF at [facility] via AVF- there is no need again  for HD right at this time (last done Friday) and given his BP do not think he would tolerate.  Therefore, I will just observe him for needs of HD- likely will need tomorrow and will hope that is more hemodynamically stable at that time- if not, may need CRRT   3 Sepsis: placed on azithro and rocephin for possible pulmonary infection - now has positive blood cultures for GNR-per CCM- is a do not intubate 4. Anemia of ESRD: on mircera and venofer as OP- will be due mircera this week- hgb 10.1 5. Metabolic Bone Disease: normally on calcitriol and tums- hold for now until more stable  6. Hyponatremia- likely due to volume  overload- since not hypoxic and unstable will continue to hold off on HD    Giabella Duhart A    Labs: Basic Metabolic Panel:  Recent Labs Lab 08/28/15 0807 08/28/15 0835 08/29/15 0425  NA 136 138 132*  K 3.3* 3.3* 3.5  CL 98* 94* 95*  CO2 28  --  26  GLUCOSE 88 81 104*  BUN 28* 28* 34*  CREATININE 5.20* 5.00* 5.52*  CALCIUM 8.7*  --  8.3*   Liver Function Tests:  Recent Labs Lab 08/28/15 0807  AST 133*  ALT 38  ALKPHOS 332*  BILITOT 3.7*  PROT 5.7*  ALBUMIN 2.8*   No results for input(s): LIPASE, AMYLASE in the last 168 hours. No results for input(s): AMMONIA in the last 168 hours. CBC:  Recent Labs Lab 08/28/15 0807 08/28/15 0835 08/29/15 0425  WBC 5.8  --  8.0  NEUTROABS 5.0  --   --   HGB 9.5* 10.9* 10.1*  HCT 31.0* 32.0* 31.8*  MCV 107.6*  --  105.0*  PLT 50*  --  52*   Cardiac Enzymes:  Recent Labs Lab  08/28/15 1255 08/28/15 1836 08/29/15 0026  TROPONINI 0.09* 0.16* 0.20*   CBG:  Recent Labs Lab 08/28/15 0902  GLUCAP 87    Iron Studies: No results for input(s): IRON, TIBC, TRANSFERRIN, FERRITIN in the last 72 hours. Studies/Results: Ct Head Wo Contrast  Result Date: 08/28/2015 CLINICAL DATA:  Several recent falls. Chronic renal failure. Lethargy. EXAM: CT HEAD WITHOUT CONTRAST CT CERVICAL SPINE WITHOUT CONTRAST TECHNIQUE: Multidetector CT imaging of the head and cervical spine was performed following the standard protocol without intravenous contrast. Multiplanar CT image reconstructions of the cervical spine were also generated. COMPARISON:  Head CT April 02, 2013 FINDINGS: CT HEAD FINDINGS There is mild diffuse atrophy which is stable. There is no intracranial mass, hemorrhage, extra-axial fluid collection, or midline shift. There is mild patchy small vessel disease in the centra semiovale bilaterally. There are scattered punctate basal ganglia lacunar type infarcts, stable. There is been a prior small infarct in the anterior limb of  the right external capsule, stable. No acute infarct evident. Bony calvarium appears intact. Bones are osteoporotic, particularly in the medial sphenoid bone regions, stable. Mastoid air cells are clear. There is no hyperdense vessel. There is calcification in the carotid siphon and cavernous carotid artery regions. There is a retention cyst in the left maxillary antrum. Other paranasal sinuses are clear. Orbits appear symmetric bilaterally. CT CERVICAL SPINE FINDINGS There is no demonstrable acute fracture. There is just over 3 mm of retrolisthesis of C4 on C5. There is no other spondylolisthesis. Prevertebral soft tissues and predental space regions are normal. There is severe disc space narrowing at C3-4, C4-5, C5-6, and C6-7. There is moderate disc space narrowing at C7-T1. There is facet hypertrophy at multiple levels bilaterally. There is carotid artery calcification bilaterally. There is a sizable pleural effusion on the left. Calcification is noted in visualized innominate arteries. IMPRESSION: CT head: Atrophy with prior small basal ganglia region lacunar type infarcts and mild periventricular small vessel disease. No hemorrhage or acute infarct evident. No mass or midline shift. There are foci of arterial vascular calcification. There is a retention cyst in the left maxillary antrum. Bones somewhat osteoporotic. CT cervical spine: No fracture. Mild spondylolisthesis at C4-5, felt to be due to underlying spondylosis. Multilevel arthropathy. Calcification in multiple vessels including both carotid arteries. Sizable right pleural effusion noted. Electronically Signed   By: Lowella Grip III M.D.   On: 08/28/2015 10:01  Ct Cervical Spine Wo Contrast  Result Date: 08/28/2015 CLINICAL DATA:  Several recent falls. Chronic renal failure. Lethargy. EXAM: CT HEAD WITHOUT CONTRAST CT CERVICAL SPINE WITHOUT CONTRAST TECHNIQUE: Multidetector CT imaging of the head and cervical spine was performed following the  standard protocol without intravenous contrast. Multiplanar CT image reconstructions of the cervical spine were also generated. COMPARISON:  Head CT April 02, 2013 FINDINGS: CT HEAD FINDINGS There is mild diffuse atrophy which is stable. There is no intracranial mass, hemorrhage, extra-axial fluid collection, or midline shift. There is mild patchy small vessel disease in the centra semiovale bilaterally. There are scattered punctate basal ganglia lacunar type infarcts, stable. There is been a prior small infarct in the anterior limb of the right external capsule, stable. No acute infarct evident. Bony calvarium appears intact. Bones are osteoporotic, particularly in the medial sphenoid bone regions, stable. Mastoid air cells are clear. There is no hyperdense vessel. There is calcification in the carotid siphon and cavernous carotid artery regions. There is a retention cyst in the left maxillary antrum. Other paranasal sinuses are clear.  Orbits appear symmetric bilaterally. CT CERVICAL SPINE FINDINGS There is no demonstrable acute fracture. There is just over 3 mm of retrolisthesis of C4 on C5. There is no other spondylolisthesis. Prevertebral soft tissues and predental space regions are normal. There is severe disc space narrowing at C3-4, C4-5, C5-6, and C6-7. There is moderate disc space narrowing at C7-T1. There is facet hypertrophy at multiple levels bilaterally. There is carotid artery calcification bilaterally. There is a sizable pleural effusion on the left. Calcification is noted in visualized innominate arteries. IMPRESSION: CT head: Atrophy with prior small basal ganglia region lacunar type infarcts and mild periventricular small vessel disease. No hemorrhage or acute infarct evident. No mass or midline shift. There are foci of arterial vascular calcification. There is a retention cyst in the left maxillary antrum. Bones somewhat osteoporotic. CT cervical spine: No fracture. Mild spondylolisthesis at C4-5,  felt to be due to underlying spondylosis. Multilevel arthropathy. Calcification in multiple vessels including both carotid arteries. Sizable right pleural effusion noted. Electronically Signed   By: Lowella Grip III M.D.   On: 08/28/2015 10:01  Dg Chest Port 1 View  Result Date: 08/29/2015 CLINICAL DATA:  Community acquired pneumonia. EXAM: PORTABLE CHEST 1 VIEW COMPARISON:  08/28/2015 FINDINGS: The patient is mildly rotated to the left, and multiple skin folds are again noted. The cardiomediastinal silhouette is unchanged. Moderate left and small right pleural effusions are unchanged. Underlying left lung consolidation is unchanged, as are opacities throughout the right lung. No definite pneumothorax is identified. IMPRESSION: Unchanged left larger than right pleural effusions and bilateral lung opacities concerning for pneumonia. Electronically Signed   By: Logan Bores M.D.   On: 08/29/2015 07:11   Dg Chest Port 1 View  Result Date: 08/28/2015 CLINICAL DATA:  Shortness of breath and cough EXAM: PORTABLE CHEST 1 VIEW COMPARISON:  March 09, 2015 FINDINGS: Prominent skin folds are seen over the right upper and lateral chest. No pneumothorax. A moderate left pleural effusion and underlying opacity is stable. A new right effusion is noted. There is also opacity throughout the right mid and lower lung. No other interval changes. IMPRESSION: 1. Stable left moderate-sized pleural effusion and underlying opacity. 2. New right pleural effusion and underlying opacity. More diffuse opacity seen throughout the right mid and lower lungs suggestive of infiltrate. Recommend follow-up to resolution. Electronically Signed   By: Dorise Bullion III M.D   On: 08/28/2015 10:08  Dg Foot Complete Right  Result Date: 08/28/2015 CLINICAL DATA:  Chronic right foot numbness. EXAM: RIGHT FOOT COMPLETE - 3+ VIEW COMPARISON:  None. FINDINGS: There is no evidence of fracture or dislocation. There is no evidence of arthropathy  or other focal bone abnormality. Vascular calcifications are noted. IMPRESSION: No acute abnormality seen in the right foot. Electronically Signed   By: Marijo Conception, M.D.   On: 08/28/2015 10:10  US Abdomen Limited Ruq  Result Date: 08/28/2015 CLINICAL DATA:  Elevated LFTs. EXAM: US ABDOMEN LIMITED - RIGHT UPPER QUADRANT COMPARISON:  Thoracentesis ultrasound 03/02/2015 and 02/14/2015. FINDINGS: Gallbladder: Gallbladder wall is thickened of, measuring 5 mm. There is edema in the wall. Sludge is present within the gallbladder. More echogenic material is seen dependently with some shadowing. There is no sonographic Murphy sign. Common bile duct: Diameter: 4 mm, within normal limits Liver: No focal lesions are present.  Liver is of normal echotexture. Abdominal ascites and a right pleural effusion are noted. Incidental note is made of an atrophic echogenic kidney compatible with end-stage renal disease. IMPRESSION:  1. Normal appearance of the liver. 2. Gallbladder wall thickening and edema may be secondary to anasarca and congestive heart failure. 3. Layering sludge with probable small stones. 4. No sonographic Murphy sign. 5. Moderate ascites. 6. Large right pleural effusion. 7. Atrophic echogenic right kidney compatible with end-stage renal disease. Electronically Signed   By: San Morelle M.D.   On: 08/28/2015 16:54   Medications: Infusions: . phenylephrine (NEO-SYNEPHRINE) Adult infusion 160 mcg/min (08/29/15 0612)    Scheduled Medications: . antiseptic oral rinse  7 mL Mouth Rinse q12n4p  . azithromycin  500 mg Intravenous Q24H  . cefTRIAXone (ROCEPHIN)  IV  2 g Intravenous Q24H  . chlorhexidine  15 mL Mouth Rinse BID  . lamoTRIgine  100 mg Oral QHS  . lamoTRIgine  50 mg Oral Daily  . levothyroxine  75 mcg Oral QAC breakfast  . sertraline  25 mg Oral Daily    have reviewed scheduled and prn medications.  Physical Exam: General:somnolent, arouseable Heart: RRR Lungs: poor  effort Abdomen: soft, non tender Extremities: pitting peripheral edema Dialysis Access: right AVF-- aneurysmal but does not look infected    08/29/2015,8:08 AM  LOS: 1 day

## 2015-08-30 DIAGNOSIS — N189 Chronic kidney disease, unspecified: Secondary | ICD-10-CM

## 2015-08-30 LAB — CBC
HCT: 31.1 % — ABNORMAL LOW (ref 39.0–52.0)
Hemoglobin: 9.9 g/dL — ABNORMAL LOW (ref 13.0–17.0)
MCH: 32.8 pg (ref 26.0–34.0)
MCHC: 31.8 g/dL (ref 30.0–36.0)
MCV: 103 fL — ABNORMAL HIGH (ref 78.0–100.0)
Platelets: 51 10*3/uL — ABNORMAL LOW (ref 150–400)
RBC: 3.02 MIL/uL — ABNORMAL LOW (ref 4.22–5.81)
RDW: 16.4 % — AB (ref 11.5–15.5)
WBC: 5.6 10*3/uL (ref 4.0–10.5)

## 2015-08-30 LAB — PHOSPHORUS: PHOSPHORUS: 4.9 mg/dL — AB (ref 2.5–4.6)

## 2015-08-30 LAB — BASIC METABOLIC PANEL
ANION GAP: 13 (ref 5–15)
BUN: 41 mg/dL — ABNORMAL HIGH (ref 6–20)
CALCIUM: 8.2 mg/dL — AB (ref 8.9–10.3)
CO2: 24 mmol/L (ref 22–32)
CREATININE: 6 mg/dL — AB (ref 0.61–1.24)
Chloride: 92 mmol/L — ABNORMAL LOW (ref 101–111)
GFR, EST AFRICAN AMERICAN: 9 mL/min — AB (ref >60)
GFR, EST NON AFRICAN AMERICAN: 8 mL/min — AB (ref >60)
Glucose, Bld: 163 mg/dL — ABNORMAL HIGH (ref 65–99)
Potassium: 3.9 mmol/L (ref 3.5–5.1)
SODIUM: 129 mmol/L — AB (ref 135–145)

## 2015-08-30 LAB — MAGNESIUM: MAGNESIUM: 2.1 mg/dL (ref 1.7–2.4)

## 2015-08-30 LAB — PROCALCITONIN: PROCALCITONIN: 19.03 ng/mL

## 2015-08-30 MED ORDER — ONDANSETRON HCL 4 MG/2ML IJ SOLN
4.0000 mg | Freq: Four times a day (QID) | INTRAMUSCULAR | Status: DC | PRN
  Administered 2015-08-30: 4 mg via INTRAVENOUS
  Filled 2015-08-30 (×2): qty 2

## 2015-08-30 MED ORDER — ALBUMIN HUMAN 25 % IV SOLN
25.0000 g | Freq: Once | INTRAVENOUS | Status: DC
  Filled 2015-08-30: qty 100

## 2015-08-30 MED ORDER — MIDODRINE HCL 5 MG PO TABS
10.0000 mg | ORAL_TABLET | Freq: Three times a day (TID) | ORAL | Status: DC
  Administered 2015-08-30 – 2015-08-31 (×3): 10 mg via ORAL
  Filled 2015-08-30 (×4): qty 2

## 2015-08-30 MED ORDER — PHENYLEPHRINE HCL 10 MG/ML IJ SOLN
25.0000 ug/min | INTRAVENOUS | Status: DC
  Administered 2015-08-30: 60 ug/min via INTRAVENOUS
  Administered 2015-08-31: 40 ug/min via INTRAVENOUS
  Filled 2015-08-30: qty 4

## 2015-08-30 NOTE — Progress Notes (Signed)
Speech Language Pathology Treatment: Dysphagia  Patient Details Name: [name] MRN: [id number] DOB: Jan 17, 1927 Today's Date: 08/30/2015 Time: [id number] SLP Time Calculation (min) (ACUTE ONLY): 10 min  Assessment / Plan / Recommendation Clinical Impression  Pt politely declined solids as he had just finished his breakfast tray, but was agreeable to finish thin liquids from his tray. He consumed several large, consecutive sips via straw without overt signs of aspiration. RN reports good tolerance of morning meal with assistance provided for cutting foods and clearing solids from oral cavity before taking more; this seems consistent with appearance yesterday morning. Recommend to continue current diet with continued SLP f/u for tolerance.   HPI HPI: Pt is an 82 y.o. male with PMH of AS, Afib, ESRD, HTN, hx Guillain-Barre presented 7/31 with c/o weakness, malaise and fall at home. CXR showed stable L moderate-size pleural effusion and underlying opacity, new R pleural effusion and underlying opacity, mor diffuse opacity seen throughout R mid and lower lungs suggestive of infiltrate. Bedside swallow eval ordered due to concerns for aspiration- RN swallow screen performed with history of dysphagia listed; however, no previous SLP visit noted in chart review.      SLP Plan  Continue with current plan of care     Recommendations  Diet recommendations: Dysphagia 3 (mechanical soft);Thin liquid Liquids provided via: Cup;No straw Medication Administration: Whole meds with puree Supervision: Staff to assist with self feeding;Full supervision/cueing for compensatory strategies Compensations: Slow rate;Small sips/bites;Follow solids with liquid Postural Changes and/or Swallow Maneuvers: Seated upright 90 degrees;Upright 30-60 min after meal             Oral Care Recommendations: Oral care BID;Oral care before and after PO Follow up Recommendations: Other (comment) (tba) Plan: Continue with current  plan of care     GO                [name] 08/30/2015, 9:59 AM  [name], M.A. CCC-SLP [phone]

## 2015-08-30 NOTE — Procedures (Signed)
Patient was seen on dialysis and the procedure was supervised.  BFR 300  Via AVF BP is  93/43.   Patient appears to be tolerating treatment well- initially BP was low so was hesitant to place on HD-but seems to have rallied- have taken a liter off so far- nursing watching him closely   Dorrie Cocuzza A 08/30/2015

## 2015-08-30 NOTE — Progress Notes (Signed)
PT Cancellation Note  Patient Details Name: [name] MRN: [id number] DOB: January 25, 1927   Cancelled Treatment:    Reason Eval/Treat Not Completed: Patient not medically ready (active bedrest orders)   Duncan Dull 08/30/2015, 7:26 AM Alben Deeds, PT DPT

## 2015-08-30 NOTE — Progress Notes (Signed)
PULMONARY / CRITICAL CARE MEDICINE   Name: [name] MRN: [id number] DOB: January 16, 1927    ADMISSION DATE:  08/28/2015  REFERRING MD:  EDP (knapp)   CHIEF COMPLAINT:  Hypotension, sepsis   HISTORY OF PRESENT ILLNESS:   82yo male with multiple medical problems including AS, Afib, ESRD, HTN, hx Guillain-Barre presented 7/31 with c/o weakness, malaise and fall at home.  In ER had progressive hypotension despite volume and chronic R sided effusion (previously thought ?malignant) and ?infiltrate on CXR.  PCCM called for ICU admit.   Currently denies pain, SOB, chest pain.  Still "feels bad".  Denies noted fevers, chills, sick contacts, n/v/d.  Some mild increased BLE edema over the last week.  Usually gets HD m/w/f - was due today.   SUBJECTIVE: Remains hypotensive and needing pressors.  VITAL SIGNS: BP (!) 82/52   Pulse 89   Temp 97.5 F (36.4 C) (Oral)   Resp 16   Ht 5' 9"  (1.753 m)   Wt 72.5 kg (159 lb 13.3 oz)   SpO2 97%   BMI 23.60 kg/m   HEMODYNAMICS:    VENTILATOR SETTINGS:    INTAKE / OUTPUT: I/O last 3 completed shifts: In: 2583.2 [P.O.:240; I.V.:1833.2; Other:210; IV Piggyback:300] Out: -   PHYSICAL EXAMINATION: General:  Pleasant but chronically ill appearing male, NAD  Neuro:  Awake, alert, moving all ext to command. HEENT:  Mm dry, no JVD. Cardiovascular:  s1s2 rrr, +murmur. Lungs:  resps even non labored on Superior, diminished R>L, few scattered rhonchi  Abdomen:  Round, soft, non tender, +bs  Musculoskeletal:  Warm and dry, 1+BLE edema, L arm AV fistula   LABS:  BMET  Recent Labs Lab 08/28/15 0807 08/28/15 0835 08/29/15 0425 08/30/15 0422  NA 136 138 132* 129*  K 3.3* 3.3* 3.5 3.9  CL 98* 94* 95* 92*  CO2 28  --  26 24  BUN 28* 28* 34* 41*  CREATININE 5.20* 5.00* 5.52* 6.00*  GLUCOSE 88 81 104* 163*   Electrolytes  Recent Labs Lab 08/28/15 0807 08/29/15 0425 08/30/15 0422  CALCIUM 8.7* 8.3* 8.2*  MG  --   --  2.1  PHOS  --   --  4.9*    CBC  Recent Labs Lab 08/28/15 0807 08/28/15 0835 08/29/15 0425 08/30/15 0422  WBC 5.8  --  8.0 5.6  HGB 9.5* 10.9* 10.1* 9.9*  HCT 31.0* 32.0* 31.8* 31.1*  PLT 50*  --  52* 51*   Coag's  Recent Labs Lab 08/28/15 0807  INR 1.29    Sepsis Markers  Recent Labs Lab 08/28/15 1255 08/28/15 1259 08/28/15 1836 08/28/15 2054  LATICACIDVEN  --  1.73 1.9 1.8  PROCALCITON 14.64  --   --   --     ABG  Recent Labs Lab 08/28/15 0858  PHART 7.530*  PCO2ART 38.2  PO2ART 265.0*    Liver Enzymes  Recent Labs Lab 08/28/15 0807 08/29/15 0930  AST 133* 97*  ALT 38 26  ALKPHOS 332* 249*  BILITOT 3.7* 2.6*  ALBUMIN 2.8* 2.5*    Cardiac Enzymes  Recent Labs Lab 08/28/15 1255 08/28/15 1836 08/29/15 0026  TROPONINI 0.09* 0.16* 0.20*    Glucose  Recent Labs Lab 08/28/15 0902  GLUCAP 87    Imaging Dg Chest Port 1 View  Result Date: 08/29/2015 CLINICAL DATA:  82 year old male central line placement. Initial encounter. EXAM: PORTABLE CHEST 1 VIEW COMPARISON:  0347 hours today and earlier. FINDINGS: Portable AP semi upright view at 1526 hours.  Right IJ approach central line has been placed. Tip projects at the level of the cavoatrial junction. No pneumothorax. Worsening right lung ventilation with increased veiling opacity felt related to pleural effusion. Continued confluent opacification of the left lower lung. Stable cardiomegaly and visible mediastinal contours. Calcified aortic atherosclerosis. IMPRESSION: 1. Right IJ approach central line placed, tip at the cavoatrial junction level. No pneumothorax. 2. Bilateral pleural effusions with bibasilar collapse or consolidation, and some interval worsening of right lung base ventilation. Electronically Signed   By: Genevie Ann M.D.   On: 08/29/2015 16:00   STUDIES:  CT head/ c-spine 7/31>>> neg acute   CULTURES: BC x 2 7/31>>> E. Coli in blood.  ANTIBIOTICS: Rocephin 7/31>>>8/9 Azithro 7/31>>>8/4  SIGNIFICANT  EVENTS:   LINES/TUBES: PIV  DISCUSSION: 82yo male with multiple medical problems including ESRD, chronic R effusion, AS, AFib admitted 7/31 with hypotension and ?sepsis.   ASSESSMENT / PLAN:  PULMONARY Chronic R effusion - had thora 02/14/2015   X 1 liter/ glucose 106/ Wbc 160 with L >P,  cyt POS atypical cells  ? R sided CAP - NO recent hospitalization P:   Rocephin/azithro as above. Pulmonary hygiene. Consider retap R effusion if symptomatic v pleur-x. Supplemental O2 as needed. DNR status confirmed.  CARDIOVASCULAR Hypotension - unclear etiology. ?sepsis r/t CAP v other source v cardiac etiology.  AFib - not on anticoagulation  P:  KVO IVF, already fluid overloaded. CVP if TLC is to be placed. On Neo 70 mcg, unable to titrate off. Hold home coreg. Echo pending. Change parameters for SBP of 90, patient is already ESRD and runs low BP. Stress dose steroids. Increase Midodrine 10 mg PO TID.  RENAL ESRD  Hypokalemia  P:   HD today. F/u chem. Replace electrolytes as indicated.  GASTROINTESTINAL elevated alk phos/bili   P:   F/u LFT's. Abd u/s negative. Diet as ordered.  HEMATOLOGIC Thrombocytopenia  P:  SCD's  F/u CBC   INFECTIOUS ?CAP  P:   Rocephin/azithro as above with stop date in place at 8 and 5 days. Pan culture Recheck procalcitonin today, doubt infection is source of hypotension here.  ENDOCRINE Hypothyroid   P:   Continue home synthroid   NEUROLOGIC Hx seizures  P:   Continue home lamictal   FAMILY  - Updates:  Patient updated bedside, will continue pressors for now, HD today.  - Inter-disciplinary family meet or Palliative Care meeting due by:  8/7  The patient is critically ill with multiple organ systems failure and requires high complexity decision making for assessment and support, frequent evaluation and titration of therapies, application of advanced monitoring technologies and extensive interpretation of multiple databases.    Critical Care Time devoted to patient care services described in this note is  35  Minutes. This time reflects time of care of this signee Dr Jennet Maduro. This critical care time does not reflect procedure time, or teaching time or supervisory time of PA/NP/Med student/Med Resident etc but could involve care discussion time.  Rush Farmer, M.D. [facility] Pulmonary/Critical Care Medicine. Pager: [phone]. After hours pager: [phone].

## 2015-08-30 NOTE — Progress Notes (Signed)
Subjective:  Remains hypotensive on pressors- he looks much better   Objective Vital signs in last 24 hours: Vitals:   08/30/15 0418 08/30/15 0700 08/30/15 0717 08/30/15 0800  BP:      Pulse:  64  79  Resp:  16  13  Temp: 97.1 F (36.2 C)  97.5 F (36.4 C)   TempSrc: Oral  Oral   SpO2:  100%  97%  Weight:      Height:       Weight change: 8.089 kg (17 lb 13.3 oz)  Intake/Output Summary (Last 24 hours) at 08/30/15 0831 Last data filed at 08/30/15 0400  Gross per 24 hour  Intake          1306.81 ml  Output                0 ml  Net          1306.81 ml   Dialyzes at [facility] MWF- 3 hours and 15 min EDW 64- getting close as OP. HD Bath 2K/2.25 ca, Dialyzer 160, Heparin no. Access AVF. 450 BFR- venofer 50 weekly, mircera 75 q 2 weeks- given 7/19- last hgb 10/4  On TUMS for binding and calcitriol 0.25- last ca=9.7, phos 3.6, PTH 77   Assessment/Plan: 82 year old WM with multiple medical problems including ESRD presenting with weakness and found to have hypotension beyond normal hypotension for him 1 Hypotension- thought to possibly be related to sepsis- now with 1 positive blood culture.  On pressors.  Seems to be making progress- he looks much better to me clinically and nurse thinking that pressors will be weaned today 2 ESRD: normally MWF at [facility] via AVF- missed Monday and given his BP did not think he would tolerate.  Still no absolute indications but is getting volume overloaded (8 kg over edw) so will attempt regular HD today- I think this is better option than placing another line and doing CRRT.  Nurse and I both think he will tolerate- normally BP does get down to the 80's on regular HD 3 Sepsis: placed on azithro and rocephin for possible pulmonary infection - now has positive blood cultures for GNR-per CCM- is a do not intubate 4. Anemia of ESRD: on mircera and venofer as OP- will be due mircera this week- hgb 9.9 5. Metabolic Bone Disease: normally on calcitriol and tums- hold for  now until more stable  6. Hyponatremia- likely due to volume overload- gentle volume removal with HD today    Sidrah Harden A    Labs: Basic Metabolic Panel:  Recent Labs Lab 08/28/15 0807 08/28/15 0835 08/29/15 0425 08/30/15 0422  NA 136 138 132* 129*  K 3.3* 3.3* 3.5 3.9  CL 98* 94* 95* 92*  CO2 28  --  26 24  GLUCOSE 88 81 104* 163*  BUN 28* 28* 34* 41*  CREATININE 5.20* 5.00* 5.52* 6.00*  CALCIUM 8.7*  --  8.3* 8.2*  PHOS  --   --   --  4.9*   Liver Function Tests:  Recent Labs Lab 08/28/15 0807 08/29/15 0930  AST 133* 97*  ALT 38 26  ALKPHOS 332* 249*  BILITOT 3.7* 2.6*  PROT 5.7* 5.4*  ALBUMIN 2.8* 2.5*   No results for input(s): LIPASE, AMYLASE in the last 168 hours. No results for input(s): AMMONIA in the last 168 hours. CBC:  Recent Labs Lab 08/28/15 0807 08/28/15 0835 08/29/15 0425 08/30/15 0422  WBC 5.8  --  8.0 5.6  NEUTROABS 5.0  --   --   --  HGB 9.5* 10.9* 10.1* 9.9*  HCT 31.0* 32.0* 31.8* 31.1*  MCV 107.6*  --  105.0* 103.0*  PLT 50*  --  52* 51*   Cardiac Enzymes:  Recent Labs Lab 08/28/15 1255 08/28/15 1836 08/29/15 0026  TROPONINI 0.09* 0.16* 0.20*   CBG:  Recent Labs Lab 08/28/15 0902  GLUCAP 87    Iron Studies: No results for input(s): IRON, TIBC, TRANSFERRIN, FERRITIN in the last 72 hours. Studies/Results: Ct Head Wo Contrast  Result Date: 08/28/2015 CLINICAL DATA:  Several recent falls. Chronic renal failure. Lethargy. EXAM: CT HEAD WITHOUT CONTRAST CT CERVICAL SPINE WITHOUT CONTRAST TECHNIQUE: Multidetector CT imaging of the head and cervical spine was performed following the standard protocol without intravenous contrast. Multiplanar CT image reconstructions of the cervical spine were also generated. COMPARISON:  Head CT April 02, 2013 FINDINGS: CT HEAD FINDINGS There is mild diffuse atrophy which is stable. There is no intracranial mass, hemorrhage, extra-axial fluid collection, or midline shift. There is  mild patchy small vessel disease in the centra semiovale bilaterally. There are scattered punctate basal ganglia lacunar type infarcts, stable. There is been a prior small infarct in the anterior limb of the right external capsule, stable. No acute infarct evident. Bony calvarium appears intact. Bones are osteoporotic, particularly in the medial sphenoid bone regions, stable. Mastoid air cells are clear. There is no hyperdense vessel. There is calcification in the carotid siphon and cavernous carotid artery regions. There is a retention cyst in the left maxillary antrum. Other paranasal sinuses are clear. Orbits appear symmetric bilaterally. CT CERVICAL SPINE FINDINGS There is no demonstrable acute fracture. There is just over 3 mm of retrolisthesis of C4 on C5. There is no other spondylolisthesis. Prevertebral soft tissues and predental space regions are normal. There is severe disc space narrowing at C3-4, C4-5, C5-6, and C6-7. There is moderate disc space narrowing at C7-T1. There is facet hypertrophy at multiple levels bilaterally. There is carotid artery calcification bilaterally. There is a sizable pleural effusion on the left. Calcification is noted in visualized innominate arteries. IMPRESSION: CT head: Atrophy with prior small basal ganglia region lacunar type infarcts and mild periventricular small vessel disease. No hemorrhage or acute infarct evident. No mass or midline shift. There are foci of arterial vascular calcification. There is a retention cyst in the left maxillary antrum. Bones somewhat osteoporotic. CT cervical spine: No fracture. Mild spondylolisthesis at C4-5, felt to be due to underlying spondylosis. Multilevel arthropathy. Calcification in multiple vessels including both carotid arteries. Sizable right pleural effusion noted. Electronically Signed   By: Lowella Grip III M.D.   On: 08/28/2015 10:01  Ct Cervical Spine Wo Contrast  Result Date: 08/28/2015 CLINICAL DATA:  Several  recent falls. Chronic renal failure. Lethargy. EXAM: CT HEAD WITHOUT CONTRAST CT CERVICAL SPINE WITHOUT CONTRAST TECHNIQUE: Multidetector CT imaging of the head and cervical spine was performed following the standard protocol without intravenous contrast. Multiplanar CT image reconstructions of the cervical spine were also generated. COMPARISON:  Head CT April 02, 2013 FINDINGS: CT HEAD FINDINGS There is mild diffuse atrophy which is stable. There is no intracranial mass, hemorrhage, extra-axial fluid collection, or midline shift. There is mild patchy small vessel disease in the centra semiovale bilaterally. There are scattered punctate basal ganglia lacunar type infarcts, stable. There is been a prior small infarct in the anterior limb of the right external capsule, stable. No acute infarct evident. Bony calvarium appears intact. Bones are osteoporotic, particularly in the medial sphenoid bone regions, stable. Mastoid air  cells are clear. There is no hyperdense vessel. There is calcification in the carotid siphon and cavernous carotid artery regions. There is a retention cyst in the left maxillary antrum. Other paranasal sinuses are clear. Orbits appear symmetric bilaterally. CT CERVICAL SPINE FINDINGS There is no demonstrable acute fracture. There is just over 3 mm of retrolisthesis of C4 on C5. There is no other spondylolisthesis. Prevertebral soft tissues and predental space regions are normal. There is severe disc space narrowing at C3-4, C4-5, C5-6, and C6-7. There is moderate disc space narrowing at C7-T1. There is facet hypertrophy at multiple levels bilaterally. There is carotid artery calcification bilaterally. There is a sizable pleural effusion on the left. Calcification is noted in visualized innominate arteries. IMPRESSION: CT head: Atrophy with prior small basal ganglia region lacunar type infarcts and mild periventricular small vessel disease. No hemorrhage or acute infarct evident. No mass or midline  shift. There are foci of arterial vascular calcification. There is a retention cyst in the left maxillary antrum. Bones somewhat osteoporotic. CT cervical spine: No fracture. Mild spondylolisthesis at C4-5, felt to be due to underlying spondylosis. Multilevel arthropathy. Calcification in multiple vessels including both carotid arteries. Sizable right pleural effusion noted. Electronically Signed   By: Lowella Grip III M.D.   On: 08/28/2015 10:01  Dg Chest Port 1 View  Result Date: 08/29/2015 CLINICAL DATA:  82 year old male central line placement. Initial encounter. EXAM: PORTABLE CHEST 1 VIEW COMPARISON:  0347 hours today and earlier. FINDINGS: Portable AP semi upright view at 1526 hours. Right IJ approach central line has been placed. Tip projects at the level of the cavoatrial junction. No pneumothorax. Worsening right lung ventilation with increased veiling opacity felt related to pleural effusion. Continued confluent opacification of the left lower lung. Stable cardiomegaly and visible mediastinal contours. Calcified aortic atherosclerosis. IMPRESSION: 1. Right IJ approach central line placed, tip at the cavoatrial junction level. No pneumothorax. 2. Bilateral pleural effusions with bibasilar collapse or consolidation, and some interval worsening of right lung base ventilation. Electronically Signed   By: Genevie Ann M.D.   On: 08/29/2015 16:00   Dg Chest Port 1 View  Result Date: 08/29/2015 CLINICAL DATA:  Community acquired pneumonia. EXAM: PORTABLE CHEST 1 VIEW COMPARISON:  08/28/2015 FINDINGS: The patient is mildly rotated to the left, and multiple skin folds are again noted. The cardiomediastinal silhouette is unchanged. Moderate left and small right pleural effusions are unchanged. Underlying left lung consolidation is unchanged, as are opacities throughout the right lung. No definite pneumothorax is identified. IMPRESSION: Unchanged left larger than right pleural effusions and bilateral lung  opacities concerning for pneumonia. Electronically Signed   By: Logan Bores M.D.   On: 08/29/2015 07:11   Dg Chest Port 1 View  Result Date: 08/28/2015 CLINICAL DATA:  Shortness of breath and cough EXAM: PORTABLE CHEST 1 VIEW COMPARISON:  March 09, 2015 FINDINGS: Prominent skin folds are seen over the right upper and lateral chest. No pneumothorax. A moderate left pleural effusion and underlying opacity is stable. A new right effusion is noted. There is also opacity throughout the right mid and lower lung. No other interval changes. IMPRESSION: 1. Stable left moderate-sized pleural effusion and underlying opacity. 2. New right pleural effusion and underlying opacity. More diffuse opacity seen throughout the right mid and lower lungs suggestive of infiltrate. Recommend follow-up to resolution. Electronically Signed   By: Dorise Bullion III M.D   On: 08/28/2015 10:08  Dg Foot Complete Right  Result Date: 08/28/2015 CLINICAL DATA:  Chronic right foot numbness. EXAM: RIGHT FOOT COMPLETE - 3+ VIEW COMPARISON:  None. FINDINGS: There is no evidence of fracture or dislocation. There is no evidence of arthropathy or other focal bone abnormality. Vascular calcifications are noted. IMPRESSION: No acute abnormality seen in the right foot. Electronically Signed   By: Marijo Conception, M.D.   On: 08/28/2015 10:10  US Abdomen Limited Ruq  Result Date: 08/28/2015 CLINICAL DATA:  Elevated LFTs. EXAM: US ABDOMEN LIMITED - RIGHT UPPER QUADRANT COMPARISON:  Thoracentesis ultrasound 03/02/2015 and 02/14/2015. FINDINGS: Gallbladder: Gallbladder wall is thickened of, measuring 5 mm. There is edema in the wall. Sludge is present within the gallbladder. More echogenic material is seen dependently with some shadowing. There is no sonographic Murphy sign. Common bile duct: Diameter: 4 mm, within normal limits Liver: No focal lesions are present.  Liver is of normal echotexture. Abdominal ascites and a right pleural effusion are  noted. Incidental note is made of an atrophic echogenic kidney compatible with end-stage renal disease. IMPRESSION: 1. Normal appearance of the liver. 2. Gallbladder wall thickening and edema may be secondary to anasarca and congestive heart failure. 3. Layering sludge with probable small stones. 4. No sonographic Murphy sign. 5. Moderate ascites. 6. Large right pleural effusion. 7. Atrophic echogenic right kidney compatible with end-stage renal disease. Electronically Signed   By: San Morelle M.D.   On: 08/28/2015 16:54   Medications: Infusions: . phenylephrine (NEO-SYNEPHRINE) Adult infusion 70.133 mcg/min (08/30/15 0400)    Scheduled Medications: . antiseptic oral rinse  7 mL Mouth Rinse q12n4p  . azithromycin  500 mg Intravenous Q24H  . cefTRIAXone (ROCEPHIN)  IV  2 g Intravenous Q24H  . chlorhexidine  15 mL Mouth Rinse BID  . hydrocortisone sodium succinate  50 mg Intravenous Q6H  . lamoTRIgine  100 mg Oral QHS  . lamoTRIgine  50 mg Oral Daily  . levothyroxine  75 mcg Oral QAC breakfast  . midodrine  5 mg Oral TID WC  . sertraline  25 mg Oral Daily    have reviewed scheduled and prn medications.  Physical Exam: General:somnolent, arouseable Heart: RRR Lungs: poor effort Abdomen: soft, non tender Extremities: pitting peripheral edema Dialysis Access: left AVF-- aneurysmal but does not look infected    08/30/2015,8:31 AM  LOS: 2 days

## 2015-08-31 DIAGNOSIS — Z992 Dependence on renal dialysis: Secondary | ICD-10-CM

## 2015-08-31 DIAGNOSIS — N186 End stage renal disease: Secondary | ICD-10-CM

## 2015-08-31 LAB — CBC
HCT: 32.8 % — ABNORMAL LOW (ref 39.0–52.0)
HEMOGLOBIN: 10.3 g/dL — AB (ref 13.0–17.0)
MCH: 33.1 pg (ref 26.0–34.0)
MCHC: 31.4 g/dL (ref 30.0–36.0)
MCV: 105.5 fL — ABNORMAL HIGH (ref 78.0–100.0)
Platelets: 70 10*3/uL — ABNORMAL LOW (ref 150–400)
RBC: 3.11 MIL/uL — ABNORMAL LOW (ref 4.22–5.81)
RDW: 16.8 % — AB (ref 11.5–15.5)
WBC: 7.5 10*3/uL (ref 4.0–10.5)

## 2015-08-31 LAB — BASIC METABOLIC PANEL
Anion gap: 10 (ref 5–15)
BUN: 23 mg/dL — ABNORMAL HIGH (ref 6–20)
CALCIUM: 8.3 mg/dL — AB (ref 8.9–10.3)
CHLORIDE: 93 mmol/L — AB (ref 101–111)
CO2: 28 mmol/L (ref 22–32)
CREATININE: 3.88 mg/dL — AB (ref 0.61–1.24)
GFR, EST AFRICAN AMERICAN: 15 mL/min — AB (ref >60)
GFR, EST NON AFRICAN AMERICAN: 13 mL/min — AB (ref >60)
Glucose, Bld: 116 mg/dL — ABNORMAL HIGH (ref 65–99)
Potassium: 3.7 mmol/L (ref 3.5–5.1)
SODIUM: 131 mmol/L — AB (ref 135–145)

## 2015-08-31 LAB — PROCALCITONIN: PROCALCITONIN: 16.7 ng/mL

## 2015-08-31 LAB — MAGNESIUM: MAGNESIUM: 1.9 mg/dL (ref 1.7–2.4)

## 2015-08-31 LAB — PHOSPHORUS: PHOSPHORUS: 3.9 mg/dL (ref 2.5–4.6)

## 2015-08-31 MED ORDER — MAGNESIUM SULFATE 2 GM/50ML IV SOLN
2.0000 g | Freq: Once | INTRAVENOUS | Status: AC
  Administered 2015-08-31: 2 g via INTRAVENOUS
  Filled 2015-08-31: qty 50

## 2015-08-31 MED ORDER — MIDODRINE HCL 5 MG PO TABS
10.0000 mg | ORAL_TABLET | Freq: Four times a day (QID) | ORAL | Status: DC
  Administered 2015-08-31 – 2015-09-12 (×38): 10 mg via ORAL
  Filled 2015-08-31 (×46): qty 2

## 2015-08-31 NOTE — Progress Notes (Signed)
PT Cancellation Note  Patient Details Name: NYCERE RENY MRN: [id number] DOB: 02-Mar-1926   Cancelled Treatment:    Reason Eval/Treat Not Completed: Patient not medically ready.  Pt currently with active bedrest orders and still on pressors.  Please advise when pt becomes appropriate for PT and mobility.     Catarina Hartshorn, Wood 08/31/2015, 8:09 AM

## 2015-08-31 NOTE — Progress Notes (Signed)
Subjective:  Able to get regular HD yesterday removed 2 liters in spite of hypotension on pressors- pressors being weaned this AM.  Groggy- just woke up  Objective Vital signs in last 24 hours: Vitals:   08/31/15 0500 08/31/15 0530 08/31/15 0600 08/31/15 0630  BP: 109/65 (!) 124/48 124/65 127/65  Pulse: 61 (!) 57 70 68  Resp: (!) 9 10 11  (!) 9  Temp:      TempSrc:      SpO2: 99% 99% 99% 99%  Weight: 71.6 kg (157 lb 13.6 oz)     Height:       Weight change: -2 kg (-4 lb 6.6 oz)  Intake/Output Summary (Last 24 hours) at 08/31/15 0703 Last data filed at 08/31/15 0500  Gross per 24 hour  Intake            872.2 ml  Output             2000 ml  Net          -1127.8 ml   Dialyzes at [facility] MWF- 3 hours and 15 min EDW 64- getting close as OP. HD Bath 2K/2.25 ca, Dialyzer 160, Heparin no. Access AVF. 450 BFR- venofer 50 weekly, mircera 75 q 2 weeks- given 7/19- last hgb 10/4  On TUMS for binding and calcitriol 0.25- last ca=9.7, phos 3.6, PTH 77   Assessment/Plan: 82 year old WM with multiple medical problems including ESRD presenting with weakness and found to have hypotension beyond normal hypotension for him 1 Hypotension- thought to possibly be related to sepsis- now with 1 positive blood culture.  On pressors.  Seems to be making progress- he looks better to me clinically and nurse thinking that pressors will be weaned today 2 ESRD: normally MWF at [facility] via AVF- missed Monday and given his BP did not think he would tolerate.  did tolerate regular HD yesterday.   Normally BP does get down to the 80's on regular HD.  Will plan for another HD tomorrow on schedule 3 Sepsis: placed on azithro and rocephin for possible pulmonary infection - now has positive blood cultures for GNR-per CCM- is a do not intubate- clinically improving 4. Anemia of ESRD: on mircera and venofer as OP- will be due mircera this week- hgb 9.9 5. Metabolic Bone Disease: normally on calcitriol and tums- hold for now until  more stable  6. Hyponatremia- due to volume overload- some removal with HD yest   Ame Heagle A    Labs: Basic Metabolic Panel:  Recent Labs Lab 08/28/15 0807 08/28/15 0835 08/29/15 0425 08/30/15 0422  NA 136 138 132* 129*  K 3.3* 3.3* 3.5 3.9  CL 98* 94* 95* 92*  CO2 28  --  26 24  GLUCOSE 88 81 104* 163*  BUN 28* 28* 34* 41*  CREATININE 5.20* 5.00* 5.52* 6.00*  CALCIUM 8.7*  --  8.3* 8.2*  PHOS  --   --   --  4.9*   Liver Function Tests:  Recent Labs Lab 08/28/15 0807 08/29/15 0930  AST 133* 97*  ALT 38 26  ALKPHOS 332* 249*  BILITOT 3.7* 2.6*  PROT 5.7* 5.4*  ALBUMIN 2.8* 2.5*   No results for input(s): LIPASE, AMYLASE in the last 168 hours. No results for input(s): AMMONIA in the last 168 hours. CBC:  Recent Labs Lab 08/28/15 0807 08/28/15 0835 08/29/15 0425 08/30/15 0422  WBC 5.8  --  8.0 5.6  NEUTROABS 5.0  --   --   --  HGB 9.5* 10.9* 10.1* 9.9*  HCT 31.0* 32.0* 31.8* 31.1*  MCV 107.6*  --  105.0* 103.0*  PLT 50*  --  52* 51*   Cardiac Enzymes:  Recent Labs Lab 08/28/15 1255 08/28/15 1836 08/29/15 0026  TROPONINI 0.09* 0.16* 0.20*   CBG:  Recent Labs Lab 08/28/15 0902  GLUCAP 87    Iron Studies: No results for input(s): IRON, TIBC, TRANSFERRIN, FERRITIN in the last 72 hours. Studies/Results: Dg Chest Port 1 View  Result Date: 08/29/2015 CLINICAL DATA:  82 year old male central line placement. Initial encounter. EXAM: PORTABLE CHEST 1 VIEW COMPARISON:  0347 hours today and earlier. FINDINGS: Portable AP semi upright view at 1526 hours. Right IJ approach central line has been placed. Tip projects at the level of the cavoatrial junction. No pneumothorax. Worsening right lung ventilation with increased veiling opacity felt related to pleural effusion. Continued confluent opacification of the left lower lung. Stable cardiomegaly and visible mediastinal contours. Calcified aortic atherosclerosis. IMPRESSION: 1. Right IJ approach  central line placed, tip at the cavoatrial junction level. No pneumothorax. 2. Bilateral pleural effusions with bibasilar collapse or consolidation, and some interval worsening of right lung base ventilation. Electronically Signed   By: Genevie Ann M.D.   On: 08/29/2015 16:00   Medications: Infusions: . phenylephrine (NEO-SYNEPHRINE) Adult infusion 30 mcg/min (08/31/15 0353)    Scheduled Medications: . albumin human  25 g Intravenous Once  . antiseptic oral rinse  7 mL Mouth Rinse q12n4p  . azithromycin  500 mg Intravenous Q24H  . cefTRIAXone (ROCEPHIN)  IV  2 g Intravenous Q24H  . chlorhexidine  15 mL Mouth Rinse BID  . hydrocortisone sodium succinate  50 mg Intravenous Q6H  . lamoTRIgine  100 mg Oral QHS  . lamoTRIgine  50 mg Oral Daily  . levothyroxine  75 mcg Oral QAC breakfast  . midodrine  10 mg Oral TID WC  . sertraline  25 mg Oral Daily    have reviewed scheduled and prn medications.  Physical Exam: General:somnolent, arouseable Heart: RRR Lungs: poor effort Abdomen: soft, non tender Extremities: pitting peripheral edema Dialysis Access: left AVF-- aneurysmal but does not look infected    08/31/2015,7:03 AM  LOS: 3 days

## 2015-08-31 NOTE — Progress Notes (Signed)
Patient very drowsy, states he is "really tired".  Unable to ambulate, MD made aware

## 2015-08-31 NOTE — Care Management Important Message (Signed)
Important Message  Patient Details  Name: [name] MRN: [id number] Date of Birth: 1926/11/07   Medicare Important Message Given:  Yes    Norvell Caswell Abena 08/31/2015, 10:09 AM

## 2015-08-31 NOTE — Progress Notes (Signed)
Right Arterial Line removed per Dr. Nelda Marseille.  No complications.

## 2015-08-31 NOTE — Progress Notes (Signed)
Pt wife notified about pt's transfer to 2c12

## 2015-08-31 NOTE — Progress Notes (Signed)
PULMONARY / CRITICAL CARE MEDICINE   Name: [name] MRN: [id number] DOB: 1926-12-12    ADMISSION DATE:  08/28/2015  REFERRING MD:  EDP (knapp)   CHIEF COMPLAINT:  Hypotension, sepsis   HISTORY OF PRESENT ILLNESS:   82yo male with multiple medical problems including AS, Afib, ESRD, HTN, hx Guillain-Barre presented 7/31 with c/o weakness, malaise and fall at home.  In ER had progressive hypotension despite volume and chronic R sided effusion (previously thought ?malignant) and ?infiltrate on CXR.  PCCM called for ICU admit.   Currently denies pain, SOB, chest pain.  Still "feels bad".  Denies noted fevers, chills, sick contacts, n/v/d.  Some mild increased BLE edema over the last week.  Usually gets HD m/w/f - was due today.   SUBJECTIVE: Remains hypotensive and needing pressors but demand is decreasing.  VITAL SIGNS: BP 104/62   Pulse 64   Temp 97.5 F (36.4 C) (Oral)   Resp 10   Ht 5' 9"  (1.753 m)   Wt 71.6 kg (157 lb 13.6 oz)   SpO2 99%   BMI 23.31 kg/m   HEMODYNAMICS:    VENTILATOR SETTINGS:    INTAKE / OUTPUT: I/O last 3 completed shifts: In: 1227.4 [P.O.:240; I.V.:797.4; Other:140; IV Piggyback:50] Out: 2000 [Other:2000]  PHYSICAL EXAMINATION: General:  Pleasant but chronically ill appearing male, NAD  Neuro:  Awake, alert, moving all ext to command. HEENT:  Mm dry, no JVD. Cardiovascular:  s1s2 rrr, +murmur. Lungs:  resps even non labored on [place], diminished R>L, few scattered rhonchi  Abdomen:  Round, soft, non tender, +bs  Musculoskeletal:  Warm and dry, 1+BLE edema, L arm AV fistula   LABS:  BMET  Recent Labs Lab 08/29/15 0425 08/30/15 0422 08/31/15 0720  NA 132* 129* 131*  K 3.5 3.9 3.7  CL 95* 92* 93*  CO2 26 24 28   BUN 34* 41* 23*  CREATININE 5.52* 6.00* 3.88*  GLUCOSE 104* 163* 116*   Electrolytes  Recent Labs Lab 08/29/15 0425 08/30/15 0422 08/31/15 0720  CALCIUM 8.3* 8.2* 8.3*  MG  --  2.1 1.9  PHOS  --  4.9* 3.9    CBC  Recent Labs Lab 08/29/15 0425 08/30/15 0422 08/31/15 0720  WBC 8.0 5.6 7.5  HGB 10.1* 9.9* 10.3*  HCT 31.8* 31.1* 32.8*  PLT 52* 51* 70*   Coag's  Recent Labs Lab 08/28/15 0807  INR 1.29   Sepsis Markers  Recent Labs Lab 08/28/15 1255 08/28/15 1259 08/28/15 1836 08/28/15 2054 08/30/15 1044 08/31/15 0720  LATICACIDVEN  --  1.73 1.9 1.8  --   --   PROCALCITON 14.64  --   --   --  19.03 16.70   ABG  Recent Labs Lab 08/28/15 0858  PHART 7.530*  PCO2ART 38.2  PO2ART 265.0*   Liver Enzymes  Recent Labs Lab 08/28/15 0807 08/29/15 0930  AST 133* 97*  ALT 38 26  ALKPHOS 332* 249*  BILITOT 3.7* 2.6*  ALBUMIN 2.8* 2.5*   Cardiac Enzymes  Recent Labs Lab 08/28/15 1255 08/28/15 1836 08/29/15 0026  TROPONINI 0.09* 0.16* 0.20*   Glucose  Recent Labs Lab 08/28/15 0902  GLUCAP 87   Imaging No results found. STUDIES:  CT head/ c-spine 7/31>>> neg acute   CULTURES: BC x 2 7/31>>> E. Coli in blood.  ANTIBIOTICS: Rocephin 7/31>>>8/9 Azithro 7/31>>>8/4  SIGNIFICANT EVENTS:   LINES/TUBES: PIV  DISCUSSION: 82yo male with multiple medical problems including ESRD, chronic R effusion, AS, AFib admitted 7/31 with hypotension and ?sepsis.  ASSESSMENT / PLAN:  PULMONARY Chronic R effusion - had thora 02/14/2015   X 1 liter/ glucose 106/ Wbc 160 with L >P,  cyt POS atypical cells  ? R sided CAP - NO recent hospitalization P:   Rocephin/azithro as above. Pulmonary hygiene. Supplemental O2 as needed. DNR status confirmed.  CARDIOVASCULAR Hypotension - unclear etiology. ?sepsis r/t CAP v other source v cardiac etiology.  AFib - not on anticoagulation  P:  KVO IVF, already fluid overloaded. On Neo 10 mcg, hope to d/c today. Hold home coreg. Change parameters for SBP of 90, patient is already ESRD and runs low BP. Stress dose steroids. Increase Midodrine 10 mg PO QID.  RENAL ESRD  Hypokalemia  P:   HD per renal. F/u  chem. Replace electrolytes as indicated.  GASTROINTESTINAL elevated alk phos/bili   P:   F/u LFT's. Abd u/s negative. Diet as ordered.  HEMATOLOGIC Thrombocytopenia  P:  SCD's  F/u CBC   INFECTIOUS ?CAP E.coli bacteremia  P:   Rocephin/azithro as above with stop date in place at 8 and 5 days respectively. Pan culture Procalcitonin seems to be chronically elevated.  ENDOCRINE Hypothyroid   P:   Continue home synthroid.  NEUROLOGIC Hx seizures  P:   Continue home lamictal   FAMILY  - Updates:  Patient updated bedside, will continue pressors for now, Hope to get off pressors today then transfer to SDU and to [facility] service with PCCM off 8/4.  - Inter-disciplinary family meet or Palliative Care meeting due by:  8/7  The patient is critically ill with multiple organ systems failure and requires high complexity decision making for assessment and support, frequent evaluation and titration of therapies, application of advanced monitoring technologies and extensive interpretation of multiple databases.   Critical Care Time devoted to patient care services described in this note is  35  Minutes. This time reflects time of care of this signee Dr Jennet Maduro. This critical care time does not reflect procedure time, or teaching time or supervisory time of PA/NP/Med student/Med Resident etc but could involve care discussion time.  Rush Farmer, M.D. [facility] Pulmonary/Critical Care Medicine. Pager: [phone]. After hours pager: [phone].

## 2015-08-31 NOTE — Progress Notes (Signed)
Patient refused bedtime PO meds: Lamotrigine and Midodrine. E-Link notified. Will monitor BP closely.

## 2015-09-01 LAB — CULTURE, BLOOD (ROUTINE X 2)

## 2015-09-01 LAB — CBC
HEMATOCRIT: 32.8 % — AB (ref 39.0–52.0)
HEMOGLOBIN: 10.4 g/dL — AB (ref 13.0–17.0)
MCH: 32.7 pg (ref 26.0–34.0)
MCHC: 31.7 g/dL (ref 30.0–36.0)
MCV: 103.1 fL — AB (ref 78.0–100.0)
Platelets: 69 10*3/uL — ABNORMAL LOW (ref 150–400)
RBC: 3.18 MIL/uL — ABNORMAL LOW (ref 4.22–5.81)
RDW: 16.4 % — AB (ref 11.5–15.5)
WBC: 5 10*3/uL (ref 4.0–10.5)

## 2015-09-01 LAB — BASIC METABOLIC PANEL
Anion gap: 13 (ref 5–15)
BUN: 29 mg/dL — AB (ref 6–20)
CHLORIDE: 92 mmol/L — AB (ref 101–111)
CO2: 27 mmol/L (ref 22–32)
CREATININE: 4.84 mg/dL — AB (ref 0.61–1.24)
Calcium: 8.5 mg/dL — ABNORMAL LOW (ref 8.9–10.3)
GFR calc Af Amer: 11 mL/min — ABNORMAL LOW (ref >60)
GFR calc non Af Amer: 10 mL/min — ABNORMAL LOW (ref >60)
GLUCOSE: 94 mg/dL (ref 65–99)
Potassium: 4.4 mmol/L (ref 3.5–5.1)
Sodium: 132 mmol/L — ABNORMAL LOW (ref 135–145)

## 2015-09-01 LAB — MAGNESIUM: Magnesium: 2.5 mg/dL — ABNORMAL HIGH (ref 1.7–2.4)

## 2015-09-01 LAB — PROCALCITONIN: PROCALCITONIN: 13.88 ng/mL

## 2015-09-01 LAB — PHOSPHORUS: PHOSPHORUS: 4.9 mg/dL — AB (ref 2.5–4.6)

## 2015-09-01 MED ORDER — HEPARIN SODIUM (PORCINE) 1000 UNIT/ML DIALYSIS: 20.0000 [IU]/kg | INTRAMUSCULAR | Status: DC | PRN

## 2015-09-01 MED ORDER — CALCIUM CARBONATE ANTACID 500 MG PO CHEW
1.0000 | CHEWABLE_TABLET | Freq: Three times a day (TID) | ORAL | Status: DC
  Administered 2015-09-01 – 2015-09-12 (×27): 200 mg via ORAL
  Filled 2015-09-01 (×28): qty 1

## 2015-09-01 MED ORDER — CALCITRIOL 0.25 MCG PO CAPS
0.2500 ug | ORAL_CAPSULE | ORAL | Status: DC
  Administered 2015-09-01: 0.25 ug via ORAL
  Filled 2015-09-01 (×2): qty 1

## 2015-09-01 MED ORDER — HYDROCORTISONE NA SUCCINATE PF 100 MG IJ SOLR
50.0000 mg | Freq: Two times a day (BID) | INTRAMUSCULAR | Status: DC
  Administered 2015-09-02 (×2): 50 mg via INTRAVENOUS
  Filled 2015-09-01 (×2): qty 2

## 2015-09-01 MED ORDER — SODIUM CHLORIDE 0.9 % IV SOLN
INTRAVENOUS | Status: DC
  Administered 2015-09-01: 17:00:00 via INTRAVENOUS

## 2015-09-01 MED ORDER — DARBEPOETIN ALFA 60 MCG/0.3ML IJ SOSY
60.0000 ug | PREFILLED_SYRINGE | INTRAMUSCULAR | Status: DC
  Filled 2015-09-01: qty 0.3

## 2015-09-01 NOTE — Progress Notes (Signed)
[facility] TEAM 1 - Stepdown/ICU TEAM  KAYLIN SCHELLENBERG  XLK:[id number] DOB: October 13, 1926 DOA: 08/28/2015 PCP: Angelina Sheriff., MD    Brief Narrative:  82yo male with hx of AoS, Afib, ESRD, HTN, and Guillain-Barre who presented 7/31 with c/o weakness, malaise and a fall at home.  In the ER he had progressive hypotension despite volume and chronic R sided effusion (previously thought ?malignant) with possible infiltrate on CXR.  PCCM admitted the pt to the ICU.     Subjective: The patient is alert and conversant and in good spirits.  He denies chest pain shortness breath fevers chills nausea or vomiting.  He states overall he is feeling much better.  Assessment & Plan:  RLL PNA  Continue empiric antibiotic for a planned 5 day course  E.coli bacteremia  Source not clear - plan to complete a 14 day course of antibiotic therapy   Chronic R effusion had thora 02/14/2015 X 1 liter/ glucose 106 / Wbc 160 with L > P, cyt POS atypical cells - has been evaluated by PCCM   Hypotension - acute on chronic  Sepsis w/ bacteremia on baseline of chronic hypotension - now off pressors - midodrine continues   Chronic AFib not on anticoagulation   ESRD on MWF HD Nephrology following   Hypokalemia  Corrected w/ HD   Elevated alk phos / T bil Improving - recheck in AM  Thrombocytopenia  Likely due to gram-negative bacteremia - improving - follow  Hypothyroid   Continue home synthroid.  Hx seizures  Continue home lamictal   History [name]  DVT prophylaxis: SCDs Code Status: NO INTUBATION - NO CPR - NO DEFIB Family Communication: Spoke with wife at bedside  Disposition Plan: SDU  Consultants:  Nephrology PCCM  Procedures: none  Antimicrobials:  Azithromycin 7/31 > Cefepime 7/31 Rocephin 8/1 >  Objective: Blood pressure (!) 157/69, pulse 73, temperature 97.5 F (36.4 C), temperature source Oral, resp. rate (!) 23, height 5' 9"  (1.753 m), weight 72.2 kg (159 lb 2.8  oz), SpO2 94 %.  Intake/Output Summary (Last 24 hours) at 09/01/15 1414 Last data filed at 09/01/15 1406  Gross per 24 hour  Intake              170 ml  Output             3000 ml  Net            -2830 ml   Filed Weights   09/01/15 0500 09/01/15 0645 09/01/15 1059  Weight: 76.1 kg (167 lb 12.3 oz) 75.8 kg (167 lb 1.7 oz) 72.2 kg (159 lb 2.8 oz)    Examination: General: No acute respiratory distress Lungs: Poor air movement bilateral bases with no wheeze Cardiovascular: Regular rate and rhythm without murmur gallop or rub normal S1 and S2 Abdomen: Nontender, nondistended, soft, bowel sounds positive, no rebound, no ascites, no appreciable mass Extremities: No significant cyanosis, clubbing, or edema bilateral lower extremities  CBC:  Recent Labs Lab 08/28/15 0807 08/28/15 0835 08/29/15 0425 08/30/15 0422 08/31/15 0720 09/01/15 0500  WBC 5.8  --  8.0 5.6 7.5 5.0  NEUTROABS 5.0  --   --   --   --   --   HGB 9.5* 10.9* 10.1* 9.9* 10.3* 10.4*  HCT 31.0* 32.0* 31.8* 31.1* 32.8* 32.8*  MCV 107.6*  --  105.0* 103.0* 105.5* 103.1*  PLT 50*  --  52* 51* 70* 69*   Basic Metabolic Panel:  Recent Labs Lab 08/28/15  9470 08/28/15 0835 08/29/15 0425 08/30/15 0422 08/31/15 0720 09/01/15 0500  NA 136 138 132* 129* 131* 132*  K 3.3* 3.3* 3.5 3.9 3.7 4.4  CL 98* 94* 95* 92* 93* 92*  CO2 28  --  26 24 28 27   GLUCOSE 88 81 104* 163* 116* 94  BUN 28* 28* 34* 41* 23* 29*  CREATININE 5.20* 5.00* 5.52* 6.00* 3.88* 4.84*  CALCIUM 8.7*  --  8.3* 8.2* 8.3* 8.5*  MG  --   --   --  2.1 1.9 2.5*  PHOS  --   --   --  4.9* 3.9 4.9*   GFR: Estimated Creatinine Clearance: 11 mL/min (by C-G formula based on SCr of 4.84 mg/dL).  Liver Function Tests:  Recent Labs Lab 08/28/15 0807 08/29/15 0930  AST 133* 97*  ALT 38 26  ALKPHOS 332* 249*  BILITOT 3.7* 2.6*  PROT 5.7* 5.4*  ALBUMIN 2.8* 2.5*    Coagulation Profile:  Recent Labs Lab 08/28/15 0807  INR 1.29    Cardiac  Enzymes:  Recent Labs Lab 08/28/15 1255 08/28/15 1836 08/29/15 0026  TROPONINI 0.09* 0.16* 0.20*   CBG:  Recent Labs Lab 08/28/15 0902  GLUCAP 87    Recent Results (from the past 240 hour(s))  Blood culture (routine x 2)     Status: None (Preliminary result)   Collection Time: 08/28/15  8:27 AM  Result Value Ref Range Status   Specimen Description BLOOD RIGHT ANTECUBITAL  Final   Special Requests BOTTLES DRAWN AEROBIC AND ANAEROBIC  5CC  Final   Culture NO GROWTH 4 DAYS  Final   Report Status PENDING  Incomplete  Blood culture (routine x 2)     Status: Abnormal   Collection Time: 08/28/15  8:30 AM  Result Value Ref Range Status   Specimen Description BLOOD RIGHT HAND  Final   Special Requests BOTTLES DRAWN AEROBIC AND ANAEROBIC  5CC  Final   Culture  Setup Time   Final    GRAM NEGATIVE RODS IN BOTH AEROBIC AND ANAEROBIC BOTTLES CRITICAL RESULT CALLED TO, READ BACK BY AND VERIFIED WITHAlona Bene PHARMD 0305 08/29/15 A BROWNING    Culture ESCHERICHIA COLI (A)  Final   Report Status 09/01/2015 FINAL  Final   Organism ID, Bacteria ESCHERICHIA COLI  Final      Susceptibility   Escherichia coli - MIC*    AMPICILLIN >=32 RESISTANT Resistant     CEFAZOLIN <=4 SENSITIVE Sensitive     CEFEPIME <=1 SENSITIVE Sensitive     CEFTAZIDIME <=1 SENSITIVE Sensitive     CEFTRIAXONE <=1 SENSITIVE Sensitive     CIPROFLOXACIN <=0.25 SENSITIVE Sensitive     GENTAMICIN <=1 SENSITIVE Sensitive     IMIPENEM <=0.25 SENSITIVE Sensitive     TRIMETH/SULFA <=20 SENSITIVE Sensitive     AMPICILLIN/SULBACTAM 16 INTERMEDIATE Intermediate     PIP/TAZO <=4 SENSITIVE Sensitive     Extended ESBL NEGATIVE Sensitive     * ESCHERICHIA COLI  Blood Culture ID Panel (Reflexed)     Status: Abnormal   Collection Time: 08/28/15  8:30 AM  Result Value Ref Range Status   Enterococcus species NOT DETECTED NOT DETECTED Final   Vancomycin resistance NOT DETECTED NOT DETECTED Final   Listeria monocytogenes NOT  DETECTED NOT DETECTED Final   Staphylococcus species NOT DETECTED NOT DETECTED Final   Staphylococcus aureus NOT DETECTED NOT DETECTED Final   Methicillin resistance NOT DETECTED NOT DETECTED Final   Streptococcus species NOT DETECTED NOT DETECTED Final  Streptococcus agalactiae NOT DETECTED NOT DETECTED Final   Streptococcus pneumoniae NOT DETECTED NOT DETECTED Final   Streptococcus pyogenes NOT DETECTED NOT DETECTED Final   Acinetobacter baumannii NOT DETECTED NOT DETECTED Final   Enterobacteriaceae species DETECTED (A) NOT DETECTED Final    Comment: CRITICAL RESULT CALLED TO, READ BACK BY AND VERIFIED WITH: V BRYK PHARMD 0305 08/29/15 A BROWNING    Enterobacter cloacae complex NOT DETECTED NOT DETECTED Final   Escherichia coli DETECTED (A) NOT DETECTED Final    Comment: CRITICAL RESULT CALLED TO, READ BACK BY AND VERIFIED WITHAlona Bene PHARMD 0305 08/29/15 A BROWNING    Klebsiella oxytoca NOT DETECTED NOT DETECTED Final   Klebsiella pneumoniae NOT DETECTED NOT DETECTED Final   Proteus species NOT DETECTED NOT DETECTED Final   Serratia marcescens NOT DETECTED NOT DETECTED Final   Carbapenem resistance NOT DETECTED NOT DETECTED Final   Haemophilus influenzae NOT DETECTED NOT DETECTED Final   Neisseria meningitidis NOT DETECTED NOT DETECTED Final   Pseudomonas aeruginosa NOT DETECTED NOT DETECTED Final   Candida albicans NOT DETECTED NOT DETECTED Final   Candida glabrata NOT DETECTED NOT DETECTED Final   Candida krusei NOT DETECTED NOT DETECTED Final   Candida parapsilosis NOT DETECTED NOT DETECTED Final   Candida tropicalis NOT DETECTED NOT DETECTED Final  MRSA PCR Screening     Status: None   Collection Time: 08/28/15  3:02 PM  Result Value Ref Range Status   MRSA by PCR NEGATIVE NEGATIVE Final    Comment:        The GeneXpert MRSA Assay (FDA approved for NASAL specimens only), is one component of a comprehensive MRSA colonization surveillance program. It is not intended to  diagnose MRSA infection nor to guide or monitor treatment for MRSA infections.      Scheduled Meds: . albumin human  25 g Intravenous Once  . antiseptic oral rinse  7 mL Mouth Rinse q12n4p  . calcitRIOL  0.25 mcg Oral Q M,W,F-1800  . calcium carbonate  1 tablet Oral TID WC  . cefTRIAXone (ROCEPHIN)  IV  2 g Intravenous Q24H  . chlorhexidine  15 mL Mouth Rinse BID  . darbepoetin (ARANESP) injection - DIALYSIS  60 mcg Intravenous Q Fri-HD  . hydrocortisone sodium succinate  50 mg Intravenous Q6H  . lamoTRIgine  100 mg Oral QHS  . lamoTRIgine  50 mg Oral Daily  . levothyroxine  75 mcg Oral QAC breakfast  . midodrine  10 mg Oral Q6H  . sertraline  25 mg Oral Daily     LOS: 4 days   Cherene Altes, MD [facility] Office  [phone] Pager - Text Page per Amion as per below:  On-Call/Text Page:      [email or web address]      password [email or web address]  If 7PM-7AM, please contact night-coverage [email or web address] Password [email or web address] 09/01/2015, 2:14 PM

## 2015-09-01 NOTE — Procedures (Signed)
Patient was seen on dialysis and the procedure was supervised.  BFR 400  Via AVF BP is  172/70.   Patient appears to be tolerating treatment well- attempting 3 liters of UF   Darneisha Windhorst A 09/01/2015

## 2015-09-01 NOTE — Progress Notes (Signed)
Subjective:  BP much improved- now high- moved to 2C- still very weak - seen on HD this AM  Objective Vital signs in last 24 hours: Vitals:   09/01/15 0730 09/01/15 0800 09/01/15 0830 09/01/15 0900  BP: (!) 154/89 (!) 165/64 (!) 161/63 (!) 172/70  Pulse: 79 82 81 82  Resp: 10 13 11 13   Temp:      TempSrc:      SpO2: 98% 98% 97% 96%  Weight:      Height:       Weight change: 5.4 kg (11 lb 14.5 oz)  Intake/Output Summary (Last 24 hours) at 09/01/15 0908 Last data filed at 08/31/15 1727  Gross per 24 hour  Intake              600 ml  Output                0 ml  Net              600 ml   Dialyzes at [facility] MWF- 3 hours and 15 min EDW 64- getting close as OP. HD Bath 2K/2.25 ca, Dialyzer 160, Heparin no. Access AVF. 450 BFR- venofer 50 weekly, mircera 75 q 2 weeks- given 7/19- last hgb 10/4  On TUMS for binding and calcitriol 0.25- last ca=9.7, phos 3.6, PTH 77   Assessment/Plan: 82 year old WM with multiple medical problems including ESRD presenting with weakness and found to have hypotension beyond normal hypotension for him 1 Hypotension- thought to possibly be related to sepsis- now with  positive blood cultures- GNR.  Previously on pressors- now weaned off.  Seems to be making progress- he looks better to me clinically  2 ESRD: normally MWF at [facility] via AVF- missed Monday and given his BP did not think he would tolerate.  did Wednesday.   Normally BP does get down to the 80's on regular HD.   HD today on schedule- will then try for next HD on Monday  3 Sepsis: placed on azithro and rocephin for possible pulmonary infection - now has positive blood cultures for GNR-per CCM- is a do not intubate- clinically improving 4. Anemia of ESRD: on mircera and venofer as OP- will be due ESA this week- hgb 10.4 5. Metabolic Bone Disease: normally on calcitriol and tums- will resume 6. Hyponatremia- due to volume overload- removal with HD  7. Dispo- PT /OT- I am afraid these events will take him  down a notch   Elisheva Fallas A    Labs: Basic Metabolic Panel:  Recent Labs Lab 08/30/15 0422 08/31/15 0720 09/01/15 0500  NA 129* 131* 132*  K 3.9 3.7 4.4  CL 92* 93* 92*  CO2 24 28 27   GLUCOSE 163* 116* 94  BUN 41* 23* 29*  CREATININE 6.00* 3.88* 4.84*  CALCIUM 8.2* 8.3* 8.5*  PHOS 4.9* 3.9 4.9*   Liver Function Tests:  Recent Labs Lab 08/28/15 0807 08/29/15 0930  AST 133* 97*  ALT 38 26  ALKPHOS 332* 249*  BILITOT 3.7* 2.6*  PROT 5.7* 5.4*  ALBUMIN 2.8* 2.5*   No results for input(s): LIPASE, AMYLASE in the last 168 hours. No results for input(s): AMMONIA in the last 168 hours. CBC:  Recent Labs Lab 08/28/15 0807  08/29/15 0425 08/30/15 0422 08/31/15 0720 09/01/15 0500  WBC 5.8  --  8.0 5.6 7.5 5.0  NEUTROABS 5.0  --   --   --   --   --   HGB 9.5*  < >  10.1* 9.9* 10.3* 10.4*  HCT 31.0*  < > 31.8* 31.1* 32.8* 32.8*  MCV 107.6*  --  105.0* 103.0* 105.5* 103.1*  PLT 50*  --  52* 51* 70* 69*  < > = values in this interval not displayed. Cardiac Enzymes:  Recent Labs Lab 08/28/15 1255 08/28/15 1836 08/29/15 0026  TROPONINI 0.09* 0.16* 0.20*   CBG:  Recent Labs Lab 08/28/15 0902  GLUCAP 87    Iron Studies: No results for input(s): IRON, TIBC, TRANSFERRIN, FERRITIN in the last 72 hours. Studies/Results: No results found. Medications: Infusions: . phenylephrine (NEO-SYNEPHRINE) Adult infusion Stopped (08/31/15 [id number])    Scheduled Medications: . albumin human  25 g Intravenous Once  . antiseptic oral rinse  7 mL Mouth Rinse q12n4p  . azithromycin  500 mg Intravenous Q24H  . cefTRIAXone (ROCEPHIN)  IV  2 g Intravenous Q24H  . chlorhexidine  15 mL Mouth Rinse BID  . hydrocortisone sodium succinate  50 mg Intravenous Q6H  . lamoTRIgine  100 mg Oral QHS  . lamoTRIgine  50 mg Oral Daily  . levothyroxine  75 mcg Oral QAC breakfast  . midodrine  10 mg Oral Q6H  . sertraline  25 mg Oral Daily    have reviewed scheduled and prn  medications.  Physical Exam: General:somnolent, arouseable Heart: RRR Lungs: poor effort Abdomen: soft, non tender Extremities: pitting peripheral edema Dialysis Access: left AVF-- aneurysmal but does not look infected    09/01/2015,9:08 AM  LOS: 4 days

## 2015-09-01 NOTE — Progress Notes (Signed)
Speech Language Pathology Treatment: Dysphagia  Patient Details Name: [name] MRN: [id number] DOB: 10-Jul-1926 Today's Date: 09/01/2015 Time: [id number] SLP Time Calculation (min) (ACUTE ONLY): 18 min  Assessment / Plan / Recommendation Clinical Impression  Skilled observation of consumption of solids/thin (without straw) without overt s/s of aspiration, but suspect delayed swallow response overall and pt required liquid wash after solid consumption and modified independent with verbal cues for small bites/sips; successive swallows of thin via cup appear WFL and pt stated he "usually doesn't use a straw" during meals/snacks.  Decreased mastication (may be d/t poor/missing condition of natural dentition)ST f/u x1 d/t decreased respiratory status, PNA and generalized weakness placing him at a higher aspiration risk at this time and s/o if pt continues to make progress with current POC.    HPI HPI: Pt is an 82 y.o. male with PMH of AS, Afib, ESRD, HTN, hx Guillain-Barre presented 7/31 with c/o weakness, malaise and fall at home. CXR showed stable L moderate-size pleural effusion and underlying opacity, new R pleural effusion and underlying opacity, mor diffuse opacity seen throughout R mid and lower lungs suggestive of infiltrate. Bedside swallow eval ordered due to concerns for aspiration- RN swallow screen performed with history of dysphagia listed; however, no previous SLP visit noted in chart review.      SLP Plan  Continue with current plan of care     Recommendations  Diet recommendations: Dysphagia 3 (mechanical soft);Thin liquid Liquids provided via: Cup;No straw Medication Administration: Whole meds with liquid Supervision: Staff to assist with self feeding Compensations: Slow rate;Small sips/bites;Follow solids with liquid Postural Changes and/or Swallow Maneuvers: Seated upright 90 degrees             Oral Care Recommendations: Oral care BID Follow up Recommendations: Other  (comment) (tbd) Plan: Continue with current plan of care                     ADAMS,PAT, M.S., CCC-SLP 09/01/2015, 12:44 PM

## 2015-09-01 NOTE — Progress Notes (Signed)
Pt arrived to floor from dialysis in nad.

## 2015-09-01 NOTE — Evaluation (Signed)
Physical Therapy Evaluation Patient Details Name: [name] MRN: [id number] DOB: 09/07/1926 Today's Date: 09/01/2015   History of Present Illness  82yo male with hx of Afib, ESRD on HD MWF, HTN, andGuillain-Barre who presented 7/31 with c/o weakness, malaise and a fall at home. progressive hypotension, chronic R sided effusion (previously thought ?malignant) with possibleinfiltrate on CXR. +RLL pna, +ecoli bacteremia. In ICU on pressors 7/31-08/03/17    Clinical Impression  Pt admitted with above diagnoses. He has had a significant decline in function over 3-4 weeks (walking with cane modified independent to barely walking with RW with a fall PTA. Pt currently with functional limitations due to the deficits listed below (see PT Problem List).  Pt will benefit from skilled PT to increase their independence and safety with mobility to allow discharge to the venue listed below.       Follow Up Recommendations CIR    Equipment Recommendations  Other (comment) (TBA)    Recommendations for Other Services OT consult;Rehab consult     Precautions / Restrictions Precautions Precautions: Fall      Mobility  Bed Mobility Overal bed mobility: Needs Assistance Bed Mobility: Rolling;Sidelying to Sit;Sit to Sidelying Rolling: Mod assist Sidelying to sit: Max assist;+2 for physical assistance;HOB elevated     Sit to sidelying: Mod assist;+2 for physical assistance General bed mobility comments: coming to EOB limited due to Rt knee pain and weakness; once seated, able to scoot to edge without assist  Transfers Overall transfer level: Needs assistance Equipment used: Rolling walker (2 wheeled) Transfers: Sit to/from Stand Sit to Stand: Max assist;+2 physical assistance;From elevated surface         General transfer comment: only cleared hip ~4" off bed on 2 attempts; pt with good effort  Ambulation/Gait                Stairs            Wheelchair Mobility     Modified Rankin (Stroke Patients Only)       Balance Overall balance assessment: Needs assistance;History of Falls Sitting-balance support: Bilateral upper extremity supported;Feet supported Sitting balance-Leahy Scale: Poor Sitting balance - Comments: initial posterior lean required support; progressed to closeguard Postural control: Posterior lean                                   Pertinent Vitals/Pain Pain Assessment: Faces Faces Pain Scale: Hurts whole lot Pain Location: Rt knee Pain Descriptors / Indicators: Grimacing;Guarding;Sharp Pain Intervention(s): Limited activity within patient's tolerance;Monitored during session;Repositioned;Other (comment) (AROM)    Home Living Family/patient expects to be discharged to:: Unsure Living Arrangements: Spouse/significant other Available Help at Discharge: Family Type of Home: House Home Access: Ramped entrance     [place]: One level Home Equipment: Environmental consultant - 4 wheels;Cane - single point      Prior Function Level of Independence: Independent with assistive device(s)         Comments: Prior to 3-4 weeks ago; walking with cane most of the time; progressive weakness with progression to rollator and then unable to stand with fall to floor precipitating admission     Hand Dominance        Extremity/Trunk Assessment   Upper Extremity Assessment: Generalized weakness           Lower Extremity Assessment: RLE deficits/detail;LLE deficits/detail RLE Deficits / Details: AAROM limited by Rt knee pain (however in sitting able to bend >90);  knee extension 3+ LLE Deficits / Details: AROM WFL, knee extension 3+  Cervical / Trunk Assessment: Kyphotic  Communication   Communication: HOH  Cognition Arousal/Alertness: Awake/alert Behavior During Therapy: WFL for tasks assessed/performed   Area of Impairment: Memory;Problem solving     Memory: Decreased short-term memory       Problem Solving: Slow  processing;Difficulty sequencing;Requires verbal cues;Requires tactile cues General Comments: answers "I don't know to most questions" (even when pressed further);     General Comments General comments (skin integrity, edema, etc.): Wife present. Initially she was answering questions for pt then suddenly stopped and remained quiet.    Exercises General Exercises - Lower Extremity Ankle Circles/Pumps: AROM;Both;5 reps Quad Sets: AROM;Both;5 reps Long Arc Quad: AROM;Both;10 reps Heel Slides: AAROM;Strengthening;Both;5 reps      Assessment/Plan    PT Assessment Patient needs continued PT services  PT Diagnosis Generalized weakness;Difficulty walking   PT Problem List Decreased strength;Decreased range of motion;Decreased activity tolerance;Decreased balance;Decreased mobility;Decreased cognition;Decreased knowledge of use of DME;Pain  PT Treatment Interventions DME instruction;Gait training;Functional mobility training;Therapeutic activities;Therapeutic exercise;Balance training;Neuromuscular re-education;Cognitive remediation;Patient/family education   PT Goals (Current goals can be found in the Care Plan section) Acute Rehab PT Goals Patient Stated Goal: Be able to walk PT Goal Formulation: With patient Time For Goal Achievement: 09/08/15 Potential to Achieve Goals: Fair    Frequency Min 3X/week   Barriers to discharge Decreased caregiver support Wife can provide supervision to min assist    Co-evaluation               End of Session Equipment Utilized During Treatment: Gait belt;Oxygen Activity Tolerance: Patient limited by fatigue Patient left: in bed;with call bell/phone within reach;with family/visitor present Nurse Communication: Mobility status;Need for lift equipment         Time: [phone] PT Time Calculation (min) (ACUTE ONLY): 27 min   Charges:   PT Evaluation $PT Eval Moderate Complexity: 1 Procedure PT Treatments $Therapeutic Activity: 8-22 mins    PT G Codes:        Amron Guerrette Sep 23, 2015, 5:16 PM  Pager [phone]

## 2015-09-02 ENCOUNTER — Inpatient Hospital Stay (HOSPITAL_COMMUNITY): Payer: Medicare HMO

## 2015-09-02 LAB — COMPREHENSIVE METABOLIC PANEL
ALBUMIN: 2.7 g/dL — AB (ref 3.5–5.0)
ALT: 99 U/L — ABNORMAL HIGH (ref 17–63)
AST: 252 U/L — AB (ref 15–41)
Alkaline Phosphatase: 487 U/L — ABNORMAL HIGH (ref 38–126)
Anion gap: 12 (ref 5–15)
BUN: 18 mg/dL (ref 6–20)
CHLORIDE: 93 mmol/L — AB (ref 101–111)
CO2: 28 mmol/L (ref 22–32)
Calcium: 8.7 mg/dL — ABNORMAL LOW (ref 8.9–10.3)
Creatinine, Ser: 3.32 mg/dL — ABNORMAL HIGH (ref 0.61–1.24)
GFR calc Af Amer: 18 mL/min — ABNORMAL LOW (ref >60)
GFR, EST NON AFRICAN AMERICAN: 15 mL/min — AB (ref >60)
Glucose, Bld: 121 mg/dL — ABNORMAL HIGH (ref 65–99)
POTASSIUM: 4.2 mmol/L (ref 3.5–5.1)
Sodium: 133 mmol/L — ABNORMAL LOW (ref 135–145)
Total Bilirubin: 2.3 mg/dL — ABNORMAL HIGH (ref 0.3–1.2)
Total Protein: 5.8 g/dL — ABNORMAL LOW (ref 6.5–8.1)

## 2015-09-02 LAB — CBC
HEMATOCRIT: 34.6 % — AB (ref 39.0–52.0)
HEMOGLOBIN: 11 g/dL — AB (ref 13.0–17.0)
MCH: 32.9 pg (ref 26.0–34.0)
MCHC: 31.8 g/dL (ref 30.0–36.0)
MCV: 103.6 fL — AB (ref 78.0–100.0)
Platelets: 86 10*3/uL — ABNORMAL LOW (ref 150–400)
RBC: 3.34 MIL/uL — ABNORMAL LOW (ref 4.22–5.81)
RDW: 16.6 % — AB (ref 11.5–15.5)
WBC: 5.1 10*3/uL (ref 4.0–10.5)

## 2015-09-02 LAB — CULTURE, BLOOD (ROUTINE X 2): Culture: NO GROWTH

## 2015-09-02 MED ORDER — QUETIAPINE FUMARATE 25 MG PO TABS
12.5000 mg | ORAL_TABLET | Freq: Every day | ORAL | Status: DC
  Administered 2015-09-02 – 2015-09-11 (×10): 12.5 mg via ORAL
  Filled 2015-09-02 (×10): qty 1

## 2015-09-02 NOTE — Plan of Care (Signed)
Problem: Nutrition: Goal: Adequate nutrition will be maintained Outcome: Progressing Discussed importance of keeping up his nutrition and energy.  Patient tried a vanilla ensure and enjoyed since he had not eaten dinner.  Patient still needs encouragement and re-education.

## 2015-09-02 NOTE — Progress Notes (Signed)
[facility] TEAM 1 - Stepdown/ICU TEAM  [name]  [id number] DOB: [birth year] DOA: 08/28/2015 PCP: Angelina Sheriff., MD    Brief Narrative:  82yo male with hx of AoS, Afib, ESRD, HTN, and Guillain-Barre who presented 7/31 with c/o weakness, malaise and a fall at home.  In the ER he had progressive hypotension despite volume and chronic R sided effusion (previously thought ?malignant) with possible infiltrate on CXR.  PCCM admitted the pt to the ICU.    Subjective: The patient is alert but confused today.  His wife is not at the bedside at the time of visit.  He is not able to provide a reliable history because of his confusion.  He does not appear uncomfortable and his respirations appear unlabored.  Assessment & Plan:  RLL PNA  Completed a 5 day course of empiric abx   E.coli bacteremia  Source not clear - plan to complete a 14 day course of antibiotic therapy for this indication - transition to oral meds after 7 days of tx   Chronic R effusion - subacute L effusion  had thora 02/14/2015 X 1 liter/ glucose 106 / Wbc 160 with L > P, cyt POS atypical cells - has been evaluated by PCCM - signif L sided effusion noted on f/u CXR this AM, progressing since prior CXRs - clinically stable at present - follow    Hypotension - acute on chronic  Sepsis w/ bacteremia on baseline of chronic hypotension - now off pressors - midodrine continues - blood pressure stable/elevated presently  Chronic AFib not on anticoagulation - heart rate well controlled  ESRD on MWF HD Nephrology following   Macrocytic anemia Check V49 and folic acid levels   Hypokalemia  Corrected w/ HD   Elevated alk phos / T bil Fluctuating - no clear etiology - potentially related to chronic right pleural effusion  Thrombocytopenia  Likely due to gram-negative bacteremia - improving w/ tx of same   Hypothyroid   Continue home synthroid  Hx seizures  Continue home lamictal   History [name]  DVT  prophylaxis: SCDs Code Status: NO INTUBATION - NO CPR - NO DEFIB Family Communication: No family present at time of visit today Disposition Plan: SDU  Consultants:  Nephrology PCCM  Procedures: none  Antimicrobials:  Azithromycin 7/31 > 8/04 Cefepime 7/31 Rocephin 8/1 >  Objective: Blood pressure (!) 166/82, pulse 70, temperature 97.5 F (36.4 C), temperature source Oral, resp. rate 13, height 5' 9"  (1.753 m), weight 71.8 kg (158 lb 4.6 oz), SpO2 99 %.  Intake/Output Summary (Last 24 hours) at 09/02/15 1626 Last data filed at 09/02/15 0600  Gross per 24 hour  Intake           476.17 ml  Output                0 ml  Net           476.17 ml   Filed Weights   09/01/15 0645 09/01/15 1059 09/02/15 0418  Weight: 75.8 kg (167 lb 1.7 oz) 72.2 kg (159 lb 2.8 oz) 71.8 kg (158 lb 4.6 oz)    Examination: General: No acute respiratory distress Lungs: Poor air movement bilateral bases - No wheezing Cardiovascular: Regular rate without murmur gallop or rub  Abdomen: Nontender, nondistended, soft, bowel sounds positive, no rebound, no ascites, no appreciable mass Extremities: No significant cyanosis, clubbing, edema bilateral lower extremities  CBC:  Recent Labs Lab 08/28/15 0807  08/29/15 0425 08/30/15 0422 08/31/15  0720 09/01/15 0500 09/02/15 0527  WBC 5.8  --  8.0 5.6 7.5 5.0 5.1  NEUTROABS 5.0  --   --   --   --   --   --   HGB 9.5*  < > 10.1* 9.9* 10.3* 10.4* 11.0*  HCT 31.0*  < > 31.8* 31.1* 32.8* 32.8* 34.6*  MCV 107.6*  --  105.0* 103.0* 105.5* 103.1* 103.6*  PLT 50*  --  52* 51* 70* 69* 86*  < > = values in this interval not displayed. Basic Metabolic Panel:  Recent Labs Lab 08/29/15 0425 08/30/15 0422 08/31/15 0720 09/01/15 0500 09/02/15 0527  NA 132* 129* 131* 132* 133*  K 3.5 3.9 3.7 4.4 4.2  CL 95* 92* 93* 92* 93*  CO2 26 24 28 27 28   GLUCOSE 104* 163* 116* 94 121*  BUN 34* 41* 23* 29* 18  CREATININE 5.52* 6.00* 3.88* 4.84* 3.32*  CALCIUM 8.3* 8.2*  8.3* 8.5* 8.7*  MG  --  2.1 1.9 2.5*  --   PHOS  --  4.9* 3.9 4.9*  --    GFR: Estimated Creatinine Clearance: 16 mL/min (by C-G formula based on SCr of 3.32 mg/dL).  Liver Function Tests:  Recent Labs Lab 08/28/15 0807 08/29/15 0930 09/02/15 0527  AST 133* 97* 252*  ALT 38 26 99*  ALKPHOS 332* 249* 487*  BILITOT 3.7* 2.6* 2.3*  PROT 5.7* 5.4* 5.8*  ALBUMIN 2.8* 2.5* 2.7*    Coagulation Profile:  Recent Labs Lab 08/28/15 0807  INR 1.29    Cardiac Enzymes:  Recent Labs Lab 08/28/15 1255 08/28/15 1836 08/29/15 0026  TROPONINI 0.09* 0.16* 0.20*   CBG:  Recent Labs Lab 08/28/15 0902  GLUCAP 87    Recent Results (from the past 240 hour(s))  Blood culture (routine x 2)     Status: None (Preliminary result)   Collection Time: 08/28/15  8:27 AM  Result Value Ref Range Status   Specimen Description BLOOD RIGHT ANTECUBITAL  Final   Special Requests BOTTLES DRAWN AEROBIC AND ANAEROBIC  5CC  Final   Culture NO GROWTH 4 DAYS  Final   Report Status PENDING  Incomplete  Blood culture (routine x 2)     Status: Abnormal   Collection Time: 08/28/15  8:30 AM  Result Value Ref Range Status   Specimen Description BLOOD RIGHT HAND  Final   Special Requests BOTTLES DRAWN AEROBIC AND ANAEROBIC  5CC  Final   Culture  Setup Time   Final    GRAM NEGATIVE RODS IN BOTH AEROBIC AND ANAEROBIC BOTTLES CRITICAL RESULT CALLED TO, READ BACK BY AND VERIFIED WITHAlona Bene PHARMD 0305 08/29/15 A BROWNING    Culture ESCHERICHIA COLI (A)  Final   Report Status 09/01/2015 FINAL  Final   Organism ID, Bacteria ESCHERICHIA COLI  Final      Susceptibility   Escherichia coli - MIC*    AMPICILLIN >=32 RESISTANT Resistant     CEFAZOLIN <=4 SENSITIVE Sensitive     CEFEPIME <=1 SENSITIVE Sensitive     CEFTAZIDIME <=1 SENSITIVE Sensitive     CEFTRIAXONE <=1 SENSITIVE Sensitive     CIPROFLOXACIN <=0.25 SENSITIVE Sensitive     GENTAMICIN <=1 SENSITIVE Sensitive     IMIPENEM <=0.25 SENSITIVE  Sensitive     TRIMETH/SULFA <=20 SENSITIVE Sensitive     AMPICILLIN/SULBACTAM 16 INTERMEDIATE Intermediate     PIP/TAZO <=4 SENSITIVE Sensitive     Extended ESBL NEGATIVE Sensitive     * ESCHERICHIA COLI  Blood Culture  ID Panel (Reflexed)     Status: Abnormal   Collection Time: 08/28/15  8:30 AM  Result Value Ref Range Status   Enterococcus species NOT DETECTED NOT DETECTED Final   Vancomycin resistance NOT DETECTED NOT DETECTED Final   Listeria monocytogenes NOT DETECTED NOT DETECTED Final   Staphylococcus species NOT DETECTED NOT DETECTED Final   Staphylococcus aureus NOT DETECTED NOT DETECTED Final   Methicillin resistance NOT DETECTED NOT DETECTED Final   Streptococcus species NOT DETECTED NOT DETECTED Final   Streptococcus agalactiae NOT DETECTED NOT DETECTED Final   Streptococcus pneumoniae NOT DETECTED NOT DETECTED Final   Streptococcus pyogenes NOT DETECTED NOT DETECTED Final   Acinetobacter baumannii NOT DETECTED NOT DETECTED Final   Enterobacteriaceae species DETECTED (A) NOT DETECTED Final    Comment: CRITICAL RESULT CALLED TO, READ BACK BY AND VERIFIED WITH: V BRYK PHARMD 0305 08/29/15 A BROWNING    Enterobacter cloacae complex NOT DETECTED NOT DETECTED Final   Escherichia coli DETECTED (A) NOT DETECTED Final    Comment: CRITICAL RESULT CALLED TO, READ BACK BY AND VERIFIED WITHAlona Bene PHARMD 0305 08/29/15 A BROWNING    Klebsiella oxytoca NOT DETECTED NOT DETECTED Final   Klebsiella pneumoniae NOT DETECTED NOT DETECTED Final   Proteus species NOT DETECTED NOT DETECTED Final   Serratia marcescens NOT DETECTED NOT DETECTED Final   Carbapenem resistance NOT DETECTED NOT DETECTED Final   Haemophilus influenzae NOT DETECTED NOT DETECTED Final   Neisseria meningitidis NOT DETECTED NOT DETECTED Final   Pseudomonas aeruginosa NOT DETECTED NOT DETECTED Final   Candida albicans NOT DETECTED NOT DETECTED Final   Candida glabrata NOT DETECTED NOT DETECTED Final   Candida krusei NOT  DETECTED NOT DETECTED Final   Candida parapsilosis NOT DETECTED NOT DETECTED Final   Candida tropicalis NOT DETECTED NOT DETECTED Final  MRSA PCR Screening     Status: None   Collection Time: 08/28/15  3:02 PM  Result Value Ref Range Status   MRSA by PCR NEGATIVE NEGATIVE Final    Comment:        The GeneXpert MRSA Assay (FDA approved for NASAL specimens only), is one component of a comprehensive MRSA colonization surveillance program. It is not intended to diagnose MRSA infection nor to guide or monitor treatment for MRSA infections.      Scheduled Meds: . antiseptic oral rinse  7 mL Mouth Rinse q12n4p  . calcitRIOL  0.25 mcg Oral Q M,W,F-1800  . calcium carbonate  1 tablet Oral TID WC  . cefTRIAXone (ROCEPHIN)  IV  2 g Intravenous Q24H  . chlorhexidine  15 mL Mouth Rinse BID  . darbepoetin (ARANESP) injection - DIALYSIS  60 mcg Intravenous Q Fri-HD  . hydrocortisone sodium succinate  50 mg Intravenous Q12H  . lamoTRIgine  100 mg Oral QHS  . lamoTRIgine  50 mg Oral Daily  . levothyroxine  75 mcg Oral QAC breakfast  . midodrine  10 mg Oral Q6H  . sertraline  25 mg Oral Daily     LOS: 5 days   Cherene Altes, MD [facility] Office  [phone] Pager - Text Page per Shea Evans as per below:  On-Call/Text Page:      [email or web address]      password [email or web address]  If 7PM-7AM, please contact night-coverage [email or web address] Password [email or web address] 09/02/2015, 4:26 PM

## 2015-09-02 NOTE — Progress Notes (Signed)
Rehab Admissions Coordinator Note:  Patient was screened by Cleatrice Burke for appropriateness for an Inpatient Acute Rehab Consult per PT recommendation.  At this time, we are recommending Inpatient Rehab consult.  Cleatrice Burke 09/02/2015, 8:28 AM  I can be reached at [phone].

## 2015-09-02 NOTE — Plan of Care (Signed)
Problem: Education: Goal: Knowledge of [facility] Education information/materials will improve Outcome: Progressing Discussed importance of patients medications to help regulate his blood pressure. Patient demonstrated some understanding still needs follow-up.

## 2015-09-02 NOTE — Progress Notes (Signed)
Subjective:  HD yest removed 3 liters tolerated well- very weak.  Seems a little confused this AM Objective Vital signs in last 24 hours: Vitals:   09/02/15 0400 09/02/15 0418 09/02/15 0500 09/02/15 0600  BP: (!) 136/49   (!) 152/81  Pulse: 66  77 72  Resp: 11  14 (!) 9  Temp: 97.4 F (36.3 C)     TempSrc: Oral     SpO2: 98%  98% 99%  Weight:  71.8 kg (158 lb 4.6 oz)    Height:       Weight change: -3.7 kg (-8 lb 2.5 oz)  Intake/Output Summary (Last 24 hours) at 09/02/15 0755 Last data filed at 09/02/15 0600  Gross per 24 hour  Intake           596.17 ml  Output             3000 ml  Net         -2403.83 ml   Dialyzes at [facility] MWF- 3 hours and 15 min EDW 64- getting close as OP. HD Bath 2K/2.25 ca, Dialyzer 160, Heparin no. Access AVF. 450 BFR- venofer 50 weekly, mircera 75 q 2 weeks- given 7/19- last hgb 10/4  On TUMS for binding and calcitriol 0.25- last ca=9.7, phos 3.6, PTH 77   Assessment/Plan: 82 year old WM with multiple medical problems including ESRD presenting with weakness and found to have hypotension beyond normal hypotension for him 1 Hypotension- thought to possibly be related to sepsis- now with  positive blood cultures- E coli.  Previously on pressors- now weaned off.  Seems to be making progress- he looks better to me clinically  2 ESRD: normally MWF at [facility] via AVF- missed Monday and given his BP did not think he would tolerate.  did Wednesday and Friday.   Normally BP does get down to the 80's on regular HD.    next HD on Monday  3 Sepsis: placed on azithro and rocephin for possible pulmonary infection - now has positive blood cultures for GNR-per CCM- is a do not intubate- clinically improving 4. Anemia of ESRD: on mircera and venofer as OP- will be due ESA this week- hgb 11 5. Metabolic Bone Disease: normally on calcitriol and tums- have resumed 6. Hyponatremia- due to volume overload- removal with HD  7. Dispo- PT /OT- I am afraid these events will take him  down a notch   [name] A    Labs: Basic Metabolic Panel:  Recent Labs Lab 08/30/15 0422 08/31/15 0720 09/01/15 0500 09/02/15 0527  NA 129* 131* 132* 133*  K 3.9 3.7 4.4 4.2  CL 92* 93* 92* 93*  CO2 24 28 27 28   GLUCOSE 163* 116* 94 121*  BUN 41* 23* 29* 18  CREATININE 6.00* 3.88* 4.84* 3.32*  CALCIUM 8.2* 8.3* 8.5* 8.7*  PHOS 4.9* 3.9 4.9*  --    Liver Function Tests:  Recent Labs Lab 08/28/15 0807 08/29/15 0930 09/02/15 0527  AST 133* 97* 252*  ALT 38 26 99*  ALKPHOS 332* 249* 487*  BILITOT 3.7* 2.6* 2.3*  PROT 5.7* 5.4* 5.8*  ALBUMIN 2.8* 2.5* 2.7*   No results for input(s): LIPASE, AMYLASE in the last 168 hours. No results for input(s): AMMONIA in the last 168 hours. CBC:  Recent Labs Lab 08/28/15 0807  08/29/15 0425 08/30/15 0422 08/31/15 0720 09/01/15 0500 09/02/15 0527  WBC 5.8  --  8.0 5.6 7.5 5.0 5.1  NEUTROABS 5.0  --   --   --   --   --   --  HGB 9.5*  < > 10.1* 9.9* 10.3* 10.4* 11.0*  HCT 31.0*  < > 31.8* 31.1* 32.8* 32.8* 34.6*  MCV 107.6*  --  105.0* 103.0* 105.5* 103.1* 103.6*  PLT 50*  --  52* 51* 70* 69* 86*  < > = values in this interval not displayed. Cardiac Enzymes:  Recent Labs Lab 08/28/15 1255 08/28/15 1836 08/29/15 0026  TROPONINI 0.09* 0.16* 0.20*   CBG:  Recent Labs Lab 08/28/15 0902  GLUCAP 87    Iron Studies: No results for input(s): IRON, TIBC, TRANSFERRIN, FERRITIN in the last 72 hours. Studies/Results: No results found. Medications: Infusions: . sodium chloride 10 mL/hr at 09/01/15 1723    Scheduled Medications: . antiseptic oral rinse  7 mL Mouth Rinse q12n4p  . calcitRIOL  0.25 mcg Oral Q M,W,F-1800  . calcium carbonate  1 tablet Oral TID WC  . cefTRIAXone (ROCEPHIN)  IV  2 g Intravenous Q24H  . chlorhexidine  15 mL Mouth Rinse BID  . darbepoetin (ARANESP) injection - DIALYSIS  60 mcg Intravenous Q Fri-HD  . hydrocortisone sodium succinate  50 mg Intravenous Q12H  . lamoTRIgine   100 mg Oral QHS  . lamoTRIgine  50 mg Oral Daily  . levothyroxine  75 mcg Oral QAC breakfast  . midodrine  10 mg Oral Q6H  . sertraline  25 mg Oral Daily    have reviewed scheduled and prn medications.  Physical Exam: General:alert but slightly confused  Heart: RRR Lungs: poor effort Abdomen: soft, non tender Extremities: pitting peripheral edema- better  Dialysis Access: left AVF-- aneurysmal but does not look infected    09/02/2015,7:55 AM  LOS: 5 days

## 2015-09-03 LAB — CBC
HCT: 34.9 % — ABNORMAL LOW (ref 39.0–52.0)
HEMOGLOBIN: 11.2 g/dL — AB (ref 13.0–17.0)
MCH: 33 pg (ref 26.0–34.0)
MCHC: 32.1 g/dL (ref 30.0–36.0)
MCV: 102.9 fL — ABNORMAL HIGH (ref 78.0–100.0)
Platelets: 90 10*3/uL — ABNORMAL LOW (ref 150–400)
RBC: 3.39 MIL/uL — ABNORMAL LOW (ref 4.22–5.81)
RDW: 16.8 % — AB (ref 11.5–15.5)
WBC: 6.4 10*3/uL (ref 4.0–10.5)

## 2015-09-03 LAB — COMPREHENSIVE METABOLIC PANEL
ALBUMIN: 2.7 g/dL — AB (ref 3.5–5.0)
ALT: 98 U/L — ABNORMAL HIGH (ref 17–63)
ANION GAP: 14 (ref 5–15)
AST: 176 U/L — ABNORMAL HIGH (ref 15–41)
Alkaline Phosphatase: 403 U/L — ABNORMAL HIGH (ref 38–126)
BUN: 26 mg/dL — ABNORMAL HIGH (ref 6–20)
CHLORIDE: 92 mmol/L — AB (ref 101–111)
CO2: 27 mmol/L (ref 22–32)
Calcium: 9.1 mg/dL (ref 8.9–10.3)
Creatinine, Ser: 4.09 mg/dL — ABNORMAL HIGH (ref 0.61–1.24)
GFR calc Af Amer: 14 mL/min — ABNORMAL LOW (ref >60)
GFR calc non Af Amer: 12 mL/min — ABNORMAL LOW (ref >60)
GLUCOSE: 102 mg/dL — AB (ref 65–99)
POTASSIUM: 4.1 mmol/L (ref 3.5–5.1)
SODIUM: 133 mmol/L — AB (ref 135–145)
Total Bilirubin: 1.7 mg/dL — ABNORMAL HIGH (ref 0.3–1.2)
Total Protein: 6 g/dL — ABNORMAL LOW (ref 6.5–8.1)

## 2015-09-03 LAB — IRON AND TIBC
Iron: 89 ug/dL (ref 45–182)
Saturation Ratios: 52 % — ABNORMAL HIGH (ref 17.9–39.5)
TIBC: 172 ug/dL — ABNORMAL LOW (ref 250–450)
UIBC: 83 ug/dL

## 2015-09-03 LAB — FOLATE: Folate: 43.7 ng/mL (ref >5.9)

## 2015-09-03 LAB — FERRITIN: FERRITIN: 974 ng/mL — AB (ref 24–336)

## 2015-09-03 LAB — RETICULOCYTES
RBC.: 3.39 MIL/uL — AB (ref 4.22–5.81)
RETIC COUNT ABSOLUTE: 78 10*3/uL (ref 19.0–186.0)
Retic Ct Pct: 2.3 % (ref 0.4–3.1)

## 2015-09-03 LAB — VITAMIN B12: VITAMIN B 12: 1077 pg/mL — AB (ref 180–914)

## 2015-09-03 MED ORDER — CEFUROXIME AXETIL 500 MG PO TABS
500.0000 mg | ORAL_TABLET | Freq: Two times a day (BID) | ORAL | Status: DC
  Administered 2015-09-04: 500 mg via ORAL
  Filled 2015-09-03: qty 1

## 2015-09-03 MED ORDER — LATANOPROST 0.005 % OP SOLN
1.0000 [drp] | Freq: Every day | OPHTHALMIC | Status: DC
  Administered 2015-09-03 – 2015-09-11 (×8): 1 [drp] via OPHTHALMIC
  Filled 2015-09-03 (×2): qty 2.5

## 2015-09-03 MED ORDER — TIMOLOL HEMIHYDRATE 0.5 % OP SOLN: 1.0000 [drp] | Freq: Every day | OPHTHALMIC | Status: DC

## 2015-09-03 MED ORDER — CALCIUM CARBONATE ANTACID 500 MG PO CHEW: 1.0000 | CHEWABLE_TABLET | Freq: Two times a day (BID) | ORAL | Status: DC

## 2015-09-03 MED ORDER — FOLIC ACID 1 MG PO TABS
1.0000 mg | ORAL_TABLET | Freq: Every day | ORAL | Status: DC
  Administered 2015-09-03 – 2015-09-12 (×10): 1 mg via ORAL
  Filled 2015-09-03 (×10): qty 1

## 2015-09-03 MED ORDER — SENNOSIDES-DOCUSATE SODIUM 8.6-50 MG PO TABS
1.0000 | ORAL_TABLET | Freq: Two times a day (BID) | ORAL | Status: DC
  Administered 2015-09-03 – 2015-09-12 (×18): 1 via ORAL
  Filled 2015-09-03 (×19): qty 1

## 2015-09-03 MED ORDER — ALLOPURINOL 100 MG PO TABS
100.0000 mg | ORAL_TABLET | Freq: Every day | ORAL | Status: DC
  Administered 2015-09-03 – 2015-09-12 (×10): 100 mg via ORAL
  Filled 2015-09-03 (×10): qty 1

## 2015-09-03 MED ORDER — TIMOLOL MALEATE 0.5 % OP SOLN
1.0000 [drp] | Freq: Every day | OPHTHALMIC | Status: DC
  Administered 2015-09-03 – 2015-09-12 (×10): 1 [drp] via OPHTHALMIC
  Filled 2015-09-03: qty 5

## 2015-09-03 MED ORDER — SIMVASTATIN 20 MG PO TABS
20.0000 mg | ORAL_TABLET | Freq: Every day | ORAL | Status: DC
  Administered 2015-09-03 – 2015-09-11 (×9): 20 mg via ORAL
  Filled 2015-09-03 (×9): qty 1

## 2015-09-03 NOTE — Progress Notes (Signed)
Transfer Note:                       Arrival Method: Bed Mental Orientation: Alert and oriented to person, situation Telemetry: discontinued upon transfer Assessment: Completed Skin: Assessed by 2 RNs. Noted significant bruising to R side lower back, bruising to upper back, skin tear, dark purply to L hip with foam covering; sacral dressing for prevention; abrasion to 2nd toe R foot; and bruising to L pinky toe. Multiple bruising and thin skin to bilateral upper extremities.  IV: 2 NSL to R arm assessed by IV team and removed. New PIV inserted by IV team. IV team removing R TLC.  Pain: Patient denies. VSS. O2 at 2 l/m with O2  Sat 100%. Tubes: N/A Safety Measures: Bed alarm initiated.  [place] Orientation: Patient has been orientated to the room, unit and the staff. Family:  No family at bedside. Transfer orders released. Will continue to monitor the patient. Call light has been placed within reach.  Manya Silvas, RN  Phone Number: [phone]

## 2015-09-03 NOTE — Progress Notes (Signed)
[place] TEAM 1 - Stepdown/ICU TEAM  [name]  XLK:[id number] DOB: December 08, 1926 DOA: 08/28/2015 PCP: Raymond Sheriff., MD    Brief Narrative:  82yo male with hx of AoS, Afib, ESRD, HTN, and Guillain-Barre who presented 7/31 with c/o weakness, malaise and a fall at home.  In the ER he had progressive hypotension despite volume and chronic R sided effusion (previously thought ?malignant) with possible infiltrate on CXR.  PCCM admitted the pt to the ICU.     Subjective: The patient is significantly more alert and interactive today.  He is oriented 4.  He denies chest pain shortness breath fevers or chills.  He did enjoy a large breakfast this morning.  Assessment & Plan:  RLL PNA  Completed a 5 day course of empiric abx   E. coli bacteremia  Source not clear - ?E coli PNA - plan to complete a 14 day course of antibiotic therapy for this indication - transition to oral meds after 7 days of IV tx   Chronic R effusion - subacute L effusion  had thora 02/14/2015 X 1 liter w/ glucose 106 WBC 160 with L > P, cyt POS atypical cells - has been evaluated by PCCM - signif L sided effusion noted on f/u CXR 8/5 AM, progressing since prior CXRs - clinically stable at present - follow up CXR tomorrow after HD    Hypotension - acute on chronic  Sepsis w/ bacteremia on baseline of chronic hypotension - now off pressors - midodrine continues - blood pressure stable presently  Chronic AFib not on anticoagulation - heart rate well controlled  ESRD on MWF HD Nephrology following   Macrocytic anemia D66 and folic acid levels normal/high  Hypokalemia  Corrected w/ HD   Elevated alk phos / T bil Now on a downward trend - no clear etiology - potentially related to chronic right pleural effusion  Thrombocytopenia  Likely due to gram-negative bacteremia - improving  Hypothyroid   Continue home synthroid  Hx seizures  Continue home lamictal   History [name]  DVT prophylaxis:  SCDs Code Status: NO INTUBATION - NO CPR - NO DEFIB Family Communication: spoke w/ wife and 2 sons at bedside  Disposition Plan: Transfer to renal floor - PT/OT to continue - possible need for CIR   Consultants:  Nephrology PCCM  Procedures: none  Antimicrobials:  Azithromycin 7/31 > 8/04 Cefepime 7/31 Rocephin 8/1 >  Objective: Blood pressure (!) 158/108, pulse 75, temperature 97.3 F (36.3 C), temperature source Oral, resp. rate 12, height 5' 9"  (1.753 m), weight 71.9 kg (158 lb 8.2 oz), SpO2 95 %.  Intake/Output Summary (Last 24 hours) at 09/03/15 1123 Last data filed at 09/03/15 0856  Gross per 24 hour  Intake              823 ml  Output                0 ml  Net              823 ml   Filed Weights   09/01/15 1059 09/02/15 0418 09/03/15 0500  Weight: 72.2 kg (159 lb 2.8 oz) 71.8 kg (158 lb 4.6 oz) 71.9 kg (158 lb 8.2 oz)    Examination: General: No acute respiratory distress - alert and conversant  Lungs: Poor air movement bilateral bases - no wheezing Cardiovascular: Regular rate without murmur or rub  Abdomen: Nontender, nondistended, soft, bowel sounds positive, no rebound, no ascites, no appreciable mass Extremities:  No significant cyanosis, clubbing, or edema bilateral lower extremities  CBC:  Recent Labs Lab 08/28/15 0807  08/30/15 0422 08/31/15 0720 09/01/15 0500 09/02/15 0527 09/03/15 0524  WBC 5.8  < > 5.6 7.5 5.0 5.1 6.4  NEUTROABS 5.0  --   --   --   --   --   --   HGB 9.5*  < > 9.9* 10.3* 10.4* 11.0* 11.2*  HCT 31.0*  < > 31.1* 32.8* 32.8* 34.6* 34.9*  MCV 107.6*  < > 103.0* 105.5* 103.1* 103.6* 102.9*  PLT 50*  < > 51* 70* 69* 86* 90*  < > = values in this interval not displayed. Basic Metabolic Panel:  Recent Labs Lab 08/30/15 0422 08/31/15 0720 09/01/15 0500 09/02/15 0527 09/03/15 0524  NA 129* 131* 132* 133* 133*  K 3.9 3.7 4.4 4.2 4.1  CL 92* 93* 92* 93* 92*  CO2 24 28 27 28 27   GLUCOSE 163* 116* 94 121* 102*  BUN 41* 23* 29*  18 26*  CREATININE 6.00* 3.88* 4.84* 3.32* 4.09*  CALCIUM 8.2* 8.3* 8.5* 8.7* 9.1  MG 2.1 1.9 2.5*  --   --   PHOS 4.9* 3.9 4.9*  --   --    GFR: Estimated Creatinine Clearance: 13 mL/min (by C-G formula based on SCr of 4.09 mg/dL).  Liver Function Tests:  Recent Labs Lab 08/28/15 0807 08/29/15 0930 09/02/15 0527 09/03/15 0524  AST 133* 97* 252* 176*  ALT 38 26 99* 98*  ALKPHOS 332* 249* 487* 403*  BILITOT 3.7* 2.6* 2.3* 1.7*  PROT 5.7* 5.4* 5.8* 6.0*  ALBUMIN 2.8* 2.5* 2.7* 2.7*    Coagulation Profile:  Recent Labs Lab 08/28/15 0807  INR 1.29    Cardiac Enzymes:  Recent Labs Lab 08/28/15 1255 08/28/15 1836 08/29/15 0026  TROPONINI 0.09* 0.16* 0.20*   CBG:  Recent Labs Lab 08/28/15 0902  GLUCAP 87    Recent Results (from the past 240 hour(s))  Blood culture (routine x 2)     Status: None   Collection Time: 08/28/15  8:27 AM  Result Value Ref Range Status   Specimen Description BLOOD RIGHT ANTECUBITAL  Final   Special Requests BOTTLES DRAWN AEROBIC AND ANAEROBIC  5CC  Final   Culture NO GROWTH 5 DAYS  Final   Report Status 09/02/2015 FINAL  Final  Blood culture (routine x 2)     Status: Abnormal   Collection Time: 08/28/15  8:30 AM  Result Value Ref Range Status   Specimen Description BLOOD RIGHT HAND  Final   Special Requests BOTTLES DRAWN AEROBIC AND ANAEROBIC  5CC  Final   Culture  Setup Time   Final    GRAM NEGATIVE RODS IN BOTH AEROBIC AND ANAEROBIC BOTTLES CRITICAL RESULT CALLED TO, READ BACK BY AND VERIFIED WITHAlona Bene PHARMD 0305 08/29/15 A BROWNING    Culture ESCHERICHIA COLI (A)  Final   Report Status 09/01/2015 FINAL  Final   Organism ID, Bacteria ESCHERICHIA COLI  Final      Susceptibility   Escherichia coli - MIC*    AMPICILLIN >=32 RESISTANT Resistant     CEFAZOLIN <=4 SENSITIVE Sensitive     CEFEPIME <=1 SENSITIVE Sensitive     CEFTAZIDIME <=1 SENSITIVE Sensitive     CEFTRIAXONE <=1 SENSITIVE Sensitive     CIPROFLOXACIN <=0.25  SENSITIVE Sensitive     GENTAMICIN <=1 SENSITIVE Sensitive     IMIPENEM <=0.25 SENSITIVE Sensitive     TRIMETH/SULFA <=20 SENSITIVE Sensitive  AMPICILLIN/SULBACTAM 16 INTERMEDIATE Intermediate     PIP/TAZO <=4 SENSITIVE Sensitive     Extended ESBL NEGATIVE Sensitive     * ESCHERICHIA COLI  Blood Culture ID Panel (Reflexed)     Status: Abnormal   Collection Time: 08/28/15  8:30 AM  Result Value Ref Range Status   Enterococcus species NOT DETECTED NOT DETECTED Final   Vancomycin resistance NOT DETECTED NOT DETECTED Final   Listeria monocytogenes NOT DETECTED NOT DETECTED Final   Staphylococcus species NOT DETECTED NOT DETECTED Final   Staphylococcus aureus NOT DETECTED NOT DETECTED Final   Methicillin resistance NOT DETECTED NOT DETECTED Final   Streptococcus species NOT DETECTED NOT DETECTED Final   Streptococcus agalactiae NOT DETECTED NOT DETECTED Final   Streptococcus pneumoniae NOT DETECTED NOT DETECTED Final   Streptococcus pyogenes NOT DETECTED NOT DETECTED Final   Acinetobacter baumannii NOT DETECTED NOT DETECTED Final   Enterobacteriaceae species DETECTED (A) NOT DETECTED Final    Comment: CRITICAL RESULT CALLED TO, READ BACK BY AND VERIFIED WITH: V BRYK PHARMD 0305 08/29/15 A BROWNING    Enterobacter cloacae complex NOT DETECTED NOT DETECTED Final   Escherichia coli DETECTED (A) NOT DETECTED Final    Comment: CRITICAL RESULT CALLED TO, READ BACK BY AND VERIFIED WITHAlona Bene PHARMD 0305 08/29/15 A BROWNING    Klebsiella oxytoca NOT DETECTED NOT DETECTED Final   Klebsiella pneumoniae NOT DETECTED NOT DETECTED Final   Proteus species NOT DETECTED NOT DETECTED Final   Serratia marcescens NOT DETECTED NOT DETECTED Final   Carbapenem resistance NOT DETECTED NOT DETECTED Final   Haemophilus influenzae NOT DETECTED NOT DETECTED Final   Neisseria meningitidis NOT DETECTED NOT DETECTED Final   Pseudomonas aeruginosa NOT DETECTED NOT DETECTED Final   Candida albicans NOT DETECTED  NOT DETECTED Final   Candida glabrata NOT DETECTED NOT DETECTED Final   Candida krusei NOT DETECTED NOT DETECTED Final   Candida parapsilosis NOT DETECTED NOT DETECTED Final   Candida tropicalis NOT DETECTED NOT DETECTED Final  MRSA PCR Screening     Status: None   Collection Time: 08/28/15  3:02 PM  Result Value Ref Range Status   MRSA by PCR NEGATIVE NEGATIVE Final    Comment:        The GeneXpert MRSA Assay (FDA approved for NASAL specimens only), is one component of a comprehensive MRSA colonization surveillance program. It is not intended to diagnose MRSA infection nor to guide or monitor treatment for MRSA infections.      Scheduled Meds: . antiseptic oral rinse  7 mL Mouth Rinse q12n4p  . calcitRIOL  0.25 mcg Oral Q M,W,F-1800  . calcium carbonate  1 tablet Oral TID WC  . cefTRIAXone (ROCEPHIN)  IV  2 g Intravenous Q24H  . chlorhexidine  15 mL Mouth Rinse BID  . darbepoetin (ARANESP) injection - DIALYSIS  60 mcg Intravenous Q Fri-HD  . lamoTRIgine  100 mg Oral QHS  . lamoTRIgine  50 mg Oral Daily  . latanoprost  1 drop Both Eyes QHS  . levothyroxine  75 mcg Oral QAC breakfast  . midodrine  10 mg Oral Q6H  . QUEtiapine  12.5 mg Oral QHS  . sertraline  25 mg Oral Daily  . timolol  1 drop Both Eyes Daily     LOS: 6 days   Cherene Altes, MD [facility] Office  [phone] Pager - Text Page per Amion as per below:  On-Call/Text Page:      [email or web address]      password  [email or web address]  If 7PM-7AM, please contact night-coverage [email or web address] Password [email or web address] 09/03/2015, 11:23 AM

## 2015-09-03 NOTE — Progress Notes (Signed)
Report called to Abigail Butts RN Pt to transfer to 6 [address] bed 78 Family  Was aware that pt will transfer just not where. Call made to family

## 2015-09-03 NOTE — Progress Notes (Signed)
Subjective:   very weak.   Confused still this AM Objective Vital signs in last 24 hours: Vitals:   09/03/15 0300 09/03/15 0400 09/03/15 0500 09/03/15 0600  BP:  (!) 113/58  139/71  Pulse: 62 (!) 138  82  Resp: 17 17  10   Temp:   97.6 F (36.4 C)   TempSrc:   Oral   SpO2: 97% 93%  95%  Weight:   71.9 kg (158 lb 8.2 oz)   Height:       Weight change: -0.3 kg (-10.6 oz)  Intake/Output Summary (Last 24 hours) at 09/03/15 0745 Last data filed at 09/03/15 0000  Gross per 24 hour  Intake              587 ml  Output                0 ml  Net              587 ml   Dialyzes at [facility] MWF- 3 hours and 15 min EDW 64- getting close as OP. HD Bath 2K/2.25 ca, Dialyzer 160, Heparin no. Access AVF. 450 BFR- venofer 50 weekly, mircera 75 q 2 weeks- given 7/19- last hgb 10/4  On TUMS for binding and calcitriol 0.25- last ca=9.7, phos 3.6, PTH 77   Assessment/Plan: 82 year old WM with multiple medical problems including ESRD presenting with weakness and found to have hypotension beyond normal hypotension for him 1 Hypotension- thought to possibly be related to sepsis-  positive blood cultures- E coli.  Previously on pressors- now weaned off.  Seems to be making progress- he looks better to me clinically  2 ESRD: normally MWF at [facility] via AVF- missed Monday and given his BP did not think he would tolerate.  did Wednesday and Friday.   Normally BP does get down to the 80's on regular HD.    next HD on Monday - over his EDW due to missed HD- removal as able tomorrow  3 Sepsis: placed on azithro and rocephin for possible pulmonary infection - now has positive blood cultures for GNR-per CCM- is a do not intubate- clinically improving 4. Anemia of ESRD: on mircera and venofer as OP-  due ESA this week(ordered)  hgb 11 5. Metabolic Bone Disease: normally on calcitriol and tums- have resumed 6. Hyponatremia- due to volume overload- removal with HD  7. Dispo- PT /OT- I am afraid these events will take him down a  notch- considering inpatient rehab- not sure if will be able due to confusion     Jabri Blancett A    Labs: Basic Metabolic Panel:  Recent Labs Lab 08/30/15 0422 08/31/15 0720 09/01/15 0500 09/02/15 0527 09/03/15 0524  NA 129* 131* 132* 133* 133*  K 3.9 3.7 4.4 4.2 4.1  CL 92* 93* 92* 93* 92*  CO2 24 28 27 28 27   GLUCOSE 163* 116* 94 121* 102*  BUN 41* 23* 29* 18 26*  CREATININE 6.00* 3.88* 4.84* 3.32* 4.09*  CALCIUM 8.2* 8.3* 8.5* 8.7* 9.1  PHOS 4.9* 3.9 4.9*  --   --    Liver Function Tests:  Recent Labs Lab 08/29/15 0930 09/02/15 0527 09/03/15 0524  AST 97* 252* 176*  ALT 26 99* 98*  ALKPHOS 249* 487* 403*  BILITOT 2.6* 2.3* 1.7*  PROT 5.4* 5.8* 6.0*  ALBUMIN 2.5* 2.7* 2.7*   No results for input(s): LIPASE, AMYLASE in the last 168 hours. No results for input(s): AMMONIA in the last 168 hours. CBC:  Recent Labs Lab 08/28/15 0807  08/30/15 0422 08/31/15 0720 09/01/15 0500 09/02/15 0527 09/03/15 0524  WBC 5.8  < > 5.6 7.5 5.0 5.1 6.4  NEUTROABS 5.0  --   --   --   --   --   --   HGB 9.5*  < > 9.9* 10.3* 10.4* 11.0* 11.2*  HCT 31.0*  < > 31.1* 32.8* 32.8* 34.6* 34.9*  MCV 107.6*  < > 103.0* 105.5* 103.1* 103.6* 102.9*  PLT 50*  < > 51* 70* 69* 86* 90*  < > = values in this interval not displayed. Cardiac Enzymes:  Recent Labs Lab 08/28/15 1255 08/28/15 1836 08/29/15 0026  TROPONINI 0.09* 0.16* 0.20*   CBG:  Recent Labs Lab 08/28/15 0902  GLUCAP 87    Iron Studies:   Recent Labs  09/03/15 0524  IRON 89  TIBC 172*  FERRITIN 974*   Studies/Results: Dg Chest Port 1 View  Result Date: 09/02/2015 CLINICAL DATA:  Pneumonia, hypertension EXAM: PORTABLE CHEST 1 VIEW COMPARISON:  08/29/2015 FINDINGS: RIGHT history venous line noted. Stable cardiac silhouette. There is large LEFT effusion with dense LEFT basilar atelectasis. Moderate RIGHT effusion also noted. Some improvement in upper lobe airspace disease. IMPRESSION: 1. Dense LEFT  lower lobe atelectasis with large effusion. Cannot exclude LEFT lower lobe mass. 2. Moderate RIGHT effusion. 3. Some improvement in interstitial/pulmonary edema in the upper lobes. Electronically Signed   By: Suzy Bouchard M.D.   On: 09/02/2015 08:58   Medications: Infusions: . sodium chloride 10 mL/hr at 09/02/15 1847    Scheduled Medications: . antiseptic oral rinse  7 mL Mouth Rinse q12n4p  . calcitRIOL  0.25 mcg Oral Q M,W,F-1800  . calcium carbonate  1 tablet Oral TID WC  . cefTRIAXone (ROCEPHIN)  IV  2 g Intravenous Q24H  . chlorhexidine  15 mL Mouth Rinse BID  . darbepoetin (ARANESP) injection - DIALYSIS  60 mcg Intravenous Q Fri-HD  . lamoTRIgine  100 mg Oral QHS  . lamoTRIgine  50 mg Oral Daily  . levothyroxine  75 mcg Oral QAC breakfast  . midodrine  10 mg Oral Q6H  . QUEtiapine  12.5 mg Oral QHS  . sertraline  25 mg Oral Daily    have reviewed scheduled and prn medications.  Physical Exam: General:alert -  confused  Heart: RRR Lungs: poor effort Abdomen: soft, non tender Extremities: pitting peripheral edema- better  Dialysis Access: left AVF-- aneurysmal but does not look infected    09/03/2015,7:45 AM  LOS: 6 days

## 2015-09-04 ENCOUNTER — Inpatient Hospital Stay (HOSPITAL_COMMUNITY): Payer: Medicare HMO

## 2015-09-04 DIAGNOSIS — R5381 Other malaise: Secondary | ICD-10-CM

## 2015-09-04 DIAGNOSIS — D696 Thrombocytopenia, unspecified: Secondary | ICD-10-CM

## 2015-09-04 DIAGNOSIS — R569 Unspecified convulsions: Secondary | ICD-10-CM

## 2015-09-04 DIAGNOSIS — F0390 Unspecified dementia without behavioral disturbance: Secondary | ICD-10-CM

## 2015-09-04 DIAGNOSIS — I480 Paroxysmal atrial fibrillation: Secondary | ICD-10-CM

## 2015-09-04 DIAGNOSIS — D539 Nutritional anemia, unspecified: Secondary | ICD-10-CM

## 2015-09-04 DIAGNOSIS — J90 Pleural effusion, not elsewhere classified: Secondary | ICD-10-CM

## 2015-09-04 DIAGNOSIS — R7881 Bacteremia: Secondary | ICD-10-CM

## 2015-09-04 DIAGNOSIS — J189 Pneumonia, unspecified organism: Secondary | ICD-10-CM

## 2015-09-04 LAB — RENAL FUNCTION PANEL
ALBUMIN: 2.5 g/dL — AB (ref 3.5–5.0)
ANION GAP: 11 (ref 5–15)
BUN: 34 mg/dL — ABNORMAL HIGH (ref 6–20)
CALCIUM: 9 mg/dL (ref 8.9–10.3)
CO2: 28 mmol/L (ref 22–32)
Chloride: 93 mmol/L — ABNORMAL LOW (ref 101–111)
Creatinine, Ser: 4.8 mg/dL — ABNORMAL HIGH (ref 0.61–1.24)
GFR, EST AFRICAN AMERICAN: 11 mL/min — AB (ref >60)
GFR, EST NON AFRICAN AMERICAN: 10 mL/min — AB (ref >60)
Glucose, Bld: 79 mg/dL (ref 65–99)
PHOSPHORUS: 3.7 mg/dL (ref 2.5–4.6)
Potassium: 4.2 mmol/L (ref 3.5–5.1)
SODIUM: 132 mmol/L — AB (ref 135–145)

## 2015-09-04 LAB — CBC
HEMATOCRIT: 33.1 % — AB (ref 39.0–52.0)
HEMOGLOBIN: 10.7 g/dL — AB (ref 13.0–17.0)
MCH: 33.8 pg (ref 26.0–34.0)
MCHC: 32.3 g/dL (ref 30.0–36.0)
MCV: 104.4 fL — ABNORMAL HIGH (ref 78.0–100.0)
Platelets: 87 10*3/uL — ABNORMAL LOW (ref 150–400)
RBC: 3.17 MIL/uL — AB (ref 4.22–5.81)
RDW: 16.9 % — ABNORMAL HIGH (ref 11.5–15.5)
WBC: 7 10*3/uL (ref 4.0–10.5)

## 2015-09-04 MED ORDER — CEFUROXIME AXETIL 500 MG PO TABS
500.0000 mg | ORAL_TABLET | Freq: Every day | ORAL | Status: AC
  Administered 2015-09-05 – 2015-09-10 (×6): 500 mg via ORAL
  Filled 2015-09-04 (×8): qty 1

## 2015-09-04 NOTE — Progress Notes (Signed)
Subjective:   Remains very weak. No new c/o  Objective Vital signs in last 24 hours:  Vitals:   09/04/15 0930 09/04/15 1000 09/04/15 1030 09/04/15 1039  BP: 129/74 123/66 120/65 (!) 145/79  Pulse: (!) 57 69 65 71  Resp: 14 14 14 14   Temp:    97.6 F (36.4 C)  TempSrc:    Axillary  SpO2:      Weight:    71.1 kg (156 lb 12 oz)  Height:       Weight change: 5.121 kg (11 lb 4.6 oz)  Intake/Output Summary (Last 24 hours) at 09/04/15 1220 Last data filed at 09/04/15 1039  Gross per 24 hour  Intake              715 ml  Output             3000 ml  Net            -2285 ml   D[facility]: [facility] MWF  3h 41min  64kg  2/2.25 bath  Heparin none  L arm AVF  Venofer 50 weekly, mircera 75 q 2 weeks- given 7/19  Last hgb 10/4  On TUMS for binding and calcitriol 0.25- last ca=9.7, phos 3.6, PTH 77   Assessment/Plan: 82 year old WM with multiple medical problems including ESRD presenting with weakness and found to have hypotension beyond normal hypotension for him  Assessment: 1 Hypotension- related to [name] sepsis/ bacteremia.  Poss from PNA.  Improved somewhat 2 ESRD: MWF [place] up 5-6kg by wts 3 Hyponatremia - due to vol excess, improving 4 Anemia of ESRD: on mircera and venofer as OP-  due ESA this week(ordered)  hgb 11 5 Metabolic Bone Disease: normally on calcitriol and tums- have resumed 6 Dispo- quite debilitated, unsure disposition 7 AMS - seems a little better today  Plan - HD today, max UF as Ronnie Derby MD [facility] pager [phone]    cell [phone] 09/04/2015, 12:24 PM     Labs: Basic Metabolic Panel:  Recent Labs Lab 08/31/15 0720 09/01/15 0500 09/02/15 0527 09/03/15 0524 09/04/15 0705  NA 131* 132* 133* 133* 132*  K 3.7 4.4 4.2 4.1 4.2  CL 93* 92* 93* 92* 93*  CO2 28 27 28 27 28   GLUCOSE 116* 94 121* 102* 79  BUN 23* 29* 18 26* 34*  CREATININE 3.88* 4.84* 3.32* 4.09* 4.80*  CALCIUM 8.3* 8.5* 8.7* 9.1 9.0  PHOS 3.9 4.9*  --   --  3.7    Liver Function Tests:  Recent Labs Lab 08/29/15 0930 09/02/15 0527 09/03/15 0524 09/04/15 0705  AST 97* 252* 176*  --   ALT 26 99* 98*  --   ALKPHOS 249* 487* 403*  --   BILITOT 2.6* 2.3* 1.7*  --   PROT 5.4* 5.8* 6.0*  --   ALBUMIN 2.5* 2.7* 2.7* 2.5*   No results for input(s): LIPASE, AMYLASE in the last 168 hours. No results for input(s): AMMONIA in the last 168 hours. CBC:  Recent Labs Lab 08/31/15 0720 09/01/15 0500 09/02/15 0527 09/03/15 0524 09/04/15 0705  WBC 7.5 5.0 5.1 6.4 7.0  HGB 10.3* 10.4* 11.0* 11.2* 10.7*  HCT 32.8* 32.8* 34.6* 34.9* 33.1*  MCV 105.5* 103.1* 103.6* 102.9* 104.4*  PLT 70* 69* 86* 90* 87*   Cardiac Enzymes:  Recent Labs Lab 08/28/15 1255 08/28/15 1836 08/29/15 0026  TROPONINI 0.09* 0.16* 0.20*   CBG: No results for input(s): GLUCAP in the last 168 hours.  Iron Studies:   Recent Labs  09/03/15 0524  IRON 89  TIBC 172*  FERRITIN 974*   Studies/Results: Dg Chest Port 1 View  Result Date: 09/04/2015 CLINICAL DATA:  Pleural effusions. Short of breath. Subsequent encounter. EXAM: PORTABLE CHEST 1 VIEW COMPARISON:  09/02/2015 FINDINGS: Large left and moderate right pleural effusions upper similar to the prior exam. There is vascular congestion and interstitial thickening most evident centrally and in the lung bases. Cardiac silhouette is mostly obscured.  No pneumothorax. IMPRESSION: 1. Pulmonary edema with large left and moderate right pleural effusions. Additional lung base opacity most evident on the left. This is likely atelectasis. Pneumonia is possible. 2. Status post removal of the right internal jugular central venous line. No other significant change from the prior study. Electronically Signed   By: Lajean Manes M.D.   On: 09/04/2015 08:20   Medications: Infusions:    Scheduled Medications: . allopurinol  100 mg Oral Daily  . antiseptic oral rinse  7 mL Mouth Rinse q12n4p  . calcitRIOL  0.25 mcg Oral Q M,W,F-1800   . calcium carbonate  1 tablet Oral TID WC  . cefUROXime  500 mg Oral BID WC  . chlorhexidine  15 mL Mouth Rinse BID  . darbepoetin (ARANESP) injection - DIALYSIS  60 mcg Intravenous Q Fri-HD  . folic acid  1 mg Oral Daily  . lamoTRIgine  100 mg Oral QHS  . lamoTRIgine  50 mg Oral Daily  . latanoprost  1 drop Both Eyes QHS  . levothyroxine  75 mcg Oral QAC breakfast  . midodrine  10 mg Oral Q6H  . QUEtiapine  12.5 mg Oral QHS  . senna-docusate  1 tablet Oral BID  . sertraline  25 mg Oral Daily  . simvastatin  20 mg Oral QHS  . timolol  1 drop Both Eyes Daily    have reviewed scheduled and prn medications.  Physical Exam: General:alert -  confused  Heart: RRR Lungs: poor effort Abdomen: soft, non tender Extremities: pitting peripheral edema- better  Dialysis Access: left AVF-- aneurysmal but does not look infected    09/04/2015,12:20 PM  LOS: 7 days

## 2015-09-04 NOTE — Progress Notes (Signed)
PT Cancellation Note  Patient Details Name: [name] MRN: [id number] DOB: 03/18/1926   Cancelled Treatment:    Reason Eval/Treat Not Completed: Patient at procedure or test/unavailable (HD)   Pt in HD this am. Will attempt to see later today, as time allows; otherwise will follow up tomorrow.   Jabier Mutton, SPT Acute Rehab Services [phone]    Jabier Mutton 09/04/2015, 10:03 AM

## 2015-09-04 NOTE — Consult Note (Addendum)
Physical Medicine and Rehabilitation Consult  Reason for Consult: Debility Referring Physician: Dr. Thereasa Solo   HPI: [name] is a 82 y.o. male with history fo A fib, GBS, seizure disorder, dementia, ESRD,  Chronic right pleural effusion who was admitted on 08/28/15 with multiple falls and progressive decline over past 3-4 weeks.  History taken from chart review. He was found to be hypotensive with concerns of right CAP and E coli/enterobacter bacteremia. He required pressors but blood pressures stabilized with improvement in mentation. He has been able to tolerate HD with midodrine on board and to continue 14 day course of antibiotic therapy for bacteremia due to unknown source. PT evaluation done revealing deconditioning and CIR recommended for follow up therapy.   Review of Systems  Unable to perform ROS: Mental acuity  Eyes: Negative for blurred vision.  Respiratory: Negative for shortness of breath.   Gastrointestinal: Negative for abdominal pain.  Musculoskeletal: Negative for myalgias.  All other systems reviewed and are negative.     Past Medical History:  Diagnosis Date  . Acute infective polyneuritis ([place])   . Anemia    of chronic disease  . Aortic stenosis   . Atrial fibrillation ([place])   . Benign prostatic hypertrophy   . Colon cancer ([place])   . Colon polyp    adenomatous  . Dementia   . Depression   . Diverticulosis   . Encounter for long-term (current) use of other medications   . End stage renal disease ([place])   . Gout   . Hearing loss   . Hernia of unspecified site of abdominal cavity without mention of obstruction or gangrene    hiatal  . History of Clostridium difficile infection   . History of Guillain-Barre syndrome   . Hyperlipidemia   . Hyperparathyroidism   . Hyperparathyroidism ([place])   . Hypertension   . Hypothyroidism   . Internal hemorrhoids   . Pancytopenia ([place])   . Polyneuropathy in other diseases classified elsewhere ([place])   .  Proctitis   . Renal failure   . Seizures ([place])   . Unspecified hereditary and idiopathic peripheral neuropathy   . Unspecified transient cerebral ischemia     Past Surgical History:  Procedure Laterality Date  . KIDNEY TRANSPLANT     1998  . SKIN CANCER EXCISION      Family History  Problem Relation Age of Onset  . Stroke Father   . Diabetes Brother   . Liver disease Son   . Colon cancer Neg Hx     Social History:  Married. Independent with cane/rollator? Prior to few weeks ago. Per reports that he quit smoking about 54 years ago. His smoking use included Cigarettes. He has a 120.00 pack-year smoking history. He has never used smokeless tobacco. Per reports that he does not drink alcohol or use drugs.    Allergies: No Known Allergies    Medications Prior to Admission  Medication Sig Dispense Refill  . allopurinol (ZYLOPRIM) 100 MG tablet Take 100 mg by mouth daily.    Marland Kitchen aspirin 81 MG tablet Take 81 mg by mouth daily.      . B Complex-C-Folic Acid (DIALYVITE TABLET) TABS Take 1 tablet by mouth daily.     . calcium carbonate (TUMS - DOSED IN MG ELEMENTAL CALCIUM) 500 MG chewable tablet Chew 1 tablet by mouth 2 (two) times daily with a meal.     . carvedilol (COREG) 25 MG tablet Take 25 mg by mouth  at bedtime.     . Cholecalciferol (VITAMIN D-3 PO) Take 2 tablets by mouth every morning.     . folic acid (FOLVITE) 1 MG tablet Take 1 mg by mouth daily.      Marland Kitchen lamoTRIgine (LAMICTAL) 100 MG tablet TAKE 1/2 TABLET BY MOUTH EVERY MORNING AND 1 TABLET AT BEDTIME 135 tablet 3  . latanoprost (XALATAN) 0.005 % ophthalmic solution Place 1 drop into both eyes at bedtime.     Marland Kitchen levothyroxine (SYNTHROID, LEVOTHROID) 75 MCG tablet Take 75 mcg by mouth daily before breakfast.    . mirtazapine (REMERON) 30 MG tablet Take 1 tablet (30 mg total) by mouth at bedtime. 90 tablet 3  . omeprazole (PRILOSEC) 20 MG capsule Take 20 mg by mouth daily.    . sertraline (ZOLOFT) 25 MG tablet Take 1 tablet  (25 mg total) by mouth daily. 90 tablet 3  . simvastatin (ZOCOR) 20 MG tablet Take 20 mg by mouth at bedtime.      . timolol (BETIMOL) 0.5 % ophthalmic solution Place 1 drop into both eyes daily.      Home: Home Living Family/patient expects to be discharged to:: Unsure Living Arrangements: Spouse/significant other Available Help at Discharge: Family Type of Home: House Home Access: Ramped entrance [place]: One level [place]: Environmental consultant - 4 wheels, [place] - single point  Functional History: Prior Function Level of Independence: Independent with assistive device(s) Comments: Prior to 3-4 weeks ago; walking with cane most of the time; progressive weakness with progression to rollator and then unable to stand with fall to floor precipitating admission Functional Status:  Mobility: Bed Mobility Overal bed mobility: Needs Assistance Bed Mobility: Rolling, Sidelying to Sit, Sit to Sidelying Rolling: Mod assist Sidelying to sit: Max assist, +2 for physical assistance, HOB elevated Sit to sidelying: Mod assist, +2 for physical assistance General bed mobility comments: coming to EOB limited due to Rt knee pain and weakness; once seated, able to scoot to edge without assist Transfers Overall transfer level: Needs assistance Equipment used: Rolling walker (2 wheeled) Transfers: Sit to/from Stand Sit to Stand: Max assist, +2 physical assistance, From elevated surface General transfer comment: only cleared hip ~4" off bed on 2 attempts; pt with good effort      ADL:    Cognition: Cognition Orientation Level: Oriented X4 Cognition Arousal/Alertness: Awake/alert Behavior During Therapy: WFL for tasks assessed/performed Area of Impairment: Memory, Problem solving Memory: Decreased short-term memory Problem Solving: Slow processing, Difficulty sequencing, Requires verbal cues, Requires tactile cues General Comments: answers "I don't know to most questions" (even when pressed  further);    Blood pressure 123/70, pulse 61, temperature (!) 96.9 F (36.1 C), temperature source Oral, resp. rate 16, height 5\' 9"  (1.753 m), weight 74.3 kg (163 lb 12.8 oz), SpO2 97 %. Physical Exam  Nursing note and vitals reviewed. Constitutional: He appears well-developed and well-nourished. Nasal cannula in place.  Frail elderly male Easily fatigues with conversation  HENT:  Head: Normocephalic and atraumatic.  Eyes: Conjunctivae are normal. Right eye exhibits no discharge. Left eye exhibits no discharge.  Neck: Normal range of motion. Neck supple.  Cardiovascular: Normal rate and regular rhythm.   Respiratory: Effort normal. No stridor. He has decreased breath sounds.  GI: Soft. Bowel sounds are normal. He exhibits no distension. There is no tenderness.  Musculoskeletal: He exhibits edema. He exhibits no tenderness.  Moderate edema LUE  Neurological: He is alert.  Dysphonic but occasional wet sounds.  Oriented to self alone.  Slow movements but able to follow simple one step motor commands.  Motor: 4/5 throughout  Skin: Skin is warm and dry.  Psychiatric: His affect is blunt. His speech is delayed. He is slowed. Cognition and memory are impaired.    Results for orders placed or performed during the hospital encounter of 08/28/15 (from the past 24 hour(s))  CBC     Status: Abnormal   Collection Time: 09/04/15  7:05 AM  Result Value Ref Range   WBC 7.0 4.0 - 10.5 K/uL   RBC 3.17 (L) 4.22 - 5.81 MIL/uL   Hemoglobin 10.7 (L) 13.0 - 17.0 g/dL   HCT 33.1 (L) 39.0 - 52.0 %   MCV 104.4 (H) 78.0 - 100.0 fL   MCH 33.8 26.0 - 34.0 pg   MCHC 32.3 30.0 - 36.0 g/dL   RDW 16.9 (H) 11.5 - 15.5 %   Platelets 87 (L) 150 - 400 K/uL  Renal function panel     Status: Abnormal   Collection Time: 09/04/15  7:05 AM  Result Value Ref Range   Sodium 132 (L) 135 - 145 mmol/L   Potassium 4.2 3.5 - 5.1 mmol/L   Chloride 93 (L) 101 - 111 mmol/L   CO2 28 22 - 32 mmol/L   Glucose, Bld 79 65 -  99 mg/dL   BUN 34 (H) 6 - 20 mg/dL   Creatinine, Ser 4.80 (H) 0.61 - 1.24 mg/dL   Calcium 9.0 8.9 - 10.3 mg/dL   Phosphorus 3.7 2.5 - 4.6 mg/dL   Albumin 2.5 (L) 3.5 - 5.0 g/dL   GFR calc non Af Amer 10 (L) >60 mL/min   GFR calc Af Amer 11 (L) >60 mL/min   Anion gap 11 5 - 15   Dg Chest Port 1 View  Result Date: 09/04/2015 CLINICAL DATA:  Pleural effusions. Short of breath. Subsequent encounter. EXAM: PORTABLE CHEST 1 VIEW COMPARISON:  09/02/2015 FINDINGS: Large left and moderate right pleural effusions upper similar to the prior exam. There is vascular congestion and interstitial thickening most evident centrally and in the lung bases. Cardiac silhouette is mostly obscured.  No pneumothorax. IMPRESSION: 1. Pulmonary edema with large left and moderate right pleural effusions. Additional lung base opacity most evident on the left. This is likely atelectasis. Pneumonia is possible. 2. Status post removal of the right internal jugular central venous line. No other significant change from the prior study. Electronically Signed   By: Lajean Manes M.D.   On: 09/04/2015 08:20    Assessment/Plan: Diagnosis: Debility Labs and images independently reviewed.  Records reviewed and summated above.  1. Does the need for close, 24 hr/day medical supervision in concert with the patient's rehab needs make it unreasonable for this patient to be served in a less intensive setting? Yes 2. Co-Morbidities requiring supervision/potential complications: A fib (Monitor HR with increased physical activity), GBS, seizure disorder (cont meds), dementia (cont meds), ESRD (cont recs per [place]),  Chronic right pleural effusion (cont to monitor), Bacteremia (cont abx), macrocytic anemia (transfuse if necessary to ensure appropriate perfusion for increased activity tolerance), Thrombocytopenia (< 60,000/mm3 no resistive exercise) 3. Due to safety, skin/wound care, disease management, medication administration and patient  education, does the patient require 24 hr/day rehab nursing? Yes 4. Does the patient require coordinated care of a physician, rehab nurse, PT (1-2 hrs/day, 5 days/week), OT (1-2 hrs/day, 5 days/week) and SLP (1-2 hrs/day, 5 days/week) to address physical and functional deficits in the context of the above medical diagnosis(es)? Yes Addressing  deficits in the following areas: balance, endurance, locomotion, strength, transferring, bowel/bladder control, bathing, dressing, toileting, cognition, speech, swallowing and psychosocial support 5. Can the patient actively participate in an intensive therapy program of at least 3 hrs of therapy per day at least 5 days per week? Not at present 6. The potential for patient to make measurable gains while on inpatient rehab is excellent 7. Anticipated functional outcomes upon discharge from inpatient rehab are min assist and mod assist  with PT, min assist and mod assist with OT, modified independent and supervision with SLP. 8. Estimated rehab length of stay to reach the above functional goals is: 20-24 days. 9. Does the patient have adequate social supports and living environment to accommodate these discharge functional goals? No 10. Anticipated D/C setting: Other 11. Anticipated post D/C treatments: SNF 12. Overall Rehab/Functional Prognosis: good  RECOMMENDATIONS: This patient's condition is appropriate for continued rehabilitative care in the following setting: SNF Patient has agreed to participate in recommended program. Potentially Note that insurance prior authorization may be required for reimbursement for recommended care.  Comment: Rehab Admissions Coordinator to follow up.  Delice Lesch, MD 09/04/2015

## 2015-09-04 NOTE — Progress Notes (Signed)
Handoff report to Levada Dy, RN - Dialysis for patient's scheduled HD session this AM.  Patient status and update provided including AM meds administered (Midodrine and Cefuroxime).  Patient alert and oriented in NAD.  O2 at 2L via [place].  Vital signs stable.  No noted disctress, discomfort, or disturbance at this time.

## 2015-09-04 NOTE — Care Management Important Message (Signed)
Important Message  Patient Details  Name: [name] MRN: [id number] Date of Birth: [birth year]   Medicare Important Message Given:  Yes    [name] 09/04/2015, 2:03 PM

## 2015-09-04 NOTE — Progress Notes (Signed)
[facility] TEAM 1 - Stepdown/ICU TEAM  [name]  MCN:[id number] DOB: March 13, 1926 DOA: 08/28/2015 PCP: Angelina Sheriff., MD    Brief Narrative:  82yo male with hx of AoS, Afib, ESRD, HTN, and Guillain-Barre who presented 7/31 with c/o weakness, malaise and a fall at home.  In the ER he had progressive hypotension despite volume and chronic R sided effusion (previously thought ?malignant) with possible infiltrate on CXR.  PCCM admitted the pt to the ICU.     Subjective: The patient is resting comfortably in his room post hemodialysis.  He is in no respiratory distress.    Assessment & Plan:  RLL PNA  Completed a 5 day course of empiric abx   E. coli bacteremia  Source not clear - ?E coli PNA - plan to complete a 14 day course of antibiotic therapy for this indication - transitioned to oral meds after 7 days of IV tx   Chronic R effusion - subacute L effusion  had thora 02/14/2015 X 1 liter w/ glucose 106 WBC 160 with L > P, cyt POS atypical cells - has been evaluated by PCCM - signif L sided effusion noted on f/u CXR 8/5 AM, progressing since prior CXRs - clinically stable at present - follow up CXR after HD notes persistent left effusion but clinically patient stable presently - will need to follow and consider thoracentesis if hypoxia or respiratory distress becomes an issue   Hypotension - acute on chronic  Sepsis w/ bacteremia on baseline of chronic hypotension - now off pressors - midodrine continues - blood pressure stable   Chronic AFib not on anticoagulation - heart rate well controlled  ESRD on MWF HD Nephrology following - continues dialysis per usual schedule  Macrocytic anemia O29 and folic acid levels normal/high - hemoglobin stable  Hypokalemia  Corrected w/ HD   Elevated alk phos / T bil on a downward trend - no clear etiology - potentially related to chronic right pleural effusion  Thrombocytopenia  Likely due to gram-negative bacteremia - improving    Hypothyroid   Continue home synthroid  Hx seizures  Continue home lamictal   History [name]  DVT prophylaxis: SCDs Code Status: NO INTUBATION - NO CPR - NO DEFIB Family Communication: No family present at time of exam today Disposition Plan: PT/OT to continue - appears patient will require SNF for rehabilitation - follow left pleural effusion with possible need for thoracentesis   Consultants:  Nephrology PCCM  Procedures: none  Antimicrobials:  Azithromycin 7/31 > 8/04 Cefepime 7/31 Rocephin 8/1 > 8/6 Cefuroxime 8/6 >  Objective: Blood pressure 135/75, pulse 70, temperature 97.5 F (36.4 C), temperature source Oral, resp. rate 16, height _0  (1.753 m), weight 71.1 kg (156 lb 12 oz), SpO2 95 %.  Intake/Output Summary (Last 24 hours) at 09/04/15 1456 Last data filed at 09/04/15 1322  Gross per 24 hour  Intake              545 ml  Output             3000 ml  Net            -2455 ml   Filed Weights   09/03/15 2141 09/04/15 0714 09/04/15 1039  Weight: 77 kg (169 lb 12.8 oz) 74.3 kg (163 lb 12.8 oz) 71.1 kg (156 lb 12 oz)    Examination: General: No acute respiratory distress  Lungs: Poor air movement bilateral bases Cardiovascular: Regular rate without murmur  Abdomen: Nondistended,  soft, bowel sounds positive, no rebound Extremities: No significant cyanosis, clubbing, edema bilateral lower extremities  CBC:  Recent Labs Lab 08/31/15 0720 09/01/15 0500 09/02/15 0527 09/03/15 0524 09/04/15 0705  WBC 7.5 5.0 5.1 6.4 7.0  HGB 10.3* 10.4* 11.0* 11.2* 10.7*  HCT 32.8* 32.8* 34.6* 34.9* 33.1*  MCV 105.5* 103.1* 103.6* 102.9* 104.4*  PLT 70* 69* 86* 90* 87*   Basic Metabolic Panel:  Recent Labs Lab 08/30/15 0422 08/31/15 0720 09/01/15 0500 09/02/15 0527 09/03/15 0524 09/04/15 0705  NA 129* 131* 132* 133* 133* 132*  K 3.9 3.7 4.4 4.2 4.1 4.2  CL 92* 93* 92* 93* 92* 93*  CO2 _0 GLUCOSE 163* 116* 94 121* 102* 79  BUN 41*  23* 29* 18 26* 34*  CREATININE 6.00* 3.88* 4.84* 3.32* 4.09* 4.80*  CALCIUM 8.2* 8.3* 8.5* 8.7* 9.1 9.0  MG 2.1 1.9 2.5*  --   --   --   PHOS 4.9* 3.9 4.9*  --   --  3.7   GFR: Estimated Creatinine Clearance: 11 mL/min (by C-G formula based on SCr of 4.8 mg/dL).  Liver Function Tests:  Recent Labs Lab 08/29/15 0930 09/02/15 0527 09/03/15 0524 09/04/15 0705  AST 97* 252* 176*  --   ALT 26 99* 98*  --   ALKPHOS 249* 487* 403*  --   BILITOT 2.6* 2.3* 1.7*  --   PROT 5.4* 5.8* 6.0*  --   ALBUMIN 2.5* 2.7* 2.7* 2.5*    Cardiac Enzymes:  Recent Labs Lab 08/28/15 1836 08/29/15 0026  TROPONINI 0.16* 0.20*    Recent Results (from the past 240 hour(s))  Blood culture (routine x 2)     Status: None   Collection Time: 08/28/15  8:27 AM  Result Value Ref Range Status   Specimen Description BLOOD RIGHT ANTECUBITAL  Final   Special Requests BOTTLES DRAWN AEROBIC AND ANAEROBIC  5CC  Final   Culture NO GROWTH 5 DAYS  Final   Report Status 09/02/2015 FINAL  Final  Blood culture (routine x 2)     Status: Abnormal   Collection Time: 08/28/15  8:30 AM  Result Value Ref Range Status   Specimen Description BLOOD RIGHT HAND  Final   Special Requests BOTTLES DRAWN AEROBIC AND ANAEROBIC  5CC  Final   Culture  Setup Time   Final    GRAM NEGATIVE RODS IN BOTH AEROBIC AND ANAEROBIC BOTTLES CRITICAL RESULT CALLED TO, READ BACK BY AND VERIFIED WITHAlona Bene PHARMD 0305 08/29/15 A BROWNING    Culture ESCHERICHIA COLI (A)  Final   Report Status 09/01/2015 FINAL  Final   Organism ID, Bacteria ESCHERICHIA COLI  Final      Susceptibility   Escherichia coli - MIC*    AMPICILLIN >=32 RESISTANT Resistant     CEFAZOLIN <=4 SENSITIVE Sensitive     CEFEPIME <=1 SENSITIVE Sensitive     CEFTAZIDIME <=1 SENSITIVE Sensitive     CEFTRIAXONE <=1 SENSITIVE Sensitive     CIPROFLOXACIN <=0.25 SENSITIVE Sensitive     GENTAMICIN <=1 SENSITIVE Sensitive     IMIPENEM <=0.25 SENSITIVE Sensitive      TRIMETH/SULFA <=20 SENSITIVE Sensitive     AMPICILLIN/SULBACTAM 16 INTERMEDIATE Intermediate     PIP/TAZO <=4 SENSITIVE Sensitive     Extended ESBL NEGATIVE Sensitive     * ESCHERICHIA COLI  Blood Culture ID Panel (Reflexed)     Status: Abnormal   Collection Time: 08/28/15  8:30 AM  Result Value Ref  Range Status   Enterococcus species NOT DETECTED NOT DETECTED Final   Vancomycin resistance NOT DETECTED NOT DETECTED Final   Listeria monocytogenes NOT DETECTED NOT DETECTED Final   Staphylococcus species NOT DETECTED NOT DETECTED Final   Staphylococcus aureus NOT DETECTED NOT DETECTED Final   Methicillin resistance NOT DETECTED NOT DETECTED Final   Streptococcus species NOT DETECTED NOT DETECTED Final   Streptococcus agalactiae NOT DETECTED NOT DETECTED Final   Streptococcus pneumoniae NOT DETECTED NOT DETECTED Final   Streptococcus pyogenes NOT DETECTED NOT DETECTED Final   Acinetobacter baumannii NOT DETECTED NOT DETECTED Final   Enterobacteriaceae species DETECTED (A) NOT DETECTED Final    Comment: CRITICAL RESULT CALLED TO, READ BACK BY AND VERIFIED WITH: V BRYK PHARMD 0305 08/29/15 A BROWNING    Enterobacter cloacae complex NOT DETECTED NOT DETECTED Final   Escherichia coli DETECTED (A) NOT DETECTED Final    Comment: CRITICAL RESULT CALLED TO, READ BACK BY AND VERIFIED WITHAlona Bene PHARMD 0305 08/29/15 A BROWNING    Klebsiella oxytoca NOT DETECTED NOT DETECTED Final   Klebsiella pneumoniae NOT DETECTED NOT DETECTED Final   Proteus species NOT DETECTED NOT DETECTED Final   Serratia marcescens NOT DETECTED NOT DETECTED Final   Carbapenem resistance NOT DETECTED NOT DETECTED Final   Haemophilus influenzae NOT DETECTED NOT DETECTED Final   Neisseria meningitidis NOT DETECTED NOT DETECTED Final   Pseudomonas aeruginosa NOT DETECTED NOT DETECTED Final   Candida albicans NOT DETECTED NOT DETECTED Final   Candida glabrata NOT DETECTED NOT DETECTED Final   Candida krusei NOT DETECTED NOT  DETECTED Final   Candida parapsilosis NOT DETECTED NOT DETECTED Final   Candida tropicalis NOT DETECTED NOT DETECTED Final  MRSA PCR Screening     Status: None   Collection Time: 08/28/15  3:02 PM  Result Value Ref Range Status   MRSA by PCR NEGATIVE NEGATIVE Final    Comment:        The GeneXpert MRSA Assay (FDA approved for NASAL specimens only), is one component of a comprehensive MRSA colonization surveillance program. It is not intended to diagnose MRSA infection nor to guide or monitor treatment for MRSA infections.      Scheduled Meds: . allopurinol  100 mg Oral Daily  . antiseptic oral rinse  7 mL Mouth Rinse q12n4p  . calcitRIOL  0.25 mcg Oral Q M,W,F-1800  . calcium carbonate  1 tablet Oral TID WC  . cefUROXime  500 mg Oral QPC supper  . chlorhexidine  15 mL Mouth Rinse BID  . folic acid  1 mg Oral Daily  . lamoTRIgine  100 mg Oral QHS  . lamoTRIgine  50 mg Oral Daily  . latanoprost  1 drop Both Eyes QHS  . levothyroxine  75 mcg Oral QAC breakfast  . midodrine  10 mg Oral Q6H  . QUEtiapine  12.5 mg Oral QHS  . senna-docusate  1 tablet Oral BID  . sertraline  25 mg Oral Daily  . simvastatin  20 mg Oral QHS  . timolol  1 drop Both Eyes Daily     LOS: 7 days   Cherene Altes, MD [facility] Office  [phone] Pager - Text Page per Amion as per below:  On-Call/Text Page:      [email or web address]      password [email or web address]  If 7PM-7AM, please contact night-coverage [email or web address] Password [email or web address] 09/04/2015, 2:56 PM

## 2015-09-05 MED ORDER — BOOST / RESOURCE BREEZE PO LIQD
1.0000 | Freq: Three times a day (TID) | ORAL | Status: DC
  Administered 2015-09-05 – 2015-09-12 (×15): 1 via ORAL

## 2015-09-05 MED ORDER — DARBEPOETIN ALFA 60 MCG/0.3ML IJ SOSY
60.0000 ug | PREFILLED_SYRINGE | INTRAMUSCULAR | Status: DC
  Administered 2015-09-06: 60 ug via INTRAVENOUS
  Filled 2015-09-05: qty 0.3

## 2015-09-05 NOTE — Progress Notes (Signed)
[name]  XMI:[id number] DOB: May 02, 1926 DOA: 08/28/2015 PCP: Angelina Sheriff., MD    Brief Narrative:  82yo male with hx of AoS, Afib, ESRD, HTN, and Guillain-Barre who presented 7/31 with c/o weakness, malaise and a fall at home.  In the ER he had progressive hypotension despite volume and chronic R sided effusion (previously thought ?malignant) with possible infiltrate on CXR.  PCCM admitted the pt to the ICU.     Subjective: The patient is resting comfortably on bed, has no complains.  He is in no respiratory distress.    Assessment & Plan:  RLL PNA  Completed a 5 day course of empiric abx   E. coli bacteremia  Source not clear - ?E coli PNA - plan to complete a 14 day course of antibiotic therapy for this indication - transitioned to oral meds after 7 days of IV tx   Chronic bilateral pleural effusion, left more than right - had thora 02/14/2015 X 1 liter w/ glucose 106 WBC 160 with L > P, cyt POS atypical cells - has been evaluated by PCCM - signif L sided effusion noted on f/u CXR 8/5 AM, progressing since prior CXRs - clinically stable at present - follow up CXR after HD notes persistent left effusion but clinically patient stable presently - will need to follow and consider thoracentesis if hypoxia or respiratory distress becomes an issue   Hypotension - acute on chronic  Sepsis w/ bacteremia on baseline of chronic hypotension - now off pressors - midodrine continues - blood pressure stable   Chronic AFib not on anticoagulation - heart rate well controlled  ESRD on MWF HD Nephrology following - continues dialysis per usual schedule  Macrocytic anemia M25 and folic acid levels normal/high - hemoglobin stable  Hypokalemia  Corrected w/ HD   Elevated alk phos / T bil on a downward trend - no clear etiology - potentially related to chronic right pleural effusion  Thrombocytopenia  Likely due to gram-negative bacteremia - improving   Hypothyroid   Continue home  synthroid  Hx seizures  Continue home lamictal   History [name]  DVT prophylaxis: SCDs Code Status: NO INTUBATION - NO CPR - NO DEFIB Family Communication: No family present at time of exam today Disposition Plan: PT/OT to continue - appears patient will require SNF for rehabilitation - follow left pleural effusion with possible need for thoracentesis   Consultants:  Nephrology PCCM  Procedures: none  Antimicrobials:  Azithromycin 7/31 > 8/04 Cefepime 7/31 Rocephin 8/1 > 8/6 Cefuroxime 8/6 >  Objective: Blood pressure (!) 110/52, pulse 72, temperature 97.8 F (36.6 C), temperature source Axillary, resp. rate 18, height 5' 9"  (1.753 m), weight 74.1 kg (163 lb 5.8 oz), SpO2 98 %.  Intake/Output Summary (Last 24 hours) at 09/05/15 1321 Last data filed at 09/05/15 0900  Gross per 24 hour  Intake              500 ml  Output                0 ml  Net              500 ml   Filed Weights   09/04/15 0714 09/04/15 1039 09/04/15 2053  Weight: 74.3 kg (163 lb 12.8 oz) 71.1 kg (156 lb 12 oz) 74.1 kg (163 lb 5.8 oz)    Examination: General: No acute respiratory distress  Lungs: Poor air movement bilateral bases,no wheezing Cardiovascular: Regular rate without murmur  Abdomen:  Nondistended, soft, bowel sounds positive, no rebound Extremities: No significant cyanosis, clubbing, edema bilateral lower extremities  CBC:  Recent Labs Lab 08/31/15 0720 09/01/15 0500 09/02/15 0527 09/03/15 0524 09/04/15 0705  WBC 7.5 5.0 5.1 6.4 7.0  HGB 10.3* 10.4* 11.0* 11.2* 10.7*  HCT 32.8* 32.8* 34.6* 34.9* 33.1*  MCV 105.5* 103.1* 103.6* 102.9* 104.4*  PLT 70* 69* 86* 90* 87*   Basic Metabolic Panel:  Recent Labs Lab 08/30/15 0422 08/31/15 0720 09/01/15 0500 09/02/15 0527 09/03/15 0524 09/04/15 0705  NA 129* 131* 132* 133* 133* 132*  K 3.9 3.7 4.4 4.2 4.1 4.2  CL 92* 93* 92* 93* 92* 93*  CO2 24 28 27 28 27 28   GLUCOSE 163* 116* 94 121* 102* 79  BUN 41* 23* 29* 18  26* 34*  CREATININE 6.00* 3.88* 4.84* 3.32* 4.09* 4.80*  CALCIUM 8.2* 8.3* 8.5* 8.7* 9.1 9.0  MG 2.1 1.9 2.5*  --   --   --   PHOS 4.9* 3.9 4.9*  --   --  3.7   GFR: Estimated Creatinine Clearance: 11 mL/min (by C-G formula based on SCr of 4.8 mg/dL).  Liver Function Tests:  Recent Labs Lab 09/02/15 0527 09/03/15 0524 09/04/15 0705  AST 252* 176*  --   ALT 99* 98*  --   ALKPHOS 487* 403*  --   BILITOT 2.3* 1.7*  --   PROT 5.8* 6.0*  --   ALBUMIN 2.7* 2.7* 2.5*    Cardiac Enzymes: No results for input(s): CKTOTAL, CKMB, CKMBINDEX, TROPONINI in the last 168 hours.  Recent Results (from the past 240 hour(s))  Blood culture (routine x 2)     Status: None   Collection Time: 08/28/15  8:27 AM  Result Value Ref Range Status   Specimen Description BLOOD RIGHT ANTECUBITAL  Final   Special Requests BOTTLES DRAWN AEROBIC AND ANAEROBIC  5CC  Final   Culture NO GROWTH 5 DAYS  Final   Report Status 09/02/2015 FINAL  Final  Blood culture (routine x 2)     Status: Abnormal   Collection Time: 08/28/15  8:30 AM  Result Value Ref Range Status   Specimen Description BLOOD RIGHT HAND  Final   Special Requests BOTTLES DRAWN AEROBIC AND ANAEROBIC  5CC  Final   Culture  Setup Time   Final    GRAM NEGATIVE RODS IN BOTH AEROBIC AND ANAEROBIC BOTTLES CRITICAL RESULT CALLED TO, READ BACK BY AND VERIFIED WITHAlona Bene PHARMD 0305 08/29/15 A BROWNING    Culture ESCHERICHIA COLI (A)  Final   Report Status 09/01/2015 FINAL  Final   Organism ID, Bacteria ESCHERICHIA COLI  Final      Susceptibility   Escherichia coli - MIC*    AMPICILLIN >=32 RESISTANT Resistant     CEFAZOLIN <=4 SENSITIVE Sensitive     CEFEPIME <=1 SENSITIVE Sensitive     CEFTAZIDIME <=1 SENSITIVE Sensitive     CEFTRIAXONE <=1 SENSITIVE Sensitive     CIPROFLOXACIN <=0.25 SENSITIVE Sensitive     GENTAMICIN <=1 SENSITIVE Sensitive     IMIPENEM <=0.25 SENSITIVE Sensitive     TRIMETH/SULFA <=20 SENSITIVE Sensitive      AMPICILLIN/SULBACTAM 16 INTERMEDIATE Intermediate     PIP/TAZO <=4 SENSITIVE Sensitive     Extended ESBL NEGATIVE Sensitive     * ESCHERICHIA COLI  Blood Culture ID Panel (Reflexed)     Status: Abnormal   Collection Time: 08/28/15  8:30 AM  Result Value Ref Range Status   Enterococcus species NOT DETECTED  NOT DETECTED Final   Vancomycin resistance NOT DETECTED NOT DETECTED Final   Listeria monocytogenes NOT DETECTED NOT DETECTED Final   Staphylococcus species NOT DETECTED NOT DETECTED Final   Staphylococcus aureus NOT DETECTED NOT DETECTED Final   Methicillin resistance NOT DETECTED NOT DETECTED Final   Streptococcus species NOT DETECTED NOT DETECTED Final   Streptococcus agalactiae NOT DETECTED NOT DETECTED Final   Streptococcus pneumoniae NOT DETECTED NOT DETECTED Final   Streptococcus pyogenes NOT DETECTED NOT DETECTED Final   Acinetobacter baumannii NOT DETECTED NOT DETECTED Final   Enterobacteriaceae species DETECTED (A) NOT DETECTED Final    Comment: CRITICAL RESULT CALLED TO, READ BACK BY AND VERIFIED WITH: V BRYK PHARMD 0305 08/29/15 A BROWNING    Enterobacter cloacae complex NOT DETECTED NOT DETECTED Final   Escherichia coli DETECTED (A) NOT DETECTED Final    Comment: CRITICAL RESULT CALLED TO, READ BACK BY AND VERIFIED WITHAlona Bene PHARMD 0305 08/29/15 A BROWNING    Klebsiella oxytoca NOT DETECTED NOT DETECTED Final   Klebsiella pneumoniae NOT DETECTED NOT DETECTED Final   Proteus species NOT DETECTED NOT DETECTED Final   Serratia marcescens NOT DETECTED NOT DETECTED Final   Carbapenem resistance NOT DETECTED NOT DETECTED Final   Haemophilus influenzae NOT DETECTED NOT DETECTED Final   Neisseria meningitidis NOT DETECTED NOT DETECTED Final   Pseudomonas aeruginosa NOT DETECTED NOT DETECTED Final   Candida albicans NOT DETECTED NOT DETECTED Final   Candida glabrata NOT DETECTED NOT DETECTED Final   Candida krusei NOT DETECTED NOT DETECTED Final   Candida parapsilosis NOT  DETECTED NOT DETECTED Final   Candida tropicalis NOT DETECTED NOT DETECTED Final  MRSA PCR Screening     Status: None   Collection Time: 08/28/15  3:02 PM  Result Value Ref Range Status   MRSA by PCR NEGATIVE NEGATIVE Final    Comment:        The GeneXpert MRSA Assay (FDA approved for NASAL specimens only), is one component of a comprehensive MRSA colonization surveillance program. It is not intended to diagnose MRSA infection nor to guide or monitor treatment for MRSA infections.      Scheduled Meds: . allopurinol  100 mg Oral Daily  . antiseptic oral rinse  7 mL Mouth Rinse q12n4p  . calcium carbonate  1 tablet Oral TID WC  . cefUROXime  500 mg Oral QPC supper  . chlorhexidine  15 mL Mouth Rinse BID  . [START ON 09/06/2015] darbepoetin (ARANESP) injection - DIALYSIS  60 mcg Intravenous Q Wed-HD  . feeding supplement  1 Container Oral TID WC  . folic acid  1 mg Oral Daily  . lamoTRIgine  100 mg Oral QHS  . lamoTRIgine  50 mg Oral Daily  . latanoprost  1 drop Both Eyes QHS  . levothyroxine  75 mcg Oral QAC breakfast  . midodrine  10 mg Oral Q6H  . QUEtiapine  12.5 mg Oral QHS  . senna-docusate  1 tablet Oral BID  . sertraline  25 mg Oral Daily  . simvastatin  20 mg Oral QHS  . timolol  1 drop Both Eyes Daily     LOS: 8 days   Phillips Climes, MD [facility] Office  [phone] Pager - Text Page per Amion as per below:  On-Call/Text Page:      [email or web address] twice a day      password [email or web address] Right result: If 7PM-7AM, please contact night-coverage [email or web address] Password [email or web address] 09/05/2015, 1:21 PM

## 2015-09-05 NOTE — Progress Notes (Signed)
Physical Therapy Treatment Patient Details Name: [name] MRN: [id number] DOB: [birth year] Today's Date: 80/08/2015    History of Present Illness 82yo male with hx of Afib, ESRD on HD MWF, HTN, andGuillain-Barre who presented 7/31 with c/o weakness, malaise and a fall at home. progressive hypotension, chronic R sided effusion (previously thought ?malignant) with possibleinfiltrate on CXR. +RLL pna, +ecoli bacteremia. In ICU on pressors 7/31-08/03/17    PT Comments    Pt making slow, steady progress. CIR denied. Recommend SNF for further therapy. Requiring 2 person assist for all mobility.  Follow Up Recommendations  SNF     Equipment Recommendations  Other (comment) (TBA)    Recommendations for Other Services OT consult     Precautions / Restrictions Precautions Precautions: Fall    Mobility  Bed Mobility Overal bed mobility: Needs Assistance Bed Mobility: Supine to Sit     Supine to sit: Max assist;HOB elevated     General bed mobility comments: Assist to bring legs off bed, elevate trunk into sitting, and bring hips to EOB.  Transfers Overall transfer level: Needs assistance Equipment used: Ambulation equipment used Transfers: Sit to/from [facility] Sit to Stand: Max assist;+2 physical assistance;From elevated surface Stand pivot transfers: Max assist;+2 physical assistance       General transfer comment: Heavy assist to bring hips up. Once able to get up into standing pt able to maintain with min A. Used Denna Haggard for pivot to chair.  Ambulation/Gait                 Stairs            Wheelchair Mobility    Modified Rankin (Stroke Patients Only)       Balance Overall balance assessment: Needs assistance Sitting-balance support: Bilateral upper extremity supported;Feet supported Sitting balance-Leahy Scale: Poor Sitting balance - Comments: Initially min A but then able to maintain with supervision and feet on  ground Postural control: Posterior lean Standing balance support: Bilateral upper extremity supported Standing balance-Leahy Scale: Poor Standing balance comment: Static standing with Denna Haggard and min A. Stood x 2 for 30 - 60 sec                    Cognition Arousal/Alertness: Awake/alert Behavior During Therapy: WFL for tasks assessed/performed Overall Cognitive Status: No family/caregiver present to determine baseline cognitive functioning Area of Impairment: Memory;Problem solving     Memory: Decreased short-term memory       Problem Solving: Slow processing;Difficulty sequencing;Requires verbal cues;Requires tactile cues      Exercises      General Comments        Pertinent Vitals/Pain Pain Assessment: Faces Faces Pain Scale: Hurts even more Pain Location: rt knee Pain Descriptors / Indicators: Grimacing;Guarding Pain Intervention(s): Limited activity within patient's tolerance;Monitored during session;Repositioned    Home Living                      Prior Function            PT Goals (current goals can now be found in the care plan section) Progress towards PT goals: Progressing toward goals    Frequency  Min 3X/week    PT Plan Discharge plan needs to be updated    Co-evaluation             End of Session Equipment Utilized During Treatment: Gait belt Activity Tolerance: Patient limited by fatigue Patient left: with call bell/phone within reach;in chair;with  chair alarm set     Time: [id number] PT Time Calculation (min) (ACUTE ONLY): 18 min  Charges:  $Therapeutic Activity: 8-22 mins                    G Codes:      Mumtaz Lovins Sep 12, 2015, 12:16 PM [employer] PT [phone]

## 2015-09-05 NOTE — Care Management Note (Signed)
Case Management Note  Patient Details  Name: KASHIS VANA MRN: [id number] Date of Birth: [birth year]  Subjective/Objective:      CM following progression and d/c planning.               Action/Plan: 09/05/2015 pt discussed in [facility], re mobility and ability to travel to SNF.  This CM attempting to see wife, however apparently missed her during her visit to am. Spoke with pt son, who states that he will given his mother my number to discuss d/c plan. Son unaware that transportation may be an issue for his father to get to dialysis. This pt required 2+ assist to get to a chair, at this time would be unable to transfer to dialysis recliner and ability to travel to and from HD is questionable. At this time this pt has not sat up for a full HD treatment. This CM spoke with pt RN and staff re need to increase activity.   Expected Discharge Date:                 Expected Discharge Plan:  Skilled Nursing Facility  In-House Referral:  Clinical Social Work  Discharge planning Services  CM Consult  Post Acute Care Choice:    Choice offered to:     DME Arranged:    DME Agency:     HH Arranged:    [place] Agency:     Status of Service:  In process, will continue to follow  If discussed at Long Length of Stay Meetings, dates discussed:    Additional Comments:  Adron Bene, RN 09/05/2015, 2:02 PM

## 2015-09-05 NOTE — Progress Notes (Signed)
Rehab admissions - Please see rehab consult done by Dr. Posey Pronto recommending SNF placement due to lack of social supports.  Call me for questions.  [phone]

## 2015-09-05 NOTE — Progress Notes (Signed)
Subjective:  Tolerated 3 l uf hd yest. , only co is weakness this am   Objective Vital signs in last 24 hours: Vitals:   09/04/15 1100 09/04/15 1818 09/04/15 2053 09/05/15 0512  BP: 135/75 116/70 139/64 114/61  Pulse: 70 77 67 67  Resp: 16 18 16 16   Temp: 97.5 F (36.4 C) 98.2 F (36.8 C) 97.5 F (36.4 C) 97.9 F (36.6 C)  TempSrc: Oral Axillary Oral Axillary  SpO2: 95% 97% 97% 100%  Weight:   74.1 kg (163 lb 5.8 oz)   Height:       Weight change: -2.721 kg (-6 lb)  Physical Exam: General: elderly  Thin chronically il WM alert - and pleasantly  confused  To day only. Heart: RRR, no rub or mur  Lungs: poor effort and decr bs / nonlabored breathing   / 100%  Rm Air o2 sat Abdomen: soft, non tender, non distended  Extremities: trace bipedal  pitting  edema-  Dialysis Access: left AVF- with  Aneurysmal areas  but does not look infected     OP Dialysis: [facility] MWF  3h 67min  64kg  2/2.25 bath  Heparin none  L arm AVF  Venofer 50 weekly, mircera 75 q 2 weeks- given 7/19  Last hgb 10/4 On TUMS for binding and calcitriol 0.25- last ca=9.7, phos 3.6, PTH 77    Summary: 82 year old WM with multiple medical problems including ESRD presenting with weakness and found to have hypotension beyond normal hypotension for him  Assessment: 1 Hypotension- related to [name] sepsis/ bacteremia.  Poss from PNA.   BPS Improved somewhat now on Midodrine 10 mg q 6 hrs  2 ESRD:MWF [facility] / wt post hd 71.1 still up 6- 7 kg by BED wts / cxr pre hd did shoe some pulm edema / large L pl effuion  And r pl effusion / Need standing wt in am with HD attempt  Another 3 l uf  3 Hyponatremia - due to vol excess, improving with uf on hd  4 Anemia of ESRD: hgb 10.7 yest /on mircera and venofer as OP-  due ESA this week(ordered)   5 Metabolic Bone Disease: normally on calcitriol and tums- / corec  Ca 10.2  Phos 3.7 n Will hold calcitriol  An duse 2.0  Ca bath  6 Dispo- quite debilitated, unsure disposition( lived  with wife at home  7 AMS - slowly improving  8.Nutrion - alb 2.5  Add Protein supplement to dys 3 diet / renal vit .  Ernest Haber, PA-C [facility] Beeper [phone] 09/05/2015,8:38 AM  LOS: 8 days   Pt seen, examined and agree w A/P as above.  Kelly Splinter MD [facility] pager [phone]    cell [phone] 09/05/2015, 1:20 PM    Labs: Basic Metabolic Panel:  Recent Labs Lab 08/31/15 0720 09/01/15 0500 09/02/15 0527 09/03/15 0524 09/04/15 0705  NA 131* 132* 133* 133* 132*  K 3.7 4.4 4.2 4.1 4.2  CL 93* 92* 93* 92* 93*  CO2 28 27 28 27 28   GLUCOSE 116* 94 121* 102* 79  BUN 23* 29* 18 26* 34*  CREATININE 3.88* 4.84* 3.32* 4.09* 4.80*  CALCIUM 8.3* 8.5* 8.7* 9.1 9.0  PHOS 3.9 4.9*  --   --  3.7   Liver Function Tests:  Recent Labs Lab 08/29/15 0930 09/02/15 0527 09/03/15 0524 09/04/15 0705  AST 97* 252* 176*  --   ALT 26 99* 98*  --   ALKPHOS  249* 487* 403*  --   BILITOT 2.6* 2.3* 1.7*  --   PROT 5.4* 5.8* 6.0*  --   ALBUMIN 2.5* 2.7* 2.7* 2.5*    CBC:  Recent Labs Lab 08/31/15 0720 09/01/15 0500 09/02/15 0527 09/03/15 0524 09/04/15 0705  WBC 7.5 5.0 5.1 6.4 7.0  HGB 10.3* 10.4* 11.0* 11.2* 10.7*  HCT 32.8* 32.8* 34.6* 34.9* 33.1*  MCV 105.5* 103.1* 103.6* 102.9* 104.4*  PLT 70* 69* 86* 90* 87*    Studies/Results: Dg Chest Port 1 View  Result Date: 09/04/2015 CLINICAL DATA:  Pleural effusions. Short of breath. Subsequent encounter. EXAM: PORTABLE CHEST 1 VIEW COMPARISON:  09/02/2015 FINDINGS: Large left and moderate right pleural effusions upper similar to the prior exam. There is vascular congestion and interstitial thickening most evident centrally and in the lung bases. Cardiac silhouette is mostly obscured.  No pneumothorax. IMPRESSION: 1. Pulmonary edema with large left and moderate right pleural effusions. Additional lung base opacity most evident on the left. This is likely atelectasis. Pneumonia is possible. 2.  Status post removal of the right internal jugular central venous line. No other significant change from the prior study. Electronically Signed   By: Lajean Manes M.D.   On: 09/04/2015 08:20   Medications:   . allopurinol  100 mg Oral Daily  . antiseptic oral rinse  7 mL Mouth Rinse q12n4p  . calcitRIOL  0.25 mcg Oral Q M,W,F-1800  . calcium carbonate  1 tablet Oral TID WC  . cefUROXime  500 mg Oral QPC supper  . chlorhexidine  15 mL Mouth Rinse BID  . folic acid  1 mg Oral Daily  . lamoTRIgine  100 mg Oral QHS  . lamoTRIgine  50 mg Oral Daily  . latanoprost  1 drop Both Eyes QHS  . levothyroxine  75 mcg Oral QAC breakfast  . midodrine  10 mg Oral Q6H  . QUEtiapine  12.5 mg Oral QHS  . senna-docusate  1 tablet Oral BID  . sertraline  25 mg Oral Daily  . simvastatin  20 mg Oral QHS  . timolol  1 drop Both Eyes Daily

## 2015-09-06 LAB — BASIC METABOLIC PANEL
ANION GAP: 12 (ref 5–15)
BUN: 36 mg/dL — AB (ref 6–20)
CO2: 26 mmol/L (ref 22–32)
Calcium: 8.7 mg/dL — ABNORMAL LOW (ref 8.9–10.3)
Chloride: 95 mmol/L — ABNORMAL LOW (ref 101–111)
Creatinine, Ser: 4.8 mg/dL — ABNORMAL HIGH (ref 0.61–1.24)
GFR, EST AFRICAN AMERICAN: 11 mL/min — AB (ref >60)
GFR, EST NON AFRICAN AMERICAN: 10 mL/min — AB (ref >60)
Glucose, Bld: 85 mg/dL (ref 65–99)
POTASSIUM: 4.6 mmol/L (ref 3.5–5.1)
SODIUM: 133 mmol/L — AB (ref 135–145)

## 2015-09-06 LAB — CBC
HCT: 34.1 % — ABNORMAL LOW (ref 39.0–52.0)
HEMOGLOBIN: 10.9 g/dL — AB (ref 13.0–17.0)
MCH: 33.6 pg (ref 26.0–34.0)
MCHC: 32 g/dL (ref 30.0–36.0)
MCV: 105.2 fL — ABNORMAL HIGH (ref 78.0–100.0)
PLATELETS: 110 10*3/uL — AB (ref 150–400)
RBC: 3.24 MIL/uL — AB (ref 4.22–5.81)
RDW: 17.3 % — ABNORMAL HIGH (ref 11.5–15.5)
WBC: 7.5 10*3/uL (ref 4.0–10.5)

## 2015-09-06 MED ORDER — DARBEPOETIN ALFA 60 MCG/0.3ML IJ SOSY
PREFILLED_SYRINGE | INTRAMUSCULAR | Status: AC
  Filled 2015-09-06: qty 0.3

## 2015-09-06 NOTE — Progress Notes (Signed)
VICTORIA EUCEDA  HKV:[id number] DOB: 01/21/1927 DOA: 08/28/2015 PCP: Angelina Sheriff., MD    Brief Narrative:  82yo male with hx of AoS, Afib, ESRD, HTN, and Guillain-Barre who presented 7/31 with c/o weakness, malaise and a fall at home.  In the ER he had progressive hypotension despite volume and chronic R sided effusion (previously thought ?malignant) with possible infiltrate on CXR.  PCCM admitted the pt to the ICU., Workup was significant for Escherichia coli bacteremia, and right lower lobe pneumonia which was treated, patient remained with significant weakness and debilitation, plan to discharge to SNF when he can tolerate hemodialysis in a chair.   Subjective: The patient is resting comfortably on bed, has no complains.  He is in no respiratory distress.    Assessment & Plan:  RLL PNA  Completed a 5 day course of empiric abx   E. coli bacteremia  Source not clear - ?E coli PNA - plan to complete a 14 day course of antibiotic therapy for this indication - transitioned to oral Ceftin, to finish total of 14 days.  Chronic bilateral pleural effusion, left more than right - had thora 02/14/2015 X 1 liter w/ glucose 106 WBC 160 with L > P, cyt POS atypical cells - has been evaluated by PCCM - signif L sided effusion noted on f/u CXR 8/5 AM, progressing since prior CXRs - clinically stable at present , no respiratory distress or hypoxia, can be followed as an outpatient with Dr. Shyrl Numbers.  Hypotension - acute on chronic  Sepsis w/ bacteremia on baseline of chronic hypotension - now off pressors - midodrine continues - blood pressure stable   Chronic AFib not on anticoagulation - heart rate well controlled  ESRD on MWF HD Nephrology following - continues dialysis per usual schedule  Macrocytic anemia F64 and folic acid levels normal/high - hemoglobin stable  Hypokalemia  Corrected w/ HD   Elevated alk phos / T bil on a downward trend - no clear etiology - potentially related to  chronic right pleural effusion  Thrombocytopenia  Likely due to gram-negative bacteremia - improving   Hypothyroid   Continue home synthroid  Hx seizures  Continue home lamictal   History [name]  DVT prophylaxis: SCDs Code Status: NO INTUBATION - NO CPR - NO DEFIB Family Communication: No family present at time of exam today Disposition Plan: We'll need SNF placement, but has tolerate hemodialysis while sitting prior to discharge  Consultants:  Nephrology PCCM  Procedures: none  Antimicrobials:  Azithromycin 7/31 > 8/04 Cefepime 7/31 Rocephin 8/1 > 8/6 Cefuroxime 8/6 >  Objective: Blood pressure 124/62, pulse 70, temperature 97.7 F (36.5 C), temperature source Oral, resp. rate 16, height 5' 7"  (1.702 m), weight 64.8 kg (142 lb 13.7 oz), SpO2 97 %.  Intake/Output Summary (Last 24 hours) at 09/06/15 1455 Last data filed at 09/06/15 1045  Gross per 24 hour  Intake              120 ml  Output             3000 ml  Net            -2880 ml   Filed Weights   09/06/15 0500 09/06/15 0718 09/06/15 1045  Weight: 75.3 kg (166 lb 0.1 oz) 67.8 kg (149 lb 7.6 oz) 64.8 kg (142 lb 13.7 oz)    Examination: General: No acute respiratory distress  Lungs: Poor air movement bilateral bases,no wheezing Cardiovascular: Regular rate without murmur  Abdomen:  Nondistended, soft, bowel sounds positive, no rebound Extremities: No significant cyanosis, clubbing, edema bilateral lower extremities  CBC:  Recent Labs Lab 09/01/15 0500 09/02/15 0527 09/03/15 0524 09/04/15 0705 09/06/15 0800  WBC 5.0 5.1 6.4 7.0 7.5  HGB 10.4* 11.0* 11.2* 10.7* 10.9*  HCT 32.8* 34.6* 34.9* 33.1* 34.1*  MCV 103.1* 103.6* 102.9* 104.4* 105.2*  PLT 69* 86* 90* 87* 371*   Basic Metabolic Panel:  Recent Labs Lab 08/31/15 0720 09/01/15 0500 09/02/15 0527 09/03/15 0524 09/04/15 0705 09/06/15 0800  NA 131* 132* 133* 133* 132* 133*  K 3.7 4.4 4.2 4.1 4.2 4.6  CL 93* 92* 93* 92* 93* 95*    CO2 28 27 28 27 28 26   GLUCOSE 116* 94 121* 102* 79 85  BUN 23* 29* 18 26* 34* 36*  CREATININE 3.88* 4.84* 3.32* 4.09* 4.80* 4.80*  CALCIUM 8.3* 8.5* 8.7* 9.1 9.0 8.7*  MG 1.9 2.5*  --   --   --   --   PHOS 3.9 4.9*  --   --  3.7  --    GFR: Estimated Creatinine Clearance: 10.1 mL/min (by C-G formula based on SCr of 4.8 mg/dL).  Liver Function Tests:  Recent Labs Lab 09/02/15 0527 09/03/15 0524 09/04/15 0705  AST 252* 176*  --   ALT 99* 98*  --   ALKPHOS 487* 403*  --   BILITOT 2.3* 1.7*  --   PROT 5.8* 6.0*  --   ALBUMIN 2.7* 2.7* 2.5*    Cardiac Enzymes: No results for input(s): CKTOTAL, CKMB, CKMBINDEX, TROPONINI in the last 168 hours.  Recent Results (from the past 240 hour(s))  Blood culture (routine x 2)     Status: None   Collection Time: 08/28/15  8:27 AM  Result Value Ref Range Status   Specimen Description BLOOD RIGHT ANTECUBITAL  Final   Special Requests BOTTLES DRAWN AEROBIC AND ANAEROBIC  5CC  Final   Culture NO GROWTH 5 DAYS  Final   Report Status 09/02/2015 FINAL  Final  Blood culture (routine x 2)     Status: Abnormal   Collection Time: 08/28/15  8:30 AM  Result Value Ref Range Status   Specimen Description BLOOD RIGHT HAND  Final   Special Requests BOTTLES DRAWN AEROBIC AND ANAEROBIC  5CC  Final   Culture  Setup Time   Final    GRAM NEGATIVE RODS IN BOTH AEROBIC AND ANAEROBIC BOTTLES CRITICAL RESULT CALLED TO, READ BACK BY AND VERIFIED WITHAlona Bene PHARMD 0305 08/29/15 A BROWNING    Culture ESCHERICHIA COLI (A)  Final   Report Status 09/01/2015 FINAL  Final   Organism ID, Bacteria ESCHERICHIA COLI  Final      Susceptibility   Escherichia coli - MIC*    AMPICILLIN >=32 RESISTANT Resistant     CEFAZOLIN <=4 SENSITIVE Sensitive     CEFEPIME <=1 SENSITIVE Sensitive     CEFTAZIDIME <=1 SENSITIVE Sensitive     CEFTRIAXONE <=1 SENSITIVE Sensitive     CIPROFLOXACIN <=0.25 SENSITIVE Sensitive     GENTAMICIN <=1 SENSITIVE Sensitive     IMIPENEM <=0.25  SENSITIVE Sensitive     TRIMETH/SULFA <=20 SENSITIVE Sensitive     AMPICILLIN/SULBACTAM 16 INTERMEDIATE Intermediate     PIP/TAZO <=4 SENSITIVE Sensitive     Extended ESBL NEGATIVE Sensitive     * ESCHERICHIA COLI  Blood Culture ID Panel (Reflexed)     Status: Abnormal   Collection Time: 08/28/15  8:30 AM  Result Value Ref Range Status  Enterococcus species NOT DETECTED NOT DETECTED Final   Vancomycin resistance NOT DETECTED NOT DETECTED Final   Listeria monocytogenes NOT DETECTED NOT DETECTED Final   Staphylococcus species NOT DETECTED NOT DETECTED Final   Staphylococcus aureus NOT DETECTED NOT DETECTED Final   Methicillin resistance NOT DETECTED NOT DETECTED Final   Streptococcus species NOT DETECTED NOT DETECTED Final   Streptococcus agalactiae NOT DETECTED NOT DETECTED Final   Streptococcus pneumoniae NOT DETECTED NOT DETECTED Final   Streptococcus pyogenes NOT DETECTED NOT DETECTED Final   Acinetobacter baumannii NOT DETECTED NOT DETECTED Final   Enterobacteriaceae species DETECTED (A) NOT DETECTED Final    Comment: CRITICAL RESULT CALLED TO, READ BACK BY AND VERIFIED WITH: V BRYK PHARMD 0305 08/29/15 A BROWNING    Enterobacter cloacae complex NOT DETECTED NOT DETECTED Final   Escherichia coli DETECTED (A) NOT DETECTED Final    Comment: CRITICAL RESULT CALLED TO, READ BACK BY AND VERIFIED WITHAlona Bene PHARMD 0305 08/29/15 A BROWNING    Klebsiella oxytoca NOT DETECTED NOT DETECTED Final   Klebsiella pneumoniae NOT DETECTED NOT DETECTED Final   Proteus species NOT DETECTED NOT DETECTED Final   Serratia marcescens NOT DETECTED NOT DETECTED Final   Carbapenem resistance NOT DETECTED NOT DETECTED Final   Haemophilus influenzae NOT DETECTED NOT DETECTED Final   Neisseria meningitidis NOT DETECTED NOT DETECTED Final   Pseudomonas aeruginosa NOT DETECTED NOT DETECTED Final   Candida albicans NOT DETECTED NOT DETECTED Final   Candida glabrata NOT DETECTED NOT DETECTED Final   Candida  krusei NOT DETECTED NOT DETECTED Final   Candida parapsilosis NOT DETECTED NOT DETECTED Final   Candida tropicalis NOT DETECTED NOT DETECTED Final  MRSA PCR Screening     Status: None   Collection Time: 08/28/15  3:02 PM  Result Value Ref Range Status   MRSA by PCR NEGATIVE NEGATIVE Final    Comment:        The GeneXpert MRSA Assay (FDA approved for NASAL specimens only), is one component of a comprehensive MRSA colonization surveillance program. It is not intended to diagnose MRSA infection nor to guide or monitor treatment for MRSA infections.      Scheduled Meds: . allopurinol  100 mg Oral Daily  . antiseptic oral rinse  7 mL Mouth Rinse q12n4p  . calcium carbonate  1 tablet Oral TID WC  . cefUROXime  500 mg Oral QPC supper  . chlorhexidine  15 mL Mouth Rinse BID  . Darbepoetin Alfa      . darbepoetin (ARANESP) injection - DIALYSIS  60 mcg Intravenous Q Wed-HD  . feeding supplement  1 Container Oral TID WC  . folic acid  1 mg Oral Daily  . lamoTRIgine  100 mg Oral QHS  . lamoTRIgine  50 mg Oral Daily  . latanoprost  1 drop Both Eyes QHS  . levothyroxine  75 mcg Oral QAC breakfast  . midodrine  10 mg Oral Q6H  . QUEtiapine  12.5 mg Oral QHS  . senna-docusate  1 tablet Oral BID  . sertraline  25 mg Oral Daily  . simvastatin  20 mg Oral QHS  . timolol  1 drop Both Eyes Daily     LOS: 9 days   Phillips Climes, MD [facility] Office  [phone] Pager - Text Page per Amion as per below:  On-Call/Text Page:      [email or web address] twice a day      password [email or web address] Right result: If 7PM-7AM, please contact night-coverage [email or web address] Password [email or web address] 09/06/2015,  2:55 PM

## 2015-09-06 NOTE — NC FL2 (Signed)
[place] LEVEL OF CARE SCREENING TOOL     IDENTIFICATION  Patient Name: [name] Birthdate: [birth year] Sex: male Admission Date (Current Location): 08/28/2015  [facility] and [place] Number:  Herbalist and Address:  [facility] [place] [address], [place], [place] [address]      Provider Number: [id number]  Attending Physician Name and Address:  Albertine Patricia, MD  Relative Name and Phone Number:       Current Level of Care:   Recommended Level of Care:   Prior Approval Number:    Date Approved/Denied:   PASRR Number: [id number] (Eff. 02/18/06)  Discharge Plan: SNF    Current Diagnoses: Patient Active Problem List   Diagnosis Date Noted  . Bacteremia   . PAF (paroxysmal atrial fibrillation) ([place])   . Dementia   . Macrocytic anemia   . Thrombocytopenia ([place])   . Debility   . Chronic renal failure   . Encounter for central line placement   . Healthcare-associated pneumonia   . Hypotension 08/28/2015  . Dyspnea 02/21/2015  . Pleural effusion 02/14/2015  . Poor dentition 01/18/2015  . Pleural effusion on left 01/17/2015  . Hypotension, unspecified 04/02/2013  . Fever, unspecified 04/02/2013  . Acute respiratory failure ([place]) 04/02/2013  . CAP (community acquired pneumonia) 04/02/2013  . Seizures ([place]) 04/02/2013  . Sepsis ([place]) 04/02/2013  . Encounter for long-term (current) use of other medications   . Hereditary and idiopathic peripheral neuropathy   . Unspecified transient cerebral ischemia   . Polyneuropathy in other diseases classified elsewhere ([place])   . External hemorrhoids 10/30/2010  . HYPOTHYROIDISM 08/14/2009  . HYPERPARATHYROIDISM, SECONDARY 08/14/2009  . HYPERLIPIDEMIA 08/14/2009  . GOUT, UNSPECIFIED 08/14/2009  . ANEMIA OF CHRONIC DISEASE 08/14/2009  . HYPERTENSION 08/14/2009  . Aortic valve disorder 08/14/2009  . HEMORRHOIDS, INTERNAL 08/14/2009  . HIATAL HERNIA 08/14/2009  .  DIVERTICULOSIS, COLON 08/14/2009  . PROCTITIS 08/14/2009  . ESRD on dialysis ([place]) 08/14/2009  . COLONIC POLYPS, ADENOMATOUS, HX OF 08/14/2009  . BENIGN PROSTATIC HYPERTROPHY, HX OF 08/14/2009    Orientation RESPIRATION BLADDER Height & Weight     Self, Time, Situation, Place  Normal Continent Weight: 142 lb 13.7 oz (64.8 kg) Height:  5\' 7"  (170.2 cm)  BEHAVIORAL SYMPTOMS/MOOD NEUROLOGICAL BOWEL NUTRITION STATUS    Convulsions/Seizures (History of seizures) Continent Diet (Dysphagia 3 - mechanical soft/thin liquid)  AMBULATORY STATUS COMMUNICATION OF NEEDS Skin   Total Care (Patient unable to ambulate with PT ) Verbally Other (Comment) (Patient has skin tear and ecchymosis on body)                       Personal Care Assistance Level of Assistance  Bathing, Feeding, Dressing Bathing Assistance: Limited assistance Feeding assistance: Independent Dressing Assistance: Limited assistance     Functional Limitations Info  Sight, Hearing, Speech Sight Info: Adequate Hearing Info: Impaired Speech Info: Adequate    SPECIAL CARE FACTORS FREQUENCY  PT (By licensed PT), Speech therapy     PT Frequency: Evaluated 8/4 and a minimum of 3X per week therapy recommended       Speech Therapy Frequency: Evaluated 7/31      Contractures Contractures Info: Not present    Additional Factors Info  Code Status, Allergies Code Status Info: Partial code Allergies Info: No known allergies           Current Medications (09/06/2015):  This is the current hospital active medication list Current  Facility-Administered Medications  Medication Dose Route Frequency Provider Last Rate Last Dose  . allopurinol (ZYLOPRIM) tablet 100 mg  100 mg Oral Daily Cherene Altes, MD   100 mg at 09/06/15 1218  . antiseptic oral rinse (CPC / CETYLPYRIDINIUM CHLORIDE 0.05%) solution 7 mL  7 mL Mouth Rinse q12n4p Praveen Mannam, MD   7 mL at 09/06/15 1600  . calcium carbonate (TUMS - dosed in mg elemental  calcium) chewable tablet 200 mg of elemental calcium  1 tablet Oral TID WC Corliss Parish, MD   200 mg of elemental calcium at 09/06/15 1740  . cefUROXime (CEFTIN) tablet 500 mg  500 mg Oral QPC supper Cherene Altes, MD   500 mg at 09/06/15 1740  . chlorhexidine (PERIDEX) 0.12 % solution 15 mL  15 mL Mouth Rinse BID Praveen Mannam, MD   15 mL at 09/06/15 1218  . Darbepoetin Alfa (ARANESP) 60 MCG/0.3ML injection           . Darbepoetin Alfa (ARANESP) injection 60 mcg  60 mcg Intravenous Q Wed-HD Ernest Haber, PA-C   60 mcg at 09/06/15 [phone]  . feeding supplement (BOOST / RESOURCE BREEZE) liquid 1 Container  1 Container Oral TID WC Ernest Haber, PA-C   1 Container at 09/06/15 1700  . folic acid (FOLVITE) tablet 1 mg  1 mg Oral Daily Cherene Altes, MD   1 mg at 09/06/15 1218  . lamoTRIgine (LAMICTAL) tablet 100 mg  100 mg Oral QHS Marijean Heath, NP   100 mg at 09/05/15 2128  . lamoTRIgine (LAMICTAL) tablet 50 mg  50 mg Oral Daily Marijean Heath, NP   50 mg at 09/06/15 1219  . latanoprost (XALATAN) 0.005 % ophthalmic solution 1 drop  1 drop Both Eyes QHS Cherene Altes, MD   1 drop at 09/05/15 2129  . levothyroxine (SYNTHROID, LEVOTHROID) tablet 75 mcg  75 mcg Oral QAC breakfast Marijean Heath, NP   75 mcg at 09/06/15 1218  . midodrine (PROAMATINE) tablet 10 mg  10 mg Oral Q6H Rush Farmer, MD   10 mg at 09/06/15 1739  . ondansetron (ZOFRAN) injection 4 mg  4 mg Intravenous Q6H PRN Marijean Heath, NP   4 mg at 08/30/15 1700  . QUEtiapine (SEROQUEL) tablet 12.5 mg  12.5 mg Oral QHS Cherene Altes, MD   12.5 mg at 09/05/15 2126  . senna-docusate (Senokot-S) tablet 1 tablet  1 tablet Oral BID Cherene Altes, MD   1 tablet at 09/06/15 1217  . sertraline (ZOLOFT) tablet 25 mg  25 mg Oral Daily Marijean Heath, NP   25 mg at 09/06/15 1217  . simvastatin (ZOCOR) tablet 20 mg  20 mg Oral QHS Cherene Altes, MD   20 mg at 09/05/15 2126  . timolol  (TIMOPTIC) 0.5 % ophthalmic solution 1 drop  1 drop Both Eyes Daily Cherene Altes, MD   1 drop at 09/06/15 1219     Discharge Medications: Please see discharge summary for a list of discharge medications.  Relevant Imaging Results:  Relevant Lab Results:   Additional Information ss#[id number].  Dialysis Patient MWF at [facility] dialysis center  [place], Mila Homer, LCSW

## 2015-09-06 NOTE — Procedures (Signed)
Remains frail and weak.  On HD.  Will need to do next HD in a chair to see if he can tolerate. Get vol down to dry wt today hopefully.    [facility] MWF  3h 61min  64kg  2/2.25 bath  Heparin none  L arm AVF   I was present at this dialysis session, have reviewed the session itself and made  appropriate changes Kelly Splinter MD [place] pager [phone]    cell [phone] 09/06/2015, 9:47 AM

## 2015-09-06 NOTE — Clinical Social Work Note (Signed)
SNF for ST rehab recommended by PT. Nurse case manager, [employer] received call from [facility] today as wife visited facility and is interested in patient coming to facility once medically stable.  Bed search initiated and CSW will follow-up with Mrs. Nicklaus on 8/10.  Ricca Melgarejo Givens, MSW, LCSW Licensed Clinical Social Worker [place] [phone]

## 2015-09-07 ENCOUNTER — Inpatient Hospital Stay (HOSPITAL_COMMUNITY): Payer: Medicare HMO

## 2015-09-07 DIAGNOSIS — N185 Chronic kidney disease, stage 5: Secondary | ICD-10-CM

## 2015-09-07 NOTE — Progress Notes (Signed)
Patient required use of max-imove to return to bed with 2 assists.  Assist with mobility was minimal.

## 2015-09-07 NOTE — Progress Notes (Signed)
PROGRESS NOTE    [name]  ZOX:[id number] DOB: 1926/02/08 DOA: 08/28/2015 PCP: Angelina Sheriff., MD   Chief Complaint  Patient presents with  . Fall     Brief Narrative:  82yo male with hx of AoS, Afib, ESRD, HTN, and Guillain-Barre who presented 7/31 with c/o weakness, malaise and a fall at home. In the ER he had progressive hypotension despite volume and chronic R sided effusion (previously thought ?malignant) with possible infiltrate on CXR. PCCM admitted the pt to the ICU., Workup was significant for Escherichia coli bacteremia, and right lower lobe pneumonia which was treated, patient remained with significant weakness and debilitation, plan to discharge to SNF when he can tolerate hemodialysis in a chair.  Assessment & Plan   RLL PNA  -Completed a 5 day course of empiric abx   E. coli bacteremia -Source not clear - ?Ecoli PNA  -plan to complete a 14 day course of antibiotic therapy for this indication  -transitioned to oral Ceftin, to finish total of 14 days.  Chronic bilateral pleural effusion, left more than right -had thoracentesis 02/14/2015 X 1 liter w/ glucose 106 WBC 160 with L > P, cyt POS atypical cells  -has been evaluated by PCCM - signif L sided effusion noted on f/u CXR 8/5 AM, progressing since prior CXRs  -clinically stable at present, no respiratory distress or hypoxia  -can be followed as an outpatient with Dr. Melvyn Novas.  Hypotension - acute on chronic  -Sepsis w/ bacteremia on baseline of chronic hypotension  -now off pressors  -midodrine continues  -blood pressure stable   Chronic AFib -not on anticoagulation - heart rate well controlled  ESRD on MWF HD -Nephrology following - continues dialysis per usual schedule  Macrocytic anemia -W11 and folic acid levels normal/high - hemoglobin stable  Hypokalemia  -Corrected w/ HD   Elevated alk phos / T bil -on a downward trend - no clear etiology - potentially related to chronic right  pleural effusion  Thrombocytopenia  -Likely due to gram-negative bacteremia - improving   Hypothyroid  -Continue home synthroid  Hx seizures  -Continue home lamictal   History [name]  Gout -Swollen right knee, will xray -continue allopurinol  DVT Prophylaxis  SCDs  Code Status: Partial Code, No CPR/Intubation/Defib. Only BiPAP/ACLS meds  Family Communication: none at bedside  Disposition Plan: Admitted. Needs SNF placement when patient is able to tolerated HD sitting.   Consultants Nephrology PCCM  Procedures  None  Antibiotics   Anti-infectives    Start     Dose/Rate Route Frequency Ordered Stop   09/04/15 1800  cefUROXime (CEFTIN) tablet 500 mg    Comments:  Pharmacy may adjust dose as indicated   500 mg Oral Daily after supper 09/04/15 1414 09/11/15 1759   09/04/15 0700  cefUROXime (CEFTIN) tablet 500 mg  Status:  Discontinued    Comments:  Pharmacy may adjust dose as indicated   500 mg Oral 2 times daily with meals 09/03/15 1157 09/04/15 1414   08/29/15 1800  cefTRIAXone (ROCEPHIN) 1 g in dextrose 5 % 50 mL IVPB  Status:  Discontinued     1 g 100 mL/hr over 30 Minutes Intravenous Every 24 hours 08/28/15 1221 08/29/15 0340   08/29/15 1800  cefTRIAXone (ROCEPHIN) 2 g in dextrose 5 % 50 mL IVPB     2 g 100 mL/hr over 30 Minutes Intravenous Every 24 hours 08/29/15 0340 09/03/15 1806   08/28/15 1215  azithromycin (ZITHROMAX) 500 mg in dextrose 5 %  250 mL IVPB     500 mg 250 mL/hr over 60 Minutes Intravenous Every 24 hours 08/28/15 1212 09/01/15 1407   08/28/15 1200  vancomycin (VANCOCIN) 1,500 mg in sodium chloride 0.9 % 500 mL IVPB  Status:  Discontinued     1,500 mg 250 mL/hr over 120 Minutes Intravenous  Once 08/28/15 1146 08/28/15 1212   08/28/15 1130  ceFEPIme (MAXIPIME) 2 g in dextrose 5 % 50 mL IVPB  Status:  Discontinued     2 g 100 mL/hr over 30 Minutes Intravenous  Once 08/28/15 1129 08/28/15 1212   08/28/15 1130  vancomycin (VANCOCIN) IVPB  1000 mg/200 mL premix  Status:  Discontinued     1,000 mg 200 mL/hr over 60 Minutes Intravenous  Once 08/28/15 1129 08/28/15 1146      Subjective:   [name] seen and examined today.  Has no complaints this morning. Denies chest pain, shortness of breath, abdominal pain.   Objective:   Vitals:   09/06/15 1030 09/06/15 1045 09/06/15 1959 09/07/15 0523  BP: 112/61 124/62 131/85 121/60  Pulse: 68 70 (!) 49 100  Resp:  16 20 18   Temp:  97.7 F (36.5 C) 98.4 F (36.9 C) 98.2 F (36.8 C)  TempSrc:  Oral  Oral  SpO2:   95% 96%  Weight:  64.8 kg (142 lb 13.7 oz) 65.8 kg (145 lb)   Height:        Intake/Output Summary (Last 24 hours) at 09/07/15 1119 Last data filed at 09/07/15 0900  Gross per 24 hour  Intake              270 ml  Output                1 ml  Net              269 ml   Filed Weights   09/06/15 0718 09/06/15 1045 09/06/15 1959  Weight: 67.8 kg (149 lb 7.6 oz) 64.8 kg (142 lb 13.7 oz) 65.8 kg (145 lb)    Exam  General: Well developed, chronically ill appearing, NAD, appears stated age  57: NCAT,mucous membranes moist.   Cardiovascular: S1 S2 auscultated, 2/6 SEM, irregular  Respiratory: poor inspiratory effort, diminished breath sounds  Abdomen: Soft, nontender, nondistended, + bowel sounds  Extremities: warm dry without cyanosis clubbing. Trace LE edema.  Neuro: AAOx3, nonfocal  Psych: Normal affect and demeanor    Data Reviewed: I have personally reviewed following labs and imaging studies  CBC:  Recent Labs Lab 09/01/15 0500 09/02/15 0527 09/03/15 0524 09/04/15 0705 09/06/15 0800  WBC 5.0 5.1 6.4 7.0 7.5  HGB 10.4* 11.0* 11.2* 10.7* 10.9*  HCT 32.8* 34.6* 34.9* 33.1* 34.1*  MCV 103.1* 103.6* 102.9* 104.4* 105.2*  PLT 69* 86* 90* 87* 226*   Basic Metabolic Panel:  Recent Labs Lab 09/01/15 0500 09/02/15 0527 09/03/15 0524 09/04/15 0705 09/06/15 0800  NA 132* 133* 133* 132* 133*  K 4.4 4.2 4.1 4.2 4.6  CL 92* 93* 92* 93*  95*  CO2 27 28 27 28 26   GLUCOSE 94 121* 102* 79 85  BUN 29* 18 26* 34* 36*  CREATININE 4.84* 3.32* 4.09* 4.80* 4.80*  CALCIUM 8.5* 8.7* 9.1 9.0 8.7*  MG 2.5*  --   --   --   --   PHOS 4.9*  --   --  3.7  --    GFR: Estimated Creatinine Clearance: 10.3 mL/min (by C-G formula based on SCr of 4.8 mg/dL).  Liver Function Tests:  Recent Labs Lab 09/02/15 0527 09/03/15 0524 09/04/15 0705  AST 252* 176*  --   ALT 99* 98*  --   ALKPHOS 487* 403*  --   BILITOT 2.3* 1.7*  --   PROT 5.8* 6.0*  --   ALBUMIN 2.7* 2.7* 2.5*   No results for input(s): LIPASE, AMYLASE in the last 168 hours. No results for input(s): AMMONIA in the last 168 hours. Coagulation Profile: No results for input(s): INR, PROTIME in the last 168 hours. Cardiac Enzymes: No results for input(s): CKTOTAL, CKMB, CKMBINDEX, TROPONINI in the last 168 hours. BNP (last 3 results)  Recent Labs  02/21/15 1413  PROBNP 1,353.0*   HbA1C: No results for input(s): HGBA1C in the last 72 hours. CBG: No results for input(s): GLUCAP in the last 168 hours. Lipid Profile: No results for input(s): CHOL, HDL, LDLCALC, TRIG, CHOLHDL, LDLDIRECT in the last 72 hours. Thyroid Function Tests: No results for input(s): TSH, T4TOTAL, FREET4, T3FREE, THYROIDAB in the last 72 hours. Anemia Panel: No results for input(s): VITAMINB12, FOLATE, FERRITIN, TIBC, IRON, RETICCTPCT in the last 72 hours. Urine analysis: No results found for: COLORURINE, APPEARANCEUR, LABSPEC, PHURINE, GLUCOSEU, HGBUR, BILIRUBINUR, KETONESUR, PROTEINUR, UROBILINOGEN, NITRITE, LEUKOCYTESUR Sepsis Labs: @LABRCNTIP (procalcitonin:4,lacticidven:4)  ) Recent Results (from the past 240 hour(s))  MRSA PCR Screening     Status: None   Collection Time: 08/28/15  3:02 PM  Result Value Ref Range Status   MRSA by PCR NEGATIVE NEGATIVE Final    Comment:        The GeneXpert MRSA Assay (FDA approved for NASAL specimens only), is one component of a comprehensive MRSA  colonization surveillance program. It is not intended to diagnose MRSA infection nor to guide or monitor treatment for MRSA infections.       Radiology Studies: No results found.   Scheduled Meds: . allopurinol  100 mg Oral Daily  . antiseptic oral rinse  7 mL Mouth Rinse q12n4p  . calcium carbonate  1 tablet Oral TID WC  . cefUROXime  500 mg Oral QPC supper  . chlorhexidine  15 mL Mouth Rinse BID  . darbepoetin (ARANESP) injection - DIALYSIS  60 mcg Intravenous Q Wed-HD  . feeding supplement  1 Container Oral TID WC  . folic acid  1 mg Oral Daily  . lamoTRIgine  100 mg Oral QHS  . lamoTRIgine  50 mg Oral Daily  . latanoprost  1 drop Both Eyes QHS  . levothyroxine  75 mcg Oral QAC breakfast  . midodrine  10 mg Oral Q6H  . QUEtiapine  12.5 mg Oral QHS  . senna-docusate  1 tablet Oral BID  . sertraline  25 mg Oral Daily  . simvastatin  20 mg Oral QHS  . timolol  1 drop Both Eyes Daily   Continuous Infusions:    LOS: 10 days   Time Spent in minutes   30 minutes  Edison Nicholson D.O. on 09/07/2015 at 11:19 AM  Between 7am to 7pm - Pager - [phone]  After 7pm go to [email or web address] - password [email or web address]  And look for the night coverage person covering for me after hours  [facility] Office  [phone]

## 2015-09-07 NOTE — Progress Notes (Signed)
Physical Therapy Treatment Patient Details Name: [name] MRN: [id number] DOB: 1926/10/10 Today's Date: 10/04/2015    History of Present Illness 82yo male with hx of Afib, ESRD on HD MWF, HTN, andGuillain-Barre who presented 7/31 with c/o weakness, malaise and a fall at home. progressive hypotension, chronic R sided effusion (previously thought ?malignant) with possibleinfiltrate on CXR. +RLL pna, +ecoli bacteremia. In ICU on pressors 7/31-08/03/17    PT Comments    Patient able to perform exercises.  Pain in Rt knee.  Follow Up Recommendations  SNF     Equipment Recommendations  Other (comment) (TBD)    Recommendations for Other Services OT consult     Precautions / Restrictions Precautions Precautions: Fall Restrictions Weight Bearing Restrictions: No    Mobility  Bed Mobility               General bed mobility comments: Patient in chair  Transfers                 General transfer comment: Deferred - await Rt knee xray  Ambulation/Gait                 Stairs            Wheelchair Mobility    Modified Rankin (Stroke Patients Only)       Balance                                    Cognition Arousal/Alertness: Awake/alert Behavior During Therapy: WFL for tasks assessed/performed;Anxious Overall Cognitive Status: No family/caregiver present to determine baseline cognitive functioning                      Exercises General Exercises - Lower Extremity Ankle Circles/Pumps: AROM;Both;10 reps;Seated Quad Sets: AROM;Both;10 reps;Seated Short Arc Quad: AROM;Both;5 reps;Seated Hip ABduction/ADduction: AAROM;Both;5 reps;Seated    General Comments        Pertinent Vitals/Pain Pain Assessment: Faces Faces Pain Scale: Hurts even more Pain Location: Rt knee Pain Descriptors / Indicators: Grimacing;Guarding Pain Intervention(s): Limited activity within patient's tolerance;Repositioned    Home Living                      Prior Function            PT Goals (current goals can now be found in the care plan section) Progress towards PT goals: Progressing toward goals    Frequency  Min 3X/week    PT Plan Current plan remains appropriate    Co-evaluation             End of Session   Activity Tolerance: Patient limited by pain;Patient limited by fatigue (awaiting radiology) Patient left: in chair;with call bell/phone within reach;with chair alarm set     Time: 1341-1352 PT Time Calculation (min) (ACUTE ONLY): 11 min  Charges:  $Therapeutic Exercise: 8-22 mins                    G Codes:      Despina Pole 04-Oct-2015, 2:36 PM Carita Pian. Sanjuana Kava, [place] Pager [phone]

## 2015-09-07 NOTE — Progress Notes (Signed)
Patient transferred to reclining chair with maximum assist of two persons utilizing a gait best.  Patient is unable to bear weight and is complaining of severe pain in right knee.  Knee is swollen and bursa appears to be fluid - filled based on assessment.  Patient is able to assist with turning, but has obvious pain, which he states is from his knees.  Patient is on Allopurinol daily, but also states he takes Colchicine on a prn basis.

## 2015-09-07 NOTE — Progress Notes (Signed)
Subjective:  Tolerated HD yest , noted NH Placement pending / no change in pleasant confusion  Objective Vital signs in last 24 hours: Vitals:   09/06/15 1030 09/06/15 1045 09/06/15 1959 09/07/15 0523  BP: 112/61 124/62 131/85 121/60  Pulse: 68 70 (!) 49 100  Resp:  16 20 18   Temp:  97.7 F (36.5 C) 98.4 F (36.9 C) 98.2 F (36.8 C)  TempSrc:  Oral  Oral  SpO2:   95% 96%  Weight:  64.8 kg (142 lb 13.7 oz) 65.8 kg (145 lb)   Height:       Weight change: -7.497 kg (-16 lb 8.5 oz)  Physical Exam:Physical Exam: General: elderly  Thin chronically il WM alert - and pleasantly confused   Heart: RRR, no rub or mur  Lungs: poor effort and decr bs / nonlabored breathing   / 100%  Rm Air o2 sat Abdomen: soft, non tender, non distended  Extremities: trace bipedal  pitting  edema- / R knee sl swollen (ho gout ) Dialysis Access: left  FA AVF-  Pos bruit with  Aneurysmal areas does not look infected     OP Dialysis: [facility] MWF 3h 21min 64kg 2/2.25 bath Heparin none L arm AVF  Venofer 50 weekly, mircera 75 q 2 weeks- given 7/19  Last hgb 10/4 On TUMS for binding and calcitriol 0.25- last ca=9.7, phos 3.6, PTH 77    Summary: 82 year old WM with multiple medical problems including ESRD presenting with weakness and found to have hypotension beyond normal hypotension for him  Assessment: 1 Hypotension- related to [facility] sepsis/ bacteremia. Poss from PNA.  BPS have  Improved  on Midodrine 10 mg q 6 hrs  Am bp 121/60  2 EColi sepsis/ bacteremia - now on po abx 3 ESRD:MWF [facility] / wt post hd 65  With 3 l uf yest.   Now  up  by only 1 kg by BED wts / cxr pre hd did show  some pulm edema / large L pl effuion  And r pl effusion  4 Hyponatremia - due to vol excess, improving with uf on hd  5 Anemia of ESRD: hgb 10.9 yest /on mircera and venofer as OP- due ESA this week(ordered)   6 Metabolic Bone Disease: normally on calcitriol and tums- / corec  Ca 10.2  Phos 3.7 n Will hold calcitriol  An  use 2.0  Ca bath  7 AMS - slowly improving  8 Gout _ on allopurinol  9  Nuttrion - alb 2.5  Add Protein supplement to dys 3 diet / renal vit 10 Dispo- quite debilitated, need [place] disposition (lived with wife at home )  Ernest Haber, PA-C [place] [phone] 09/07/2015,10:20 AM  LOS: 10 days   Pt seen, examined, agree w assess/plan as above with additions as indicated. Remains sig debilitated, questionable whether he will be able to go back to OP HD or not.  Plan HD in the chair tomorrow on Friday, see if he will tolerate.   Kelly Splinter MD [facility] pager [phone]    cell [phone] 09/07/2015, 1:06 PM     Labs: Basic Metabolic Panel:  Recent Labs Lab 09/01/15 0500  09/03/15 0524 09/04/15 0705 09/06/15 0800  NA 132*  < > 133* 132* 133*  K 4.4  < > 4.1 4.2 4.6  CL 92*  < > 92* 93* 95*  CO2 27  < > 27 28 26   GLUCOSE 94  < > 102* 79  85  BUN 29*  < > 26* 34* 36*  CREATININE 4.84*  < > 4.09* 4.80* 4.80*  CALCIUM 8.5*  < > 9.1 9.0 8.7*  PHOS 4.9*  --   --  3.7  --   < > = values in this interval not displayed. Liver Function Tests:  Recent Labs Lab 09/02/15 0527 09/03/15 0524 09/04/15 0705  AST 252* 176*  --   ALT 99* 98*  --   ALKPHOS 487* 403*  --   BILITOT 2.3* 1.7*  --   PROT 5.8* 6.0*  --   ALBUMIN 2.7* 2.7* 2.5*   No results for input(s): LIPASE, AMYLASE in the last 168 hours. No results for input(s): AMMONIA in the last 168 hours. CBC:  Recent Labs Lab 09/01/15 0500 09/02/15 0527 09/03/15 0524 09/04/15 0705 09/06/15 0800  WBC 5.0 5.1 6.4 7.0 7.5  HGB 10.4* 11.0* 11.2* 10.7* 10.9*  HCT 32.8* 34.6* 34.9* 33.1* 34.1*  MCV 103.1* 103.6* 102.9* 104.4* 105.2*  PLT 69* 86* 90* 87* 110*   Cardiac Enzymes: No results for input(s): CKTOTAL, CKMB, CKMBINDEX, TROPONINI in the last 168 hours. CBG: No results for input(s): GLUCAP in the last 168 hours.  Studies/Results: No results found. Medications:   .  allopurinol  100 mg Oral Daily  . antiseptic oral rinse  7 mL Mouth Rinse q12n4p  . calcium carbonate  1 tablet Oral TID WC  . cefUROXime  500 mg Oral QPC supper  . chlorhexidine  15 mL Mouth Rinse BID  . darbepoetin (ARANESP) injection - DIALYSIS  60 mcg Intravenous Q Wed-HD  . feeding supplement  1 Container Oral TID WC  . folic acid  1 mg Oral Daily  . lamoTRIgine  100 mg Oral QHS  . lamoTRIgine  50 mg Oral Daily  . latanoprost  1 drop Both Eyes QHS  . levothyroxine  75 mcg Oral QAC breakfast  . midodrine  10 mg Oral Q6H  . QUEtiapine  12.5 mg Oral QHS  . senna-docusate  1 tablet Oral BID  . sertraline  25 mg Oral Daily  . simvastatin  20 mg Oral QHS  . timolol  1 drop Both Eyes Daily

## 2015-09-07 NOTE — Care Management Note (Addendum)
Case Management Note  Patient Details  Name: [name] MRN: [id number] Date of Birth: 04/18/1926  Subjective/Objective:            CM following for progression and d/c planning.         Action/Plan: 09/07/2015 Met with pt and wife re d/c planning. Plan is for pt to d/c to SNF for ongoing rehab and 24hr care. Pt OOB today required total assist of RN and tech. Pt rt knee swollen and painful. Pt RN , Marge contacting pt MD. Pt is unable to assist in any way with transfer to chair. Pt wife, Mrs Kant has made contact with several local SNF in the [place] area re admission and CSW Crawford Givens is aware of Mrs Caponi's efforts. Mrs Haberer has been informed by both facilities that transport their residents to outpt hemodialysis that this pt must be able to transfer into a wheelchair and travel to HD. Will continue to follow. This CM has requested that a nursing order be placed in the record for the pt to go to HD tomorrow in the recliner. This CM will also request that the HD staff be aware of need for pt to sit for the HD treatment.   Have also requested that this pt be assisted OOB x2 today.  Dr Ree Kida aware of pt issue with knee and has ordered a x-ray and started medication.   Expected Discharge Date:  09/05/15               Expected Discharge Plan:  Skilled Nursing Facility  In-House Referral:  Clinical Social Work  Discharge planning Services  CM Consult  Post Acute Care Choice:    Choice offered to:     DME Arranged:    DME Agency:     HH Arranged:    [place] Agency:     Status of Service:  In process, will continue to follow  If discussed at Long Length of Stay Meetings, dates discussed:    Additional Comments:  Adron Bene, RN 09/07/2015, 11:45 AM

## 2015-09-07 NOTE — Care Management Important Message (Signed)
Important Message  Patient Details  Name: [name] MRN: [id number] Date of Birth: [birth year]   Medicare Important Message Given:  Yes    Chessie Neuharth 09/07/2015, 11:17 AM

## 2015-09-08 LAB — CBC
HCT: 33.2 % — ABNORMAL LOW (ref 39.0–52.0)
Hemoglobin: 10.7 g/dL — ABNORMAL LOW (ref 13.0–17.0)
MCH: 34 pg (ref 26.0–34.0)
MCHC: 32.2 g/dL (ref 30.0–36.0)
MCV: 105.4 fL — AB (ref 78.0–100.0)
PLATELETS: 103 10*3/uL — AB (ref 150–400)
RBC: 3.15 MIL/uL — ABNORMAL LOW (ref 4.22–5.81)
RDW: 17.6 % — AB (ref 11.5–15.5)
WBC: 6.3 10*3/uL (ref 4.0–10.5)

## 2015-09-08 LAB — BASIC METABOLIC PANEL
ANION GAP: 12 (ref 5–15)
BUN: 31 mg/dL — AB (ref 6–20)
CALCIUM: 8.7 mg/dL — AB (ref 8.9–10.3)
CO2: 28 mmol/L (ref 22–32)
CREATININE: 4.26 mg/dL — AB (ref 0.61–1.24)
Chloride: 94 mmol/L — ABNORMAL LOW (ref 101–111)
GFR calc Af Amer: 13 mL/min — ABNORMAL LOW (ref >60)
GFR, EST NON AFRICAN AMERICAN: 11 mL/min — AB (ref >60)
GLUCOSE: 80 mg/dL (ref 65–99)
Potassium: 4.6 mmol/L (ref 3.5–5.1)
Sodium: 134 mmol/L — ABNORMAL LOW (ref 135–145)

## 2015-09-08 LAB — URIC ACID: Uric Acid, Serum: 1.3 mg/dL — ABNORMAL LOW (ref 4.4–7.6)

## 2015-09-08 MED ORDER — INDOMETHACIN ER 75 MG PO CPCR
75.0000 mg | ORAL_CAPSULE | Freq: Every day | ORAL | Status: DC
  Administered 2015-09-09 – 2015-09-12 (×4): 75 mg via ORAL
  Filled 2015-09-08 (×4): qty 1

## 2015-09-08 MED ORDER — PRO-STAT SUGAR FREE PO LIQD
30.0000 mL | Freq: Two times a day (BID) | ORAL | Status: DC
Start: 2015-09-08 — End: 2015-09-12
  Administered 2015-09-08 – 2015-09-12 (×8): 30 mL via ORAL
  Filled 2015-09-08 (×8): qty 30

## 2015-09-08 NOTE — Progress Notes (Signed)
Pts children would like hospital staff to call one of them as well with pt's wife to help facilitate transitional plan

## 2015-09-08 NOTE — Progress Notes (Signed)
PROGRESS NOTE    [name]  CXK:[id number] DOB: 21-Feb-1926 DOA: 08/28/2015 PCP: Angelina Sheriff., MD   Chief Complaint  Patient presents with  . Fall     Brief Narrative:  82yo male with hx of AoS, Afib, ESRD, HTN, and Guillain-Barre who presented 7/31 with c/o weakness, malaise and a fall at home. In the ER he had progressive hypotension despite volume and chronic R sided effusion (previously thought ?malignant) with possible infiltrate on CXR. PCCM admitted the pt to the ICU., Workup was significant for Escherichia coli bacteremia, and right lower lobe pneumonia which was treated, patient remained with significant weakness and debilitation, plan to discharge to SNF when he can tolerate hemodialysis in a chair.  Assessment & Plan   RLL PNA  -Completed a 5 day course of empiric abx   E. coli bacteremia -Source not clear - ?Ecoli PNA  -plan to complete a 14 day course of antibiotic therapy for this indication  -transitioned to oral Ceftin, to finish total of 14 days.  Chronic bilateral pleural effusion, left more than right -had thoracentesis 02/14/2015 X 1 liter w/ glucose 106 WBC 160 with L > P, cyt POS atypical cells  -has been evaluated by PCCM - signif L sided effusion noted on f/u CXR 8/5 AM, progressing since prior CXRs  -clinically stable at present, no respiratory distress or hypoxia  -can be followed as an outpatient with Dr. Melvyn Novas.  Hypotension - acute on chronic  -Sepsis w/ bacteremia on baseline of chronic hypotension  -now off pressors  -midodrine continues  -blood pressure stable   Chronic AFib -not on anticoagulation - heart rate well controlled  ESRD on MWF HD -Nephrology following - continues dialysis per usual schedule  Macrocytic anemia -H70 and folic acid levels normal/high - hemoglobin stable  Hypokalemia  -Corrected w/ HD   Elevated alk phos / T bil -on a downward trend - no clear etiology - potentially related to chronic right  pleural effusion  Thrombocytopenia  -Likely due to gram-negative bacteremia - improving   Hypothyroid  -Continue home synthroid  Hx seizures  -Continue home lamictal   History [name]  Gout -Swollen right knee, Xray showed moderate suprapatellar effusion -Spoke with orthopedics, Dr. Sharol Given, via phone, who recommended uric acid level and colchicine, outpatient follow up in a few weeks. -continue allopurinol  DVT Prophylaxis  SCDs  Code Status: Partial Code, No CPR/Intubation/Defib. Only BiPAP/ACLS meds  Family Communication: none at bedside  Disposition Plan: Admitted. Needs SNF placement when patient is able to tolerated HD sitting.   Consultants Nephrology PCCM  Procedures  None  Antibiotics   Anti-infectives    Start     Dose/Rate Route Frequency Ordered Stop   09/04/15 1800  cefUROXime (CEFTIN) tablet 500 mg    Comments:  Pharmacy may adjust dose as indicated   500 mg Oral Daily after supper 09/04/15 1414 09/11/15 1759   09/04/15 0700  cefUROXime (CEFTIN) tablet 500 mg  Status:  Discontinued    Comments:  Pharmacy may adjust dose as indicated   500 mg Oral 2 times daily with meals 09/03/15 1157 09/04/15 1414   08/29/15 1800  cefTRIAXone (ROCEPHIN) 1 g in dextrose 5 % 50 mL IVPB  Status:  Discontinued     1 g 100 mL/hr over 30 Minutes Intravenous Every 24 hours 08/28/15 1221 08/29/15 0340   08/29/15 1800  cefTRIAXone (ROCEPHIN) 2 g in dextrose 5 % 50 mL IVPB     2 g 100 mL/hr  over 30 Minutes Intravenous Every 24 hours 08/29/15 0340 09/03/15 1806   08/28/15 1215  azithromycin (ZITHROMAX) 500 mg in dextrose 5 % 250 mL IVPB     500 mg 250 mL/hr over 60 Minutes Intravenous Every 24 hours 08/28/15 1212 09/01/15 1407   08/28/15 1200  vancomycin (VANCOCIN) 1,500 mg in sodium chloride 0.9 % 500 mL IVPB  Status:  Discontinued     1,500 mg 250 mL/hr over 120 Minutes Intravenous  Once 08/28/15 1146 08/28/15 1212   08/28/15 1130  ceFEPIme (MAXIPIME) 2 g in  dextrose 5 % 50 mL IVPB  Status:  Discontinued     2 g 100 mL/hr over 30 Minutes Intravenous  Once 08/28/15 1129 08/28/15 1212   08/28/15 1130  vancomycin (VANCOCIN) IVPB 1000 mg/200 mL premix  Status:  Discontinued     1,000 mg 200 mL/hr over 60 Minutes Intravenous  Once 08/28/15 1129 08/28/15 1146      Subjective:   [name] seen and examined today while in dialysis.  Patient complains of feeling tired and right knee pain.  Denies chest pain, shortness of breath, abdominal pain.   Objective:   Vitals:   09/08/15 0900 09/08/15 0930 09/08/15 1000 09/08/15 1030  BP: (!) 115/56 114/60 (!) 122/57 121/66  Pulse: 79 71 75 73  Resp: 19 18 (!) 21 (!) 21  Temp:      TempSrc:      SpO2: 96% 97% 97% 97%  Weight:      Height:        Intake/Output Summary (Last 24 hours) at 09/08/15 1133 Last data filed at 09/08/15 0618  Gross per 24 hour  Intake              340 ml  Output                1 ml  Net              339 ml   Filed Weights   09/06/15 1959 09/07/15 2039 09/08/15 0825  Weight: 65.8 kg (145 lb) 67.1 kg (147 lb 14.4 oz) 67.1 kg (147 lb 14.9 oz)    Exam  General: Well developed, chronically ill appearing, NAD, appears stated age  82: NCAT,mucous membranes moist.   Cardiovascular: S1 S2 auscultated, 2/6 SEM, irregular  Respiratory: poor inspiratory effort, diminished breath sounds  Abdomen: Soft, nontender, nondistended, + bowel sounds  Extremities: warm dry without cyanosis clubbing. Right knee effusion palpated.   Neuro: AAOx3, nonfocal  Psych: Normal affect and demeanor, pleasant   Data Reviewed: I have personally reviewed following labs and imaging studies  CBC:  Recent Labs Lab 09/02/15 0527 09/03/15 0524 09/04/15 0705 09/06/15 0800 09/08/15 0451  WBC 5.1 6.4 7.0 7.5 6.3  HGB 11.0* 11.2* 10.7* 10.9* 10.7*  HCT 34.6* 34.9* 33.1* 34.1* 33.2*  MCV 103.6* 102.9* 104.4* 105.2* 105.4*  PLT 86* 90* 87* 110* 938*   Basic Metabolic  Panel:  Recent Labs Lab 09/02/15 0527 09/03/15 0524 09/04/15 0705 09/06/15 0800 09/08/15 0451  NA 133* 133* 132* 133* 134*  K 4.2 4.1 4.2 4.6 4.6  CL 93* 92* 93* 95* 94*  CO2 28 27 28 26 28   GLUCOSE 121* 102* 79 85 80  BUN 18 26* 34* 36* 31*  CREATININE 3.32* 4.09* 4.80* 4.80* 4.26*  CALCIUM 8.7* 9.1 9.0 8.7* 8.7*  PHOS  --   --  3.7  --   --    GFR: Estimated Creatinine Clearance: 11.6 mL/min (by C-G  formula based on SCr of 4.26 mg/dL). Liver Function Tests:  Recent Labs Lab 09/02/15 0527 09/03/15 0524 09/04/15 0705  AST 252* 176*  --   ALT 99* 98*  --   ALKPHOS 487* 403*  --   BILITOT 2.3* 1.7*  --   PROT 5.8* 6.0*  --   ALBUMIN 2.7* 2.7* 2.5*   No results for input(s): LIPASE, AMYLASE in the last 168 hours. No results for input(s): AMMONIA in the last 168 hours. Coagulation Profile: No results for input(s): INR, PROTIME in the last 168 hours. Cardiac Enzymes: No results for input(s): CKTOTAL, CKMB, CKMBINDEX, TROPONINI in the last 168 hours. BNP (last 3 results)  Recent Labs  02/21/15 1413  PROBNP 1,353.0*   HbA1C: No results for input(s): HGBA1C in the last 72 hours. CBG: No results for input(s): GLUCAP in the last 168 hours. Lipid Profile: No results for input(s): CHOL, HDL, LDLCALC, TRIG, CHOLHDL, LDLDIRECT in the last 72 hours. Thyroid Function Tests: No results for input(s): TSH, T4TOTAL, FREET4, T3FREE, THYROIDAB in the last 72 hours. Anemia Panel: No results for input(s): VITAMINB12, FOLATE, FERRITIN, TIBC, IRON, RETICCTPCT in the last 72 hours. Urine analysis: No results found for: COLORURINE, APPEARANCEUR, LABSPEC, PHURINE, GLUCOSEU, HGBUR, BILIRUBINUR, KETONESUR, PROTEINUR, UROBILINOGEN, NITRITE, LEUKOCYTESUR Sepsis Labs: @LABRCNTIP (procalcitonin:4,lacticidven:4)  ) No results found for this or any previous visit (from the past 240 hour(s)).    Radiology Studies: Dg Knee 2 Views Right  Result Date: 09/07/2015 CLINICAL DATA:  Chronic  right knee pain without known injury. EXAM: RIGHT KNEE - 3 VIEW COMPARISON:  None. FINDINGS: No fracture or dislocation is noted. Moderate suprapatellar joint effusion is noted. Vascular calcifications are noted. Severe narrowing of medial joint space is noted. IMPRESSION: Severe degenerative joint disease is noted medially. Moderate suprapatellar joint effusion is noted. No acute abnormality seen in the right knee. Electronically Signed   By: Marijo Conception, M.D.   On: 09/07/2015 15:36     Scheduled Meds: . allopurinol  100 mg Oral Daily  . antiseptic oral rinse  7 mL Mouth Rinse q12n4p  . calcium carbonate  1 tablet Oral TID WC  . cefUROXime  500 mg Oral QPC supper  . chlorhexidine  15 mL Mouth Rinse BID  . darbepoetin (ARANESP) injection - DIALYSIS  60 mcg Intravenous Q Wed-HD  . feeding supplement  1 Container Oral TID WC  . folic acid  1 mg Oral Daily  . lamoTRIgine  100 mg Oral QHS  . lamoTRIgine  50 mg Oral Daily  . latanoprost  1 drop Both Eyes QHS  . levothyroxine  75 mcg Oral QAC breakfast  . midodrine  10 mg Oral Q6H  . QUEtiapine  12.5 mg Oral QHS  . senna-docusate  1 tablet Oral BID  . sertraline  25 mg Oral Daily  . simvastatin  20 mg Oral QHS  . timolol  1 drop Both Eyes Daily   Continuous Infusions:    LOS: 11 days   Time Spent in minutes   30 minutes  Daysy Santini D.O. on 09/08/2015 at 11:33 AM  Between 7am to 7pm - Pager - [phone]  After 7pm go to [email or web address] - password [email or web address]  And look for the night coverage person covering for me after hours  [facility] Office  [phone]

## 2015-09-08 NOTE — Care Management (Addendum)
Pt to hemodialysis in chair, 2+ assist to get into chair and back to bed. CM continuing to follow for d/c needs. Pt wife, Mrs Caravantes, called to CM for an update on this progressions. This CM informed Mrs. Delira that Dr Ree Kida planned to discuss pt knee with and orthopedist , Dr Sharol Given.  Will continue to increase activity.

## 2015-09-08 NOTE — Progress Notes (Signed)
Palliative consult request received Chart reviewed in detail Patient was in HD earlier today  He has returned from HD but does not verbalize or interact much.  Wife not present at the bedside currently.   PLAN: Will contact wife and set up family meeting for goals of care discussions for this weekend.  Full note and recommendations to follow Thank you for this consult.   Loistine Chance MD [facility] health palliative medicine team [phone]

## 2015-09-08 NOTE — Progress Notes (Signed)
Physical Therapy Treatment Patient Details Name: [name] MRN: [id number] DOB: Apr 29, 1926 Today's Date: 09/08/2015    History of Present Illness 82yo male with hx of Afib, ESRD on HD MWF, HTN, andGuillain-Barre who presented 7/31 with c/o weakness, malaise and a fall at home. progressive hypotension, chronic R sided effusion (previously thought ?malignant) with possibleinfiltrate on CXR. +RLL pna, +ecoli bacteremia. In ICU on pressors 7/31-08/03/17    PT Comments    Patient alert and participative today.  Able to ambulate 6' with +2 min assist.  Requires mod assist to power up to standing.  Good progress with mobility and gait today.  Follow Up Recommendations  SNF     Equipment Recommendations  Other (comment) (TBD)    Recommendations for Other Services OT consult     Precautions / Restrictions Precautions Precautions: Fall Restrictions Weight Bearing Restrictions: No    Mobility  Bed Mobility Overal bed mobility: Needs Assistance;+2 for physical assistance Bed Mobility: Supine to Sit;Sit to Supine     Supine to sit: Mod assist;+2 for physical assistance Sit to supine: Max assist;+2 for physical assistance   General bed mobility comments: Verbal cues for technique.  Assist to bring LE's off of bed and to raise trunk to sitting.  Once upright, patient able to maintain sitting balance with UE support and min guard assist.  Required max assist to return to supine.  Transfers Overall transfer level: Needs assistance Equipment used: Rolling walker (2 wheeled) Transfers: Sit to/from Stand Sit to Stand: Mod assist;+2 physical assistance;From elevated surface         General transfer comment: Verbal cues for hand placement.  Assist to rise to standing.  Once upright, patient able to maintain standing balance with min assist.  Ambulation/Gait Ambulation/Gait assistance: Min assist;+2 physical assistance Ambulation Distance (Feet): 6 Feet Assistive device: Rolling  walker (2 wheeled) Gait Pattern/deviations: Step-through pattern;Decreased step length - right;Decreased step length - left;Decreased stride length;Shuffle;Trunk flexed;Narrow base of support Gait velocity: decreased   General Gait Details: Patient with very slow gait, with difficulty advancing LLE during swing phase.  Very narrow base of support and flexed posture.   Stairs            Wheelchair Mobility    Modified Rankin (Stroke Patients Only)       Balance   Sitting-balance support: Single extremity supported;Feet supported Sitting balance-Leahy Scale: Fair     Standing balance support: Bilateral upper extremity supported Standing balance-Leahy Scale: Poor                      Cognition Arousal/Alertness: Awake/alert Behavior During Therapy: WFL for tasks assessed/performed;Anxious Overall Cognitive Status: No family/caregiver present to determine baseline cognitive functioning                      Exercises      General Comments        Pertinent Vitals/Pain Pain Assessment: Faces Faces Pain Scale: Hurts even more (During movement) Pain Location: "All over" Pain Descriptors / Indicators: Sore Pain Intervention(s): Monitored during session;Repositioned;Patient requesting pain meds-RN notified    Home Living                      Prior Function            PT Goals (current goals can now be found in the care plan section) Progress towards PT goals: Progressing toward goals    Frequency  Min 3X/week  PT Plan Current plan remains appropriate    Co-evaluation             End of Session Equipment Utilized During Treatment: Gait belt Activity Tolerance: Patient tolerated treatment well;Patient limited by fatigue;Patient limited by pain Patient left: in bed;with call bell/phone within reach;with bed alarm set     Time: 1505-1520 PT Time Calculation (min) (ACUTE ONLY): 15 min  Charges:  $Therapeutic Activity: 8-22  mins                    G Codes:      Despina Pole 09-09-15, 4:12 PM Carita Pian. Sanjuana Kava, [place] Pager [phone]

## 2015-09-08 NOTE — Progress Notes (Addendum)
[facility] Progress Note   Subjective: I'm Ok. On HD oriented to self only. NAD No C/Os.   Objective Vitals:   09/08/15 0900 09/08/15 0930 09/08/15 1000 09/08/15 1030  BP: (!) 115/56 114/60 (!) 122/57 121/66  Pulse: 79 71 75 73  Resp: 19 18 (!) 21 (!) 21  Temp:      TempSrc:      SpO2: 96% 97% 97% 97%  Weight:      Height:       Physical Exam General: frail, elderly male, NAD  Heart: [id number], 2/6 systolic M.  Lungs: BBS CTA anteriorly, dec in bases.  Abdomen: soft nontender, active bs. Extremities: trace ankle edema bilaterally.  Dialysis Access: LFA AVF cannulated at present  Dialysis Orders: [facility] MWF 3h 6min 64kg 2/2.25 bath Heparin none L arm AVF  Venofer 50 weekly, mircera 75 q 2 weeks- given 7/19  Last hgb 10/4 On TUMS for binding and calcitriol 0.25- last ca=9.7, phos 3.6, PTH 77  Additional Objective Labs: Basic Metabolic Panel:  Recent Labs Lab 09/04/15 0705 09/06/15 0800 09/08/15 0451  NA 132* 133* 134*  K 4.2 4.6 4.6  CL 93* 95* 94*  CO2 28 26 28   GLUCOSE 79 85 80  BUN 34* 36* 31*  CREATININE 4.80* 4.80* 4.26*  CALCIUM 9.0 8.7* 8.7*  PHOS 3.7  --   --    Liver Function Tests:  Recent Labs Lab 09/02/15 0527 09/03/15 0524 09/04/15 0705  AST 252* 176*  --   ALT 99* 98*  --   ALKPHOS 487* 403*  --   BILITOT 2.3* 1.7*  --   PROT 5.8* 6.0*  --   ALBUMIN 2.7* 2.7* 2.5*   No results for input(s): LIPASE, AMYLASE in the last 168 hours. CBC:  Recent Labs Lab 09/02/15 0527 09/03/15 0524 09/04/15 0705 09/06/15 0800 09/08/15 0451  WBC 5.1 6.4 7.0 7.5 6.3  HGB 11.0* 11.2* 10.7* 10.9* 10.7*  HCT 34.6* 34.9* 33.1* 34.1* 33.2*  MCV 103.6* 102.9* 104.4* 105.2* 105.4*  PLT 86* 90* 87* 110* 103*   Blood Culture    Component Value Date/Time   SDES BLOOD RIGHT HAND 08/28/2015 0830   SPECREQUEST BOTTLES DRAWN AEROBIC AND ANAEROBIC  5CC 08/28/2015 0830   CULT ESCHERICHIA COLI (A) 08/28/2015 0830   REPTSTATUS 09/01/2015  FINAL 08/28/2015 0830    Cardiac Enzymes: No results for input(s): CKTOTAL, CKMB, CKMBINDEX, TROPONINI in the last 168 hours. CBG: No results for input(s): GLUCAP in the last 168 hours. Iron Studies: No results for input(s): IRON, TIBC, TRANSFERRIN, FERRITIN in the last 72 hours. @lablastinr3 @ Studies/Results: Dg Knee 2 Views Right  Result Date: 09/07/2015 CLINICAL DATA:  Chronic right knee pain without known injury. EXAM: RIGHT KNEE - 3 VIEW COMPARISON:  None. FINDINGS: No fracture or dislocation is noted. Moderate suprapatellar joint effusion is noted. Vascular calcifications are noted. Severe narrowing of medial joint space is noted. IMPRESSION: Severe degenerative joint disease is noted medially. Moderate suprapatellar joint effusion is noted. No acute abnormality seen in the right knee. Electronically Signed   By: Marijo Conception, M.D.   On: 09/07/2015 15:36   Medications:   . allopurinol  100 mg Oral Daily  . antiseptic oral rinse  7 mL Mouth Rinse q12n4p  . calcium carbonate  1 tablet Oral TID WC  . cefUROXime  500 mg Oral QPC supper  . chlorhexidine  15 mL Mouth Rinse BID  . darbepoetin (ARANESP) injection - DIALYSIS  60 mcg Intravenous Q Wed-HD  .  feeding supplement  1 Container Oral TID WC  . folic acid  1 mg Oral Daily  . lamoTRIgine  100 mg Oral QHS  . lamoTRIgine  50 mg Oral Daily  . latanoprost  1 drop Both Eyes QHS  . levothyroxine  75 mcg Oral QAC breakfast  . midodrine  10 mg Oral Q6H  . QUEtiapine  12.5 mg Oral QHS  . senna-docusate  1 tablet Oral BID  . sertraline  25 mg Oral Daily  . simvastatin  20 mg Oral QHS  . timolol  1 drop Both Eyes Daily     Assessment/Plan: 1. RLL PNA: completed antibiotic Rx per primary. No fevers. WBC 6.3 chronic bilateral pleural effusions. 2. E. coli bacteremia: No clear etiology. Has been placed on ceftin for 14 days per primary.  3. Hypotension/volume: R/T sepsis. On Midodrine 10 mg PO q 6 hours. BP controlled. UFG 2000  today. Pre wt 67.1 kg  4. AMS: Not quite back to baseline.  .  5. ESRD -MWF at [facility]. On HD today on schedule. K+ 4.6 6. Anemia - HGB 10.7.  7. Secondary hyperparathyroidism - Ca 8.7 C Ca 9.9 TUMS on hold. Cont VDRA.  8. Nutrition -Last albumin 2.5. Dysphagia III diet. Add prostat/renal vit. 9. Chronic Afib: not on anticoagulant. Per primary 10. Thrombocytopenia: PLT 86 on adm. Now 103. Per primary 11. H/O Gout: cont allopurinol. Possible rx with pred or colchicine. Per primary.  12. Hypothyroidism: per primary. Cont levothyroxine 13. EOL - partial DNR.  Pt has been chron ill for a long time but looks worse than ever now.  Very debilitated. Have d/w primary, will get [facility].     Rita H. Brown NP-C 09/08/2015, 11:24 AM  [facility] [phone]  Pt seen, examined and agree w A/P as above.  Kelly Splinter MD [employer] pager [phone]    cell [phone] 09/08/2015, 1:46 PM

## 2015-09-09 DIAGNOSIS — L8990 Pressure ulcer of unspecified site, unspecified stage: Secondary | ICD-10-CM

## 2015-09-09 DIAGNOSIS — Z515 Encounter for palliative care: Secondary | ICD-10-CM

## 2015-09-09 LAB — BASIC METABOLIC PANEL
Anion gap: 12 (ref 5–15)
BUN: 16 mg/dL (ref 6–20)
CHLORIDE: 95 mmol/L — AB (ref 101–111)
CO2: 29 mmol/L (ref 22–32)
CREATININE: 2.86 mg/dL — AB (ref 0.61–1.24)
Calcium: 8.1 mg/dL — ABNORMAL LOW (ref 8.9–10.3)
GFR calc Af Amer: 21 mL/min — ABNORMAL LOW (ref >60)
GFR, EST NON AFRICAN AMERICAN: 19 mL/min — AB (ref >60)
GLUCOSE: 74 mg/dL (ref 65–99)
Potassium: 3.9 mmol/L (ref 3.5–5.1)
SODIUM: 136 mmol/L (ref 135–145)

## 2015-09-09 LAB — CBC
HCT: 32.8 % — ABNORMAL LOW (ref 39.0–52.0)
Hemoglobin: 10.6 g/dL — ABNORMAL LOW (ref 13.0–17.0)
MCH: 33.9 pg (ref 26.0–34.0)
MCHC: 32.3 g/dL (ref 30.0–36.0)
MCV: 104.8 fL — AB (ref 78.0–100.0)
PLATELETS: 106 10*3/uL — AB (ref 150–400)
RBC: 3.13 MIL/uL — ABNORMAL LOW (ref 4.22–5.81)
RDW: 17.5 % — ABNORMAL HIGH (ref 11.5–15.5)
WBC: 6.1 10*3/uL (ref 4.0–10.5)

## 2015-09-09 NOTE — Consult Note (Signed)
Consultation Note Date: 09/09/2015   Patient Name: [name]  DOB: [birth year]  MRN: [id number]  Age / Sex: 82 y.o., male  PCP: Angelina Sheriff, MD Referring Physician: Cristal Ford, DO  Reason for Consultation: Establishing goals of care  HPI/Patient Profile: 82 y.o. male    admitted on 08/28/2015    Clinical Assessment and Goals of Care:  [name] is a 82 year old gentleman who lives at home with his wife. He has been on dialysis for 14 years now. He has been admitted to the hospital since 7-31. Hospital course includes right lower lobe pneumonia, Escherichia coli bacteremia, sepsis. Patient has gout chronic pain in his right knee. He has chronic problems related to peripheral neuropathy, seizures and memory disturbance. He saw his neurologist in July. He was maintained on Lamictal, Remeron and Zoloft in the outpatient setting.the patient has had progressive functional decline especially since the last month. Patient has anemia of end-stage renal disease, metabolic bone disease. He has had altered mental status for most of this hospitalization. He remains on antibiotics for Escherichia coli sepsis/bacteremia. He has been having pain in his knee. Efforts are underway to see if he can tolerate sitting for dialysis. Efforts are underway for finding a skilled nursing facility for him to go to towards the end of this hospitalization. Palliative consultation has been requested by nephrology specialists owing to the patient's functional decline and cognitive decline.   Patient is an elderly gentleman resting in bed. He opens his eyes when his name is called. He is not able to converse freely. He is not able to participate in any aspects of history taking. I did meet with the patient's wife Mrs. [name] as well as the patient's daughter [name] outside the patient's room this afternoon. I introduced myself and  palliative care as follows:  Palliative medicine is specialized medical care for people living with serious illness. It focuses on providing relief from the symptoms and stress of a serious illness. The goal is to improve quality of life for both the patient and the family.  Patient's wife states that the patient has had gradual progressive decline especially since the past 3-4 weeks. She states that he is essentially sedentary. She states that he has told him, "I don't think I can go on like this" regards to ongoing dialysis. Patient's goals and wishes attempted to be elicited to his family. They were eager for information pertaining to discontinuation/withdrawal of dialysis, establishment of comfort measures only. All questions answered. I will follow-up on 8-13.  NEXT OF KIN  wife [name].   SUMMARY OF RECOMMENDATIONS    Based on the patient's sub acute functional decline, patient's wife and daughter are wondering whether continuation of dialysis has become more of a burden than benefit for this patient. They also wonder if the patient would benefit from going to SNF.  Brief life review performed, brief over view of withdrawal of dialysis, comfort measures discussed. I will follow up on 09-10-15.   Code Status/Advance Care Planning:  Limited code    Symptom Management:     Continue current management. Palliative Prophylaxis:   Bowel Regimen   Psycho-social/Spiritual:   Desire for further Chaplaincy support:no  Additional Recommendations: Education on Hospice  Prognosis:   Guarded, given ongoing decline.   Discharge Planning: To Be Determined      Primary Diagnoses: Present on Admission: . Hypotension   I have reviewed the medical record, interviewed the patient and family, and examined the patient. The following aspects are pertinent.  Past Medical History:  Diagnosis Date  . Acute infective polyneuritis ([place])   . Anemia    of chronic disease  . Aortic  stenosis   . Atrial fibrillation ([place])   . Benign prostatic hypertrophy   . Colon cancer ([place])   . Colon polyp    adenomatous  . Dementia   . Depression   . Diverticulosis   . Encounter for long-term (current) use of other medications   . End stage renal disease ([place])   . Gout   . Hearing loss   . Hernia of unspecified site of abdominal cavity without mention of obstruction or gangrene    hiatal  . History of Clostridium difficile infection   . History of Guillain-Barre syndrome   . Hyperlipidemia   . Hyperparathyroidism   . Hyperparathyroidism ([place])   . Hypertension   . Hypothyroidism   . Internal hemorrhoids   . Pancytopenia ([place])   . Polyneuropathy in other diseases classified elsewhere ([place])   . Proctitis   . Renal failure   . Seizures ([place])   . Unspecified hereditary and idiopathic peripheral neuropathy   . Unspecified transient cerebral ischemia    Social History   Social History  . Marital status: Married    Spouse name: [place]  . Number of children: 3  . Years of education: 12   Occupational History  . Retired Retired    Retired   Social History Main Topics  . Smoking status: Former Smoker    Packs/day: 3.00    Years: 40.00    Types: Cigarettes    Quit date: 10/29/1960  . Smokeless tobacco: Never Used  . Alcohol use No  . Drug use: No  . Sexual activity: Not Asked   Other Topics Concern  . None   Social History Narrative   Patient is married and to [place].    Caffeine three times a week.   Right handed.   Education- 12 th grade.   Family History  Problem Relation Age of Onset  . Stroke Father   . Diabetes Brother   . Liver disease Son   . Colon cancer Neg Hx    Scheduled Meds: . allopurinol  100 mg Oral Daily  . antiseptic oral rinse  7 mL Mouth Rinse q12n4p  . calcium carbonate  1 tablet Oral TID WC  . cefUROXime  500 mg Oral QPC supper  . chlorhexidine  15 mL Mouth Rinse BID  . darbepoetin (ARANESP) injection - DIALYSIS  60 mcg  Intravenous Q Wed-HD  . feeding supplement  1 Container Oral TID WC  . feeding supplement (PRO-STAT SUGAR FREE 64)  30 mL Oral BID  . folic acid  1 mg Oral Daily  . indomethacin  75 mg Oral Q breakfast  . lamoTRIgine  100 mg Oral QHS  . lamoTRIgine  50 mg Oral Daily  . latanoprost  1 drop Both Eyes QHS  . levothyroxine  75 mcg Oral QAC breakfast  . midodrine  10 mg  Oral Q6H  . QUEtiapine  12.5 mg Oral QHS  . senna-docusate  1 tablet Oral BID  . sertraline  25 mg Oral Daily  . simvastatin  20 mg Oral QHS  . timolol  1 drop Both Eyes Daily   Continuous Infusions:  PRN Meds:.ondansetron (ZOFRAN) IV Medications Prior to Admission:  Prior to Admission medications   Medication Sig Start Date End Date Taking? Authorizing Provider  allopurinol (ZYLOPRIM) 100 MG tablet Take 100 mg by mouth daily. 06/22/12  Yes Historical Provider, MD  aspirin 81 MG tablet Take 81 mg by mouth daily.     Yes Historical Provider, MD  B Complex-C-Folic Acid (DIALYVITE TABLET) TABS Take 1 tablet by mouth daily.  08/11/12  Yes Historical Provider, MD  calcium carbonate (TUMS - DOSED IN MG ELEMENTAL CALCIUM) 500 MG chewable tablet Chew 1 tablet by mouth 2 (two) times daily with a meal.    Yes Historical Provider, MD  carvedilol (COREG) 25 MG tablet Take 25 mg by mouth at bedtime.    Yes Historical Provider, MD  Cholecalciferol (VITAMIN D-3 PO) Take 2 tablets by mouth every morning.    Yes Historical Provider, MD  folic acid (FOLVITE) 1 MG tablet Take 1 mg by mouth daily.     Yes Historical Provider, MD  lamoTRIgine (LAMICTAL) 100 MG tablet TAKE 1/2 TABLET BY MOUTH EVERY MORNING AND 1 TABLET AT BEDTIME 08/08/15  Yes Ward Givens, NP  latanoprost (XALATAN) 0.005 % ophthalmic solution Place 1 drop into both eyes at bedtime.  07/18/14  Yes Historical Provider, MD  levothyroxine (SYNTHROID, LEVOTHROID) 75 MCG tablet Take 75 mcg by mouth daily before breakfast.   Yes Historical Provider, MD  mirtazapine (REMERON) 30 MG tablet  Take 1 tablet (30 mg total) by mouth at bedtime. 08/08/15  Yes Ward Givens, NP  omeprazole (PRILOSEC) 20 MG capsule Take 20 mg by mouth daily. 07/18/12  Yes Historical Provider, MD  sertraline (ZOLOFT) 25 MG tablet Take 1 tablet (25 mg total) by mouth daily. 08/08/15  Yes Ward Givens, NP  simvastatin (ZOCOR) 20 MG tablet Take 20 mg by mouth at bedtime.     Yes Historical Provider, MD  timolol (BETIMOL) 0.5 % ophthalmic solution Place 1 drop into both eyes daily.   Yes Historical Provider, MD   No Known Allergies Review of Systems Not verbal.   Physical Exam Physical Exam General: frail, elderly male, NAD  Heart: S1,S2,   Lungs: diminished in bases.  Abdomen: soft nontender, active bs. Extremities: trace ankle edema bilaterally.  Left forearm fistula.   Vital Signs: BP (!) 133/96 (BP Location: Right Arm)   Pulse (!) 102   Temp 98.3 F (36.8 C) (Oral)   Resp 17   Ht 5\' 7"  (1.702 m)   Wt 70.3 kg (154 lb 14.4 oz)   SpO2 100%   BMI 24.26 kg/m  Pain Assessment: No/denies pain POSS *See Group Information*: S-Acceptable,Sleep, easy to arouse Pain Score: 0-No pain   SpO2: SpO2: 100 % O2 Device:SpO2: 100 % O2 Flow Rate: .O2 Flow Rate (L/min): 2 L/min  IO: Intake/output summary:  Intake/Output Summary (Last 24 hours) at 09/09/15 1343 Last data filed at 09/09/15 1056  Gross per 24 hour  Intake              360 ml  Output                0 ml  Net  360 ml    LBM: Last BM Date: 09/07/15 Baseline Weight: Weight: 64.4 kg (142 lb) Most recent weight: Weight: 70.3 kg (154 lb 14.4 oz)     Palliative Assessment/Data:   Flowsheet Rows   Flowsheet Row Most Recent Value  Intake Tab  Referral Department  Hospitalist  Unit at Time of Referral  Med/Surg Unit  Palliative Care Primary Diagnosis  Nephrology  Date Notified  09/08/15  Palliative Care Type  New Palliative care  Reason for referral  Clarify Goals of Care  Date of Admission  08/28/15  Date first seen by  Palliative Care  09/08/15  # of days Palliative referral response time  0 Day(s)  # of days IP prior to Palliative referral  11  Clinical Assessment  Palliative Performance Scale Score  30%  Pain Max last 24 hours  6  Pain Min Last 24 hours  5  Dyspnea Max Last 24 Hours  5  Dyspnea Min Last 24 hours  4  Nausea Max Last 24 Hours  5  Nausea Min Last 24 Hours  4  Psychosocial & Spiritual Assessment  Palliative Care Outcomes  Patient/Family meeting held?  Yes  Who was at the meeting?  wife daughter   Palliative Care Outcomes  Clarified goals of care  Palliative Care follow-up planned  Yes, Facility      Time In:  1230 Time Out:  1330 Time Total:  60 min  Greater than 50%  of this time was spent counseling and coordinating care related to the above assessment and plan.  Signed by: Loistine Chance, MD  [phone]  Please contact Palliative Medicine Team phone at [phone] for questions and concerns.  For individual provider: See Amion password [facility]

## 2015-09-09 NOTE — Clinical Social Work Note (Signed)
Clinical Social Work Assessment  Patient Details  Name: [name] MRN: [id number] Date of Birth: [birth year]  Date of referral:  09/09/15               Reason for consult:  Discharge Planning                Permission sought to share information with:  Case Manager, Facility Sport and exercise psychologist, Family Supports Permission granted to share information::  No  Name::        Agency::     Relationship::     Contact Information:     Housing/Transportation Living arrangements for the past 2 months:  Single Family Home Source of Information:  Patient, Medical Team Patient Interpreter Needed:  None Criminal Activity/Legal Involvement Pertinent to Current Situation/Hospitalization:  No - Comment as needed Significant Relationships:  Adult Children, Spouse Lives with:  Spouse Do you feel safe going back to the place where you live?  No Need for family participation in patient care:  Yes (Comment)  Care giving concerns: Pt unresponsive and did not participate in assessment. No family at pt bedside , CSW contacted pt wife via phone and left message.    Social Worker assessment / plan: Holiday representative met with pt to discuss CSW role with discharge planning. Pt unwilling to participate in assessment. CSW contacted pt wife by phone and left message.   Employment status:  Retired Forensic scientist:  Other (Comment Required) Doctor, general practice) PT Recommendations:  [place] / Referral to community resources:  [place]  Patient/Family's Response to care: Pt unresponsive and unwilling to participate in assessment.   Patient/Family's Understanding of and Emotional Response to Diagnosis, Current Treatment, and Prognosis:  Pt unresponsive. CSW contacted pt wife and left message.   Emotional Assessment Appearance:  Appears stated age Attitude/Demeanor/Rapport:  Unresponsive, Uncooperative Affect (typically observed):  Unable to  Assess Orientation:  Oriented to Self, Oriented to Place, Oriented to  Time, Oriented to Situation Alcohol / Substance use:  Not Applicable Psych involvement (Current and /or in the community):  No (Comment)  Discharge Needs  Concerns to be addressed:  Discharge Planning Concerns Readmission within the last 30 days:  No Current discharge risk:  None Barriers to Discharge:  Continued Medical Work up   [employer], LCSW 09/09/2015, 1:44 PM

## 2015-09-09 NOTE — Progress Notes (Signed)
[facility] Progress Note   Subjective: awake, no c/o's, interacts minimally verbally  Objective Vitals:   09/08/15 1608 09/08/15 2054 09/09/15 0435 09/09/15 0744  BP: (!) 110/37 (!) 98/54 130/83 (!) 133/96  Pulse: 69 77 (!) 106 (!) 102  Resp: 20 19 18 17   Temp: 98.3 F (36.8 C) 98.4 F (36.9 C) 97.7 F (36.5 C) 98.3 F (36.8 C)  TempSrc: Oral Oral Oral Oral  SpO2: 100% 97% 94% 100%  Weight:  70.3 kg (154 lb 14.4 oz)    Height:       Physical Exam General: frail, elderly male, NAD  Heart: [id number], 2/6 systolic M.  Lungs: BBS CTA anteriorly, dec in bases.  Abdomen: soft nontender, active bs. Extremities: trace ankle edema bilaterally.  Dialysis Access: LFA AVF cannulated at present  Dialysis:  [facility] MWF 3h 40min 64kg 2/2.25 bath Heparin none L arm AVF  Venofer 50 weekly, mircera 75 q 2 weeks- given 7/19  Last hgb 10/4 On TUMS for binding and calcitriol 0.25- last ca=9.7, phos 3.6, PTH 77   Assessment:  1. RLL PNA: completed antibiotic Rx per primary 2. E. coli bacteremia: No clear etiology. Has been placed on ceftin for 14 days per primary.  3. Hypotension/volume: R/T sepsis. On Midodrine 10 mg PO q 6 hours.  4. AMS: Not quite back to baseline.  .  5. ESRD -MWF at [facility]. On HD today on schedule. K+ 4.6 6. Anemia - HGB 10.7.  7. Secondary hyperparathyroidism - Ca 8.7 C Ca 9.9 TUMS on hold. Cont VDRA.  8. Nutrition -Last albumin 2.5. Dysphagia III diet. Add prostat/renal vit. 9. Chronic Afib: not on anticoagulant. Per primary 10. Thrombocytopenia: PLT 86 on adm. Now 103. Per primary 11. H/O Gout: cont allopurinol. Possible rx with pred or colchicine. Per primary.  12. Hypothyroidism: per primary. Cont levothyroxine 13. EOL - partial DNR. Poor prognosis, severely debilitated.  Pt has been chron ill for a long time but looks much worse now.  Palliative care has been consulted.  Plan - HD Monday.   Kelly Splinter MD [facility] pager  [phone]    cell [phone] 09/09/2015, 12:55 PM       Additional Objective Labs: Basic Metabolic Panel:  Recent Labs Lab 09/04/15 0705 09/06/15 0800 09/08/15 0451 09/09/15 0336  NA 132* 133* 134* 136  K 4.2 4.6 4.6 3.9  CL 93* 95* 94* 95*  CO2 28 26 28 29   GLUCOSE 79 85 80 74  BUN 34* 36* 31* 16  CREATININE 4.80* 4.80* 4.26* 2.86*  CALCIUM 9.0 8.7* 8.7* 8.1*  PHOS 3.7  --   --   --    Liver Function Tests:  Recent Labs Lab 09/03/15 0524 09/04/15 0705  AST 176*  --   ALT 98*  --   ALKPHOS 403*  --   BILITOT 1.7*  --   PROT 6.0*  --   ALBUMIN 2.7* 2.5*   No results for input(s): LIPASE, AMYLASE in the last 168 hours. CBC:  Recent Labs Lab 09/03/15 0524 09/04/15 0705 09/06/15 0800 09/08/15 0451 09/09/15 0336  WBC 6.4 7.0 7.5 6.3 6.1  HGB 11.2* 10.7* 10.9* 10.7* 10.6*  HCT 34.9* 33.1* 34.1* 33.2* 32.8*  MCV 102.9* 104.4* 105.2* 105.4* 104.8*  PLT 90* 87* 110* 103* 106*   Blood Culture    Component Value Date/Time   SDES BLOOD RIGHT HAND 08/28/2015 0830   SPECREQUEST BOTTLES DRAWN AEROBIC AND ANAEROBIC  5CC 08/28/2015 0830   CULT ESCHERICHIA COLI (  A) 08/28/2015 0830   REPTSTATUS 09/01/2015 FINAL 08/28/2015 0830    Cardiac Enzymes: No results for input(s): CKTOTAL, CKMB, CKMBINDEX, TROPONINI in the last 168 hours. CBG: No results for input(s): GLUCAP in the last 168 hours. Iron Studies: No results for input(s): IRON, TIBC, TRANSFERRIN, FERRITIN in the last 72 hours. @lablastinr3 @ Studies/Results: Dg Knee 2 Views Right  Result Date: 09/07/2015 CLINICAL DATA:  Chronic right knee pain without known injury. EXAM: RIGHT KNEE - 3 VIEW COMPARISON:  None. FINDINGS: No fracture or dislocation is noted. Moderate suprapatellar joint effusion is noted. Vascular calcifications are noted. Severe narrowing of medial joint space is noted. IMPRESSION: Severe degenerative joint disease is noted medially. Moderate suprapatellar joint effusion is noted. No acute  abnormality seen in the right knee. Electronically Signed   By: Marijo Conception, M.D.   On: 09/07/2015 15:36   Medications:   . allopurinol  100 mg Oral Daily  . antiseptic oral rinse  7 mL Mouth Rinse q12n4p  . calcium carbonate  1 tablet Oral TID WC  . cefUROXime  500 mg Oral QPC supper  . chlorhexidine  15 mL Mouth Rinse BID  . darbepoetin (ARANESP) injection - DIALYSIS  60 mcg Intravenous Q Wed-HD  . feeding supplement  1 Container Oral TID WC  . feeding supplement (PRO-STAT SUGAR FREE 64)  30 mL Oral BID  . folic acid  1 mg Oral Daily  . indomethacin  75 mg Oral Q breakfast  . lamoTRIgine  100 mg Oral QHS  . lamoTRIgine  50 mg Oral Daily  . latanoprost  1 drop Both Eyes QHS  . levothyroxine  75 mcg Oral QAC breakfast  . midodrine  10 mg Oral Q6H  . QUEtiapine  12.5 mg Oral QHS  . senna-docusate  1 tablet Oral BID  . sertraline  25 mg Oral Daily  . simvastatin  20 mg Oral QHS  . timolol  1 drop Both Eyes Daily

## 2015-09-09 NOTE — Progress Notes (Signed)
PROGRESS NOTE    [name]  KDT:[id number] DOB: [birth year] DOA: 08/28/2015 PCP: Angelina Sheriff., MD   Chief Complaint  Patient presents with  . Fall     Brief Narrative:  82yo male with hx of AoS, Afib, ESRD, HTN, and Guillain-Barre who presented 7/31 with c/o weakness, malaise and a fall at home. In the ER he had progressive hypotension despite volume and chronic R sided effusion (previously thought ?malignant) with possible infiltrate on CXR. PCCM admitted the pt to the ICU., Workup was significant for Escherichia coli bacteremia, and right lower lobe pneumonia which was treated, patient remained with significant weakness and debilitation, plan to discharge to SNF when he can tolerate hemodialysis in a chair.  Assessment & Plan   RLL PNA  -Completed a 5 day course of empiric abx   E. coli bacteremia -Source not clear - ?Ecoli PNA  -plan to complete a 14 day course of antibiotic therapy for this indication  -transitioned to oral Ceftin, to finish total of 14 days.  Chronic bilateral pleural effusion, left more than right -had thoracentesis 02/14/2015 X 1 liter w/ glucose 106 WBC 160 with L > P, cyt POS atypical cells  -has been evaluated by PCCM - signif L sided effusion noted on f/u CXR 8/5 AM, progressing since prior CXRs  -clinically stable at present, no respiratory distress or hypoxia  -can be followed as an outpatient with Dr. Melvyn Novas.  Hypotension - acute on chronic  -Sepsis w/ bacteremia on baseline of chronic hypotension  -now off pressors  -Continue midodrine -blood pressure stable   Chronic AFib -not on anticoagulation - heart rate well controlled  ESRD on MWF HD -Nephrology following - continues dialysis per usual schedule  Macrocytic anemia -D98 and folic acid levels normal/high - hemoglobin stable, currently 10.6  Hypokalemia  -Corrected w/ HD   Elevated alk phos / T bil -on a downward trend - no clear etiology - potentially related to  chronic right pleural effusion  Thrombocytopenia  -Likely due to gram-negative bacteremia - improving  -Platelets 106 today  Hypothyroid  -Continue home synthroid  Hx seizures  -Continue home lamictal   History [name]  Gout -Swollen right knee, Xray showed moderate suprapatellar effusion -Spoke with orthopedics, Dr. Sharol Given, via phone, who recommended uric acid level and colchicine, outpatient follow up in a few weeks. -continue allopurinol -Added indocin   Goals of care -Spoke with Dr. Jonnie Finner who is more familiar with the patient than myself. He feels the patient has become more debilitated given his recent illnesses.   -Will consult palliative care for [place] discussion and family meeting -Patient currently is a partial code, no CPR or intubation.  DVT Prophylaxis  SCDs  Code Status: Partial Code, No CPR/Intubation/Defib. Only BiPAP/ACLS meds  Family Communication: none at bedside  Disposition Plan: Admitted. Needs SNF placement when patient is able to tolerated HD sitting.   Consultants Nephrology PCCM  Procedures  None  Antibiotics   Anti-infectives    Start     Dose/Rate Route Frequency Ordered Stop   09/04/15 1800  cefUROXime (CEFTIN) tablet 500 mg    Comments:  Pharmacy may adjust dose as indicated   500 mg Oral Daily after supper 09/04/15 1414 09/11/15 1759   09/04/15 0700  cefUROXime (CEFTIN) tablet 500 mg  Status:  Discontinued    Comments:  Pharmacy may adjust dose as indicated   500 mg Oral 2 times daily with meals 09/03/15 1157 09/04/15 1414   08/29/15 1800  cefTRIAXone (ROCEPHIN) 1 g in dextrose 5 % 50 mL IVPB  Status:  Discontinued     1 g 100 mL/hr over 30 Minutes Intravenous Every 24 hours 08/28/15 1221 08/29/15 0340   08/29/15 1800  cefTRIAXone (ROCEPHIN) 2 g in dextrose 5 % 50 mL IVPB     2 g 100 mL/hr over 30 Minutes Intravenous Every 24 hours 08/29/15 0340 09/03/15 1806   08/28/15 1215  azithromycin (ZITHROMAX) 500 mg in dextrose 5 %  250 mL IVPB     500 mg 250 mL/hr over 60 Minutes Intravenous Every 24 hours 08/28/15 1212 09/01/15 1407   08/28/15 1200  vancomycin (VANCOCIN) 1,500 mg in sodium chloride 0.9 % 500 mL IVPB  Status:  Discontinued     1,500 mg 250 mL/hr over 120 Minutes Intravenous  Once 08/28/15 1146 08/28/15 1212   08/28/15 1130  ceFEPIme (MAXIPIME) 2 g in dextrose 5 % 50 mL IVPB  Status:  Discontinued     2 g 100 mL/hr over 30 Minutes Intravenous  Once 08/28/15 1129 08/28/15 1212   08/28/15 1130  vancomycin (VANCOCIN) IVPB 1000 mg/200 mL premix  Status:  Discontinued     1,000 mg 200 mL/hr over 60 Minutes Intravenous  Once 08/28/15 1129 08/28/15 1146      Subjective:   [name] seen and examined today.  Patient continues to complain of weakness.  Complains of nausea and vomiting this morning after breakfast.  Denies chest pain, shortness of breath, abdominal pain.   Objective:   Vitals:   09/08/15 1608 09/08/15 2054 09/09/15 0435 09/09/15 0744  BP: (!) 110/37 (!) 98/54 130/83 (!) 133/96  Pulse: 69 77 (!) 106 (!) 102  Resp: _0 Temp: 98.3 F (36.8 C) 98.4 F (36.9 C) 97.7 F (36.5 C) 98.3 F (36.8 C)  TempSrc: Oral Oral Oral Oral  SpO2: 100% 97% 94% 100%  Weight:  70.3 kg (154 lb 14.4 oz)    Height:        Intake/Output Summary (Last 24 hours) at 09/09/15 1129 Last data filed at 09/09/15 1056  Gross per 24 hour  Intake              360 ml  Output             2000 ml  Net            -1640 ml   Filed Weights   09/08/15 0825 09/08/15 1239 09/08/15 2054  Weight: 67.1 kg (147 lb 14.9 oz) 65.1 kg (143 lb 8.3 oz) 70.3 kg (154 lb 14.4 oz)    Exam  General: Well developed, chronically ill appearing, NAD  HEENT: NCAT,mucous membranes moist.   Cardiovascular: S1 S2 auscultated, 2/6 SEM, irregular  Respiratory: poor inspiratory effort, diminished breath sounds  Abdomen: Soft, nontender, nondistended, + bowel sounds  Extremities: warm dry without cyanosis clubbing. Right  knee effusion palpated, nontender.   Neuro: AAOx3, nonfocal  Psych: Flat affect   Data Reviewed: I have personally reviewed following labs and imaging studies  CBC:  Recent Labs Lab 09/03/15 0524 09/04/15 0705 09/06/15 0800 09/08/15 0451 09/09/15 0336  WBC 6.4 7.0 7.5 6.3 6.1  HGB 11.2* 10.7* 10.9* 10.7* 10.6*  HCT 34.9* 33.1* 34.1* 33.2* 32.8*  MCV 102.9* 104.4* 105.2* 105.4* 104.8*  PLT 90* 87* 110* 103* 027*   Basic Metabolic Panel:  Recent Labs Lab 09/03/15 0524 09/04/15 0705 09/06/15 0800 09/08/15 0451 09/09/15 0336  NA 133* 132* 133* 134* 136  K  4.1 4.2 4.6 4.6 3.9  CL 92* 93* 95* 94* 95*  CO2 _0 GLUCOSE 102* 79 85 80 74  BUN 26* 34* 36* 31* 16  CREATININE 4.09* 4.80* 4.80* 4.26* 2.86*  CALCIUM 9.1 9.0 8.7* 8.7* 8.1*  PHOS  --  3.7  --   --   --    GFR: Estimated Creatinine Clearance: 17.3 mL/min (by C-G formula based on SCr of 2.86 mg/dL). Liver Function Tests:  Recent Labs Lab 09/03/15 0524 09/04/15 0705  AST 176*  --   ALT 98*  --   ALKPHOS 403*  --   BILITOT 1.7*  --   PROT 6.0*  --   ALBUMIN 2.7* 2.5*   No results for input(s): LIPASE, AMYLASE in the last 168 hours. No results for input(s): AMMONIA in the last 168 hours. Coagulation Profile: No results for input(s): INR, PROTIME in the last 168 hours. Cardiac Enzymes: No results for input(s): CKTOTAL, CKMB, CKMBINDEX, TROPONINI in the last 168 hours. BNP (last 3 results)  Recent Labs  02/21/15 1413  PROBNP 1,353.0*   HbA1C: No results for input(s): HGBA1C in the last 72 hours. CBG: No results for input(s): GLUCAP in the last 168 hours. Lipid Profile: No results for input(s): CHOL, HDL, LDLCALC, TRIG, CHOLHDL, LDLDIRECT in the last 72 hours. Thyroid Function Tests: No results for input(s): TSH, T4TOTAL, FREET4, T3FREE, THYROIDAB in the last 72 hours. Anemia Panel: No results for input(s): VITAMINB12, FOLATE, FERRITIN, TIBC, IRON, RETICCTPCT in the last 72  hours. Urine analysis: No results found for: COLORURINE, APPEARANCEUR, LABSPEC, PHURINE, GLUCOSEU, HGBUR, BILIRUBINUR, KETONESUR, PROTEINUR, UROBILINOGEN, NITRITE, LEUKOCYTESUR Sepsis Labs: _1 (procalcitonin:4,lacticidven:4)  ) No results found for this or any previous visit (from the past 240 hour(s)).    Radiology Studies: Dg Knee 2 Views Right  Result Date: 09/07/2015 CLINICAL DATA:  Chronic right knee pain without known injury. EXAM: RIGHT KNEE - 3 VIEW COMPARISON:  None. FINDINGS: No fracture or dislocation is noted. Moderate suprapatellar joint effusion is noted. Vascular calcifications are noted. Severe narrowing of medial joint space is noted. IMPRESSION: Severe degenerative joint disease is noted medially. Moderate suprapatellar joint effusion is noted. No acute abnormality seen in the right knee. Electronically Signed   By: Marijo Conception, M.D.   On: 09/07/2015 15:36     Scheduled Meds: . allopurinol  100 mg Oral Daily  . antiseptic oral rinse  7 mL Mouth Rinse q12n4p  . calcium carbonate  1 tablet Oral TID WC  . cefUROXime  500 mg Oral QPC supper  . chlorhexidine  15 mL Mouth Rinse BID  . darbepoetin (ARANESP) injection - DIALYSIS  60 mcg Intravenous Q Wed-HD  . feeding supplement  1 Container Oral TID WC  . feeding supplement (PRO-STAT SUGAR FREE 64)  30 mL Oral BID  . folic acid  1 mg Oral Daily  . indomethacin  75 mg Oral Q breakfast  . lamoTRIgine  100 mg Oral QHS  . lamoTRIgine  50 mg Oral Daily  . latanoprost  1 drop Both Eyes QHS  . levothyroxine  75 mcg Oral QAC breakfast  . midodrine  10 mg Oral Q6H  . QUEtiapine  12.5 mg Oral QHS  . senna-docusate  1 tablet Oral BID  . sertraline  25 mg Oral Daily  . simvastatin  20 mg Oral QHS  . timolol  1 drop Both Eyes Daily   Continuous Infusions:    LOS: 12 days   Time Spent in  minutes   30 minutes  Hermenegildo Clausen D.O. on 09/09/2015 at 11:29 AM  Between 7am to 7pm - Pager - [phone]  After 7pm  go to [email or web address] - password [email or web address]  And look for the night coverage person covering for me after hours  [facility] Office  [phone]

## 2015-09-10 ENCOUNTER — Other Ambulatory Visit: Payer: Self-pay | Admitting: Adult Health

## 2015-09-10 DIAGNOSIS — R112 Nausea with vomiting, unspecified: Secondary | ICD-10-CM

## 2015-09-10 DIAGNOSIS — Z7189 Other specified counseling: Secondary | ICD-10-CM

## 2015-09-10 LAB — CBC
HEMATOCRIT: 35.6 % — AB (ref 39.0–52.0)
HEMOGLOBIN: 11.3 g/dL — AB (ref 13.0–17.0)
MCH: 33.5 pg (ref 26.0–34.0)
MCHC: 31.7 g/dL (ref 30.0–36.0)
MCV: 105.6 fL — ABNORMAL HIGH (ref 78.0–100.0)
Platelets: 97 10*3/uL — ABNORMAL LOW (ref 150–400)
RBC: 3.37 MIL/uL — ABNORMAL LOW (ref 4.22–5.81)
RDW: 17.3 % — AB (ref 11.5–15.5)
WBC: 6.5 10*3/uL (ref 4.0–10.5)

## 2015-09-10 LAB — BASIC METABOLIC PANEL
ANION GAP: 14 (ref 5–15)
BUN: 28 mg/dL — ABNORMAL HIGH (ref 6–20)
CALCIUM: 8.5 mg/dL — AB (ref 8.9–10.3)
CHLORIDE: 93 mmol/L — AB (ref 101–111)
CO2: 28 mmol/L (ref 22–32)
Creatinine, Ser: 3.91 mg/dL — ABNORMAL HIGH (ref 0.61–1.24)
GFR calc non Af Amer: 13 mL/min — ABNORMAL LOW (ref >60)
GFR, EST AFRICAN AMERICAN: 15 mL/min — AB (ref >60)
Glucose, Bld: 91 mg/dL (ref 65–99)
Potassium: 4.3 mmol/L (ref 3.5–5.1)
Sodium: 135 mmol/L (ref 135–145)

## 2015-09-10 MED ORDER — ONDANSETRON HCL 4 MG PO TABS
4.0000 mg | ORAL_TABLET | Freq: Three times a day (TID) | ORAL | Status: DC | PRN
  Administered 2015-09-10: 4 mg via ORAL
  Filled 2015-09-10: qty 1

## 2015-09-10 NOTE — Progress Notes (Signed)
Daily Progress Note   Patient Name: [name]       Date: 09/10/2015 DOB: [birth year]  Age: 82 y.o. MRN#: [id number] Attending Physician: Cristal Ford, DO Primary Care Physician: Angelina Sheriff., MD Admit Date: 08/28/2015  Reason for Consultation/Follow-up: Establishing goals of care  Subjective:  resting in bed, discussed with wife over the phone, see below:  Length of Stay: 13  Current Medications: Scheduled Meds:  . allopurinol  100 mg Oral Daily  . antiseptic oral rinse  7 mL Mouth Rinse q12n4p  . calcium carbonate  1 tablet Oral TID WC  . cefUROXime  500 mg Oral QPC supper  . chlorhexidine  15 mL Mouth Rinse BID  . darbepoetin (ARANESP) injection - DIALYSIS  60 mcg Intravenous Q Wed-HD  . feeding supplement  1 Container Oral TID WC  . feeding supplement (PRO-STAT SUGAR FREE 64)  30 mL Oral BID  . folic acid  1 mg Oral Daily  . indomethacin  75 mg Oral Q breakfast  . lamoTRIgine  100 mg Oral QHS  . lamoTRIgine  50 mg Oral Daily  . latanoprost  1 drop Both Eyes QHS  . levothyroxine  75 mcg Oral QAC breakfast  . midodrine  10 mg Oral Q6H  . QUEtiapine  12.5 mg Oral QHS  . senna-docusate  1 tablet Oral BID  . sertraline  25 mg Oral Daily  . simvastatin  20 mg Oral QHS  . timolol  1 drop Both Eyes Daily    Continuous Infusions:    PRN Meds: ondansetron  Physical Exam         Weak elderly gentleman Does not interact with me much S1 S2 Diminished Abdomen soft  Vital Signs: BP 136/60 (BP Location: Right Arm)   Pulse (!) 56   Temp 97.3 F (36.3 C) (Oral)   Resp 16   Ht 5\' 7"  (1.702 m)   Wt 70.3 kg (154 lb 14.4 oz)   SpO2 98%   BMI 24.26 kg/m  SpO2: SpO2: 98 % O2 Device: O2 Device: Not Delivered O2 Flow Rate: O2 Flow Rate (L/min): 2  L/min  Intake/output summary:  Intake/Output Summary (Last 24 hours) at 09/10/15 1512 Last data filed at 09/10/15 0900  Gross per 24 hour  Intake  220 ml  Output                0 ml  Net              220 ml   LBM: Last BM Date: 09/10/15 Baseline Weight: Weight: 64.4 kg (142 lb) Most recent weight: Weight: 70.3 kg (154 lb 14.4 oz)       Palliative Assessment/Data:    Flowsheet Rows   Flowsheet Row Most Recent Value  Intake Tab  Referral Department  Hospitalist  Unit at Time of Referral  Med/Surg Unit  Palliative Care Primary Diagnosis  Nephrology  Date Notified  09/08/15  Palliative Care Type  New Palliative care  Reason for referral  Clarify Goals of Care  Date of Admission  08/28/15  Date first seen by Palliative Care  09/08/15  # of days Palliative referral response time  0 Day(s)  # of days IP prior to Palliative referral  11  Clinical Assessment  Palliative Performance Scale Score  30%  Pain Max last 24 hours  6  Pain Min Last 24 hours  5  Dyspnea Max Last 24 Hours  5  Dyspnea Min Last 24 hours  4  Nausea Max Last 24 Hours  5  Nausea Min Last 24 Hours  4  Psychosocial & Spiritual Assessment  Palliative Care Outcomes  Patient/Family meeting held?  Yes  Who was at the meeting?  wife daughter   Palliative Care Outcomes  Clarified goals of care  Palliative Care follow-up planned  Yes, Facility      Patient Active Problem List   Diagnosis Date Noted  . Pressure ulcer 09/09/2015  . Encounter for palliative care   . Bacteremia   . PAF (paroxysmal atrial fibrillation) ([place])   . Dementia   . Macrocytic anemia   . Thrombocytopenia ([place])   . Debility   . Chronic renal failure   . Goals of care, counseling/discussion   . Healthcare-associated pneumonia   . Hypotension 08/28/2015  . Dyspnea 02/21/2015  . Pleural effusion 02/14/2015  . Poor dentition 01/18/2015  . Pleural effusion on left 01/17/2015  . Hypotension, unspecified 04/02/2013  . Fever,  unspecified 04/02/2013  . Acute respiratory failure ([place]) 04/02/2013  . CAP (community acquired pneumonia) 04/02/2013  . Seizures ([place]) 04/02/2013  . Sepsis ([place]) 04/02/2013  . Encounter for long-term (current) use of other medications   . Hereditary and idiopathic peripheral neuropathy   . Unspecified transient cerebral ischemia   . Polyneuropathy in other diseases classified elsewhere ([place])   . External hemorrhoids 10/30/2010  . HYPOTHYROIDISM 08/14/2009  . HYPERPARATHYROIDISM, SECONDARY 08/14/2009  . HYPERLIPIDEMIA 08/14/2009  . GOUT, UNSPECIFIED 08/14/2009  . ANEMIA OF CHRONIC DISEASE 08/14/2009  . HYPERTENSION 08/14/2009  . Aortic valve disorder 08/14/2009  . HEMORRHOIDS, INTERNAL 08/14/2009  . HIATAL HERNIA 08/14/2009  . DIVERTICULOSIS, COLON 08/14/2009  . PROCTITIS 08/14/2009  . ESRD on dialysis ([place]) 08/14/2009  . COLONIC POLYPS, ADENOMATOUS, HX OF 08/14/2009  . BENIGN PROSTATIC HYPERTROPHY, HX OF 08/14/2009    Palliative Care Assessment & Plan   Patient Profile:  82yo male with hx of AoS, Afib, ESRD, HTN, and Guillain-Barre who presented 7/31 with c/o weakness, malaise and a fall at home. In the ER he had progressive hypotension despite volume and chronic R sided effusion (previously thought ?malignant) with possible infiltrate on CXR. PCCM admitted the pt to the ICU., Workup was significant for Escherichia coli bacteremia, and right lower lobe pneumonia which was treated, patient remained with  significant weakness and debilitation, plan to discharge to SNF when he can tolerate hemodialysis in a chair.   Assessment:  nausea vomiting Recent PNA Recent bacteremia Chronic bilateral pleural effusions ESRD on HD History of GBS Gout, pain in knee recently.   Recommendations/Plan:   call placed and discussed with wife:  She states that the patient recognized and interacted with their son earlier today. She is aware that he had vomiting and is now back on clear  liquids. For now, she wants patient to continue dialysis and go to SNF [facility] on d/c. She wants to "wait and see" how he does, she also wishes to discuss again with Dr Jonnie Finner. Offered active listening and supportive care.  Recommend palliative services follow up with patient at his SNF on d/c.    Code Status:    Code Status Orders        Start     Ordered   08/28/15 1209  Limited resuscitation (code)  Continuous    Question Answer Comment  In the event of cardiac or respiratory ARREST: Initiate Code Blue, Call Rapid Response No   In the event of cardiac or respiratory ARREST: Perform CPR No   In the event of cardiac or respiratory ARREST: Perform Intubation/Mechanical Ventilation No   In the event of cardiac or respiratory ARREST: Use NIPPV/BiPAp only if indicated Yes   In the event of cardiac or respiratory ARREST: Administer ACLS medications if indicated Yes   In the event of cardiac or respiratory ARREST: Perform Defibrillation or Cardioversion if indicated No      08/28/15 1211    Code Status History    Date Active Date Inactive Code Status Order ID Comments User Context   04/02/2013 11:06 PM 04/06/2013  8:30 PM Full Code [id number]  Mariea Clonts, MD Inpatient       Prognosis:   Unable to determine  Discharge Planning:  [facility] for rehab with Palliative care service follow-up  Care plan was discussed with  Wife Mrs Odette over the phone.   Thank you for allowing the Palliative Medicine Team to assist in the care of this patient.   Time In:  1400 Time Out: 1425 Total Time 25 Prolonged Time Billed  no       Greater than 50%  of this time was spent counseling and coordinating care related to the above assessment and plan.  Loistine Chance, MD [phone] Please contact Palliative Medicine Team phone at [phone] for questions and concerns.

## 2015-09-10 NOTE — Progress Notes (Signed)
PROGRESS NOTE    [name]  FGB:[id number] DOB: April 03, 1926 DOA: 08/28/2015 PCP: [name]., MD   Chief Complaint  Patient presents with  . Fall     Brief Narrative:  82yo male with hx of AoS, Afib, ESRD, HTN, and Guillain-Barre who presented 7/31 with c/o weakness, malaise and a fall at home. In the ER he had progressive hypotension despite volume and chronic R sided effusion (previously thought ?malignant) with possible infiltrate on CXR. PCCM admitted the pt to the ICU., Workup was significant for Escherichia coli bacteremia, and right lower lobe pneumonia which was treated, patient remained with significant weakness and debilitation, plan to discharge to SNF when he can tolerate hemodialysis in a chair.  Assessment & Plan  Nausea and vomiting -Patient able to tolerate liquids and was placed on dysphagia 3 diet -witnessed active vomiting after eating a few bites of eggs -speech consulted -will place back on clear liquid diet  RLL PNA  -Completed a 5 day course of empiric abx   E. coli bacteremia -Source not clear - ?Ecoli PNA  -plan to complete a 14 day course of antibiotic therapy for this indication  -transitioned to oral Ceftin, to finish total of 14 days.  Chronic bilateral pleural effusion, left more than right -had thoracentesis 02/14/2015 X 1 liter w/ glucose 106 WBC 160 with L > P, cyt POS atypical cells  -has been evaluated by PCCM - signif L sided effusion noted on f/u CXR 8/5 AM, progressing since prior CXRs  -clinically stable at present, no respiratory distress or hypoxia  -can be followed as an outpatient with Dr. Melvyn Novas.  Hypotension - acute on chronic  -Sepsis w/ bacteremia on baseline of chronic hypotension  -now off pressors  -Continue midodrine -blood pressure stable   Chronic AFib -not on anticoagulation - heart rate well controlled  ESRD on MWF HD -Nephrology following - continues dialysis per usual schedule  Macrocytic  anemia -E02 and folic acid levels normal/high - hemoglobin stable, currently 11.3  Hypokalemia  -Corrected w/ HD   Elevated alk phos / T bil -on a downward trend - no clear etiology - potentially related to chronic right pleural effusion  Thrombocytopenia  -Likely due to gram-negative bacteremia -Platelets 97 today  Hypothyroid  -Continue home synthroid  Hx seizures  -Continue home lamictal   History [name]  Gout -Swollen right knee, Xray showed moderate suprapatellar effusion -Spoke with orthopedics, Dr. Sharol Given, via phone, who recommended uric acid level and colchicine, outpatient follow up in a few weeks. -continue allopurinol and indocin   Goals of care -Spoke with Dr. Jonnie Finner who is more familiar with the patient than myself. He feels the patient has become more debilitated given his recent illnesses.  -Patient currently is a partial code, no CPR or intubation. -Palliative care consulted and appreciated, possibly withdrawing or discontinuing dialysis, comfort measures?   DVT Prophylaxis  SCDs  Code Status: Partial Code, No CPR/Intubation/Defib. Only BiPAP/ACLS meds  Family Communication: none at bedside  Disposition Plan: Admitted.   Consultants Nephrology PCCM  Procedures  None  Antibiotics   Anti-infectives    Start     Dose/Rate Route Frequency Ordered Stop   09/04/15 1800  cefUROXime (CEFTIN) tablet 500 mg    Comments:  Pharmacy may adjust dose as indicated   500 mg Oral Daily after supper 09/04/15 1414 09/11/15 1759   09/04/15 0700  cefUROXime (CEFTIN) tablet 500 mg  Status:  Discontinued    Comments:  Pharmacy may adjust  dose as indicated   500 mg Oral 2 times daily with meals 09/03/15 1157 09/04/15 1414   08/29/15 1800  cefTRIAXone (ROCEPHIN) 1 g in dextrose 5 % 50 mL IVPB  Status:  Discontinued     1 g 100 mL/hr over 30 Minutes Intravenous Every 24 hours 08/28/15 1221 08/29/15 0340   08/29/15 1800  cefTRIAXone (ROCEPHIN) 2 g in  dextrose 5 % 50 mL IVPB     2 g 100 mL/hr over 30 Minutes Intravenous Every 24 hours 08/29/15 0340 09/03/15 1806   08/28/15 1215  azithromycin (ZITHROMAX) 500 mg in dextrose 5 % 250 mL IVPB     500 mg 250 mL/hr over 60 Minutes Intravenous Every 24 hours 08/28/15 1212 09/01/15 1407   08/28/15 1200  vancomycin (VANCOCIN) 1,500 mg in sodium chloride 0.9 % 500 mL IVPB  Status:  Discontinued     1,500 mg 250 mL/hr over 120 Minutes Intravenous  Once 08/28/15 1146 08/28/15 1212   08/28/15 1130  ceFEPIme (MAXIPIME) 2 g in dextrose 5 % 50 mL IVPB  Status:  Discontinued     2 g 100 mL/hr over 30 Minutes Intravenous  Once 08/28/15 1129 08/28/15 1212   08/28/15 1130  vancomycin (VANCOCIN) IVPB 1000 mg/200 mL premix  Status:  Discontinued     1,000 mg 200 mL/hr over 60 Minutes Intravenous  Once 08/28/15 1129 08/28/15 1146      Subjective:   [name] seen and examined today.  Patient had no compliants today, however after eating a few bites of egg, he started to vomit.  Denies chest pain, shortness of breath, abdominal pain.   Objective:   Vitals:   09/09/15 1721 09/09/15 2137 09/10/15 0631 09/10/15 0900  BP: 120/61 115/72 119/61 136/60  Pulse: 64 65 (!) 58 (!) 56  Resp: 17 18 16 16   Temp: 97.5 F (36.4 C) 97.4 F (36.3 C) 97.3 F (36.3 C) 97.3 F (36.3 C)  TempSrc: Oral Oral Oral Oral  SpO2: 95% 99% 97% 98%  Weight:      Height:        Intake/Output Summary (Last 24 hours) at 09/10/15 1118 Last data filed at 09/10/15 0900  Gross per 24 hour  Intake              460 ml  Output                0 ml  Net              460 ml   Filed Weights   09/08/15 0825 09/08/15 1239 09/08/15 2054  Weight: 67.1 kg (147 lb 14.9 oz) 65.1 kg (143 lb 8.3 oz) 70.3 kg (154 lb 14.4 oz)    Exam  General: Well developed, chronically ill appearing, NAD  HEENT: NCAT,mucous membranes moist.   Cardiovascular: S1 S2 auscultated, 2/6 SEM, irregular  Respiratory: Diminished breath sounds  Abdomen:  Soft, nontender, nondistended, + bowel sounds  Extremities: warm dry without cyanosis clubbing. Trace ankle edema  Neuro: AAOx3, nonfocal  Psych: Appropriate mood and affect   Data Reviewed: I have personally reviewed following labs and imaging studies  CBC:  Recent Labs Lab 09/04/15 0705 09/06/15 0800 09/08/15 0451 09/09/15 0336 09/10/15 0526  WBC 7.0 7.5 6.3 6.1 6.5  HGB 10.7* 10.9* 10.7* 10.6* 11.3*  HCT 33.1* 34.1* 33.2* 32.8* 35.6*  MCV 104.4* 105.2* 105.4* 104.8* 105.6*  PLT 87* 110* 103* 106* 97*   Basic Metabolic Panel:  Recent Labs Lab 09/04/15 0705 09/06/15  0800 09/08/15 0451 09/09/15 0336 09/10/15 0526  NA 132* 133* 134* 136 135  K 4.2 4.6 4.6 3.9 4.3  CL 93* 95* 94* 95* 93*  CO2 28 26 28 29 28   GLUCOSE 79 85 80 74 91  BUN 34* 36* 31* 16 28*  CREATININE 4.80* 4.80* 4.26* 2.86* 3.91*  CALCIUM 9.0 8.7* 8.7* 8.1* 8.5*  PHOS 3.7  --   --   --   --    GFR: Estimated Creatinine Clearance: 12.7 mL/min (by C-G formula based on SCr of 3.91 mg/dL). Liver Function Tests:  Recent Labs Lab 09/04/15 0705  ALBUMIN 2.5*   No results for input(s): LIPASE, AMYLASE in the last 168 hours. No results for input(s): AMMONIA in the last 168 hours. Coagulation Profile: No results for input(s): INR, PROTIME in the last 168 hours. Cardiac Enzymes: No results for input(s): CKTOTAL, CKMB, CKMBINDEX, TROPONINI in the last 168 hours. BNP (last 3 results)  Recent Labs  02/21/15 1413  PROBNP 1,353.0*   HbA1C: No results for input(s): HGBA1C in the last 72 hours. CBG: No results for input(s): GLUCAP in the last 168 hours. Lipid Profile: No results for input(s): CHOL, HDL, LDLCALC, TRIG, CHOLHDL, LDLDIRECT in the last 72 hours. Thyroid Function Tests: No results for input(s): TSH, T4TOTAL, FREET4, T3FREE, THYROIDAB in the last 72 hours. Anemia Panel: No results for input(s): VITAMINB12, FOLATE, FERRITIN, TIBC, IRON, RETICCTPCT in the last 72 hours. Urine  analysis: No results found for: COLORURINE, APPEARANCEUR, LABSPEC, PHURINE, GLUCOSEU, HGBUR, BILIRUBINUR, KETONESUR, PROTEINUR, UROBILINOGEN, NITRITE, LEUKOCYTESUR Sepsis Labs: @LABRCNTIP (procalcitonin:4,lacticidven:4)  ) No results found for this or any previous visit (from the past 240 hour(s)).    Radiology Studies: No results found.   Scheduled Meds: . allopurinol  100 mg Oral Daily  . antiseptic oral rinse  7 mL Mouth Rinse q12n4p  . calcium carbonate  1 tablet Oral TID WC  . cefUROXime  500 mg Oral QPC supper  . chlorhexidine  15 mL Mouth Rinse BID  . darbepoetin (ARANESP) injection - DIALYSIS  60 mcg Intravenous Q Wed-HD  . feeding supplement  1 Container Oral TID WC  . feeding supplement (PRO-STAT SUGAR FREE 64)  30 mL Oral BID  . folic acid  1 mg Oral Daily  . indomethacin  75 mg Oral Q breakfast  . lamoTRIgine  100 mg Oral QHS  . lamoTRIgine  50 mg Oral Daily  . latanoprost  1 drop Both Eyes QHS  . levothyroxine  75 mcg Oral QAC breakfast  . midodrine  10 mg Oral Q6H  . QUEtiapine  12.5 mg Oral QHS  . senna-docusate  1 tablet Oral BID  . sertraline  25 mg Oral Daily  . simvastatin  20 mg Oral QHS  . timolol  1 drop Both Eyes Daily   Continuous Infusions:    LOS: 13 days   Time Spent in minutes   30 minutes  Rosalie Buenaventura D.O. on 09/10/2015 at 11:18 AM  Between 7am to 7pm - Pager - [phone]  After 7pm go to [email or web address] - password [email or web address]  And look for the night coverage person covering for me after hours  [facility] Office  [phone]

## 2015-09-10 NOTE — Care Management (Signed)
Palliative Care and CSW  met with family yesterday to establish goals of care  Patient remains weak and debilitated. CM spoke with Dr. Ree Kida concerning plan, family still undecided on SNF vs Hospice care. awaiting Palliative Team family meeting today.

## 2015-09-10 NOTE — Progress Notes (Signed)
[facility] Progress Note   Subjective: pall care met w family yesterday.  Pt not interacting much.    Objective Vitals:   09/09/15 1721 09/09/15 2137 09/10/15 0631 09/10/15 0900  BP: 120/61 115/72 119/61 136/60  Pulse: 64 65 (!) 58 (!) 56  Resp: _0 Temp: 97.5 F (36.4 C) 97.4 F (36.3 C) 97.3 F (36.3 C) 97.3 F (36.3 C)  TempSrc: Oral Oral Oral Oral  SpO2: 95% 99% 97% 98%  Weight:      Height:       Physical Exam General: frail, elderly male, NAD  Heart: A0,T6, 2/6 systolic M.  Lungs: BBS CTA anteriorly, dec in bases.  Abdomen: soft nontender, active bs. Extremities: trace ankle edema bilaterally.  Dialysis Access: LFA AVF cannulated at present  Dialysis:  [facility] MWF 3h 24mn 64kg 2/2.25 bath Heparin none L arm AVF  Venofer 50 weekly, mircera 75 q 2 weeks- given 7/19  Last hgb 10/4 On TUMS for binding and calcitriol 0.25- last ca=9.7, phos 3.6, PTH 77   Assessment:  1. RLL PNA: completed antibiotic Rx per primary 2. E. coli bacteremia: poss PNA. Has been placed on ceftin for 14 days per primary.  3. Hypotension/volume: R/T sepsis. On Midodrine 10 mg PO q 6 hours.  4. AMS: remains confused 5. ESRD -MWF HD 6. Anemia - HGB 10.7.  7. Secondary hyperparathyroidism - Ca 8.7 C Ca 9.9 TUMS on hold. Cont VDRA.  8. Nutrition -Last albumin 2.5. Dysphagia III diet. Add prostat/renal vit. 9. Chronic Afib: not on anticoagulant. Per primary 10. Thrombocytopenia: PLT 86 on adm. Now 103. Per primary 11. EOL - partial DNR. Poor prognosis, severely debilitated.  Pall care meeting w family, they are leaning towards comfort care.     Plan - HD Monday  RKelly SplinterMD [facility] pager [phone]   cell [phone]/13/2017, 10:54 AM       Additional Objective Labs: Basic Metabolic Panel:  Recent Labs Lab 09/04/15 0705  09/08/15 0451 09/09/15 0336 09/10/15 0526  NA 132*  < > 134* 136 135  K 4.2  < > 4.6 3.9 4.3  CL 93*   < > 94* 95* 93*  CO2 28  < > _1 GLUCOSE 79  < > 80 74 91  BUN 34*  < > 31* 16 28*  CREATININE 4.80*  < > 4.26* 2.86* 3.91*  CALCIUM 9.0  < > 8.7* 8.1* 8.5*  PHOS 3.7  --   --   --   --   < > = values in this interval not displayed. Liver Function Tests:  Recent Labs Lab 09/04/15 0705  ALBUMIN 2.5*   No results for input(s): LIPASE, AMYLASE in the last 168 hours. CBC:  Recent Labs Lab 09/04/15 0705 09/06/15 0800 09/08/15 0451 09/09/15 0336 09/10/15 0526  WBC 7.0 7.5 6.3 6.1 6.5  HGB 10.7* 10.9* 10.7* 10.6* 11.3*  HCT 33.1* 34.1* 33.2* 32.8* 35.6*  MCV 104.4* 105.2* 105.4* 104.8* 105.6*  PLT 87* 110* 103* 106* 97*   Blood Culture    Component Value Date/Time   SDES BLOOD RIGHT HAND 08/28/2015 0830   SPECREQUEST BOTTLES DRAWN AEROBIC AND ANAEROBIC  5CC 08/28/2015 0830   CULT ESCHERICHIA COLI (A) 08/28/2015 0830   REPTSTATUS 09/01/2015 FINAL 08/28/2015 0830    Cardiac Enzymes: No results for input(s): CKTOTAL, CKMB, CKMBINDEX, TROPONINI in the last 168 hours. CBG: No results for input(s): GLUCAP in the last 168 hours. Iron  Studies: No results for input(s): IRON, TIBC, TRANSFERRIN, FERRITIN in the last 72 hours. _0 @ Studies/Results: No results found. Medications:   . allopurinol  100 mg Oral Daily  . antiseptic oral rinse  7 mL Mouth Rinse q12n4p  . calcium carbonate  1 tablet Oral TID WC  . cefUROXime  500 mg Oral QPC supper  . chlorhexidine  15 mL Mouth Rinse BID  . darbepoetin (ARANESP) injection - DIALYSIS  60 mcg Intravenous Q Wed-HD  . feeding supplement  1 Container Oral TID WC  . feeding supplement (PRO-STAT SUGAR FREE 64)  30 mL Oral BID  . folic acid  1 mg Oral Daily  . indomethacin  75 mg Oral Q breakfast  . lamoTRIgine  100 mg Oral QHS  . lamoTRIgine  50 mg Oral Daily  . latanoprost  1 drop Both Eyes QHS  . levothyroxine  75 mcg Oral QAC breakfast  . midodrine  10 mg Oral Q6H  . QUEtiapine  12.5 mg Oral QHS  . senna-docusate  1  tablet Oral BID  . sertraline  25 mg Oral Daily  . simvastatin  20 mg Oral QHS  . timolol  1 drop Both Eyes Daily

## 2015-09-10 NOTE — Progress Notes (Signed)
Spoke with pt's wife tonight, she is concerned that he will not do well in rehab and that HD is too much for him at this point. We discussed options and decision was made for comfort care, no further dialysis.  She would like him to be placed in hospice in [place].  Have d/w primary MD and d/w patient and questions answered.    Kelly Splinter MD [employer] pager [phone]    cell [phone] 09/10/2015, 8:38 PM

## 2015-09-10 NOTE — Progress Notes (Signed)
CSW confirmed with wife that she would like pt to go to [name] at d/c.  CSW will continue to follow for disposition.  Creta Levin, LCSW Weekend Coverage [id number]

## 2015-09-11 DIAGNOSIS — R7989 Other specified abnormal findings of blood chemistry: Secondary | ICD-10-CM

## 2015-09-11 DIAGNOSIS — R945 Abnormal results of liver function studies: Secondary | ICD-10-CM

## 2015-09-11 DIAGNOSIS — M109 Gout, unspecified: Secondary | ICD-10-CM

## 2015-09-11 DIAGNOSIS — R52 Pain, unspecified: Secondary | ICD-10-CM

## 2015-09-11 LAB — CBC
HEMATOCRIT: 34.5 % — AB (ref 39.0–52.0)
HEMOGLOBIN: 10.9 g/dL — AB (ref 13.0–17.0)
MCH: 33.6 pg (ref 26.0–34.0)
MCHC: 31.6 g/dL (ref 30.0–36.0)
MCV: 106.5 fL — AB (ref 78.0–100.0)
Platelets: 95 10*3/uL — ABNORMAL LOW (ref 150–400)
RBC: 3.24 MIL/uL — AB (ref 4.22–5.81)
RDW: 17 % — ABNORMAL HIGH (ref 11.5–15.5)
WBC: 5.2 10*3/uL (ref 4.0–10.5)

## 2015-09-11 LAB — BASIC METABOLIC PANEL
ANION GAP: 12 (ref 5–15)
BUN: 37 mg/dL — ABNORMAL HIGH (ref 6–20)
CHLORIDE: 95 mmol/L — AB (ref 101–111)
CO2: 28 mmol/L (ref 22–32)
Calcium: 8.4 mg/dL — ABNORMAL LOW (ref 8.9–10.3)
Creatinine, Ser: 4.54 mg/dL — ABNORMAL HIGH (ref 0.61–1.24)
GFR calc non Af Amer: 11 mL/min — ABNORMAL LOW (ref >60)
GFR, EST AFRICAN AMERICAN: 12 mL/min — AB (ref >60)
GLUCOSE: 83 mg/dL (ref 65–99)
POTASSIUM: 4 mmol/L (ref 3.5–5.1)
Sodium: 135 mmol/L (ref 135–145)

## 2015-09-11 NOTE — Care Management Important Message (Signed)
Important Message  Patient Details  Name: GREGORI OKELLEY MRN: [id number] Date of Birth: [birth year]   Medicare Important Message Given:  Yes    Mitsuye Schrodt [address] 09/11/2015, 11:57 AM

## 2015-09-11 NOTE — Clinical Social Work Note (Signed)
Consult received re: Hospice placement as patient is now comfort care and wife's preference is [facility]. Referral made with hospice facility and CSW later advised by hospice staff person that patient no hospice beds available. [name] and [name] [phone]) advised and discharge options discussed. [name] in agreement with SNF placement (private pay), however son interested in patient discharging home with [facility] services and private pay assistance from a [facility] agency. Son talked with nurse case manager and was provided with [facility] agency options. CSW will f/u with patient's wife and son regarding discharge decision.  Amon Costilla Givens, MSW, LCSW Licensed Clinical Social Worker [place] [phone]

## 2015-09-11 NOTE — Progress Notes (Signed)
Daily Progress Note   Patient Name: [name]       Date: 09/11/2015 DOB: 1926-04-14  Age: 82 y.o. MRN#: [id number] Attending Physician: Cristal Ford, DO Primary Care Physician: Angelina Sheriff., MD Admit Date: 08/28/2015  Reason for Consultation/Follow-up: Establishing goals of care  Subjective:  resting in bed, discussed with wife over the phone, see below:  Length of Stay: 14  Current Medications: Scheduled Meds:  . allopurinol  100 mg Oral Daily  . antiseptic oral rinse  7 mL Mouth Rinse q12n4p  . calcium carbonate  1 tablet Oral TID WC  . cefUROXime  500 mg Oral QPC supper  . chlorhexidine  15 mL Mouth Rinse BID  . darbepoetin (ARANESP) injection - DIALYSIS  60 mcg Intravenous Q Wed-HD  . feeding supplement  1 Container Oral TID WC  . feeding supplement (PRO-STAT SUGAR FREE 64)  30 mL Oral BID  . folic acid  1 mg Oral Daily  . indomethacin  75 mg Oral Q breakfast  . lamoTRIgine  100 mg Oral QHS  . lamoTRIgine  50 mg Oral Daily  . latanoprost  1 drop Both Eyes QHS  . levothyroxine  75 mcg Oral QAC breakfast  . midodrine  10 mg Oral Q6H  . QUEtiapine  12.5 mg Oral QHS  . senna-docusate  1 tablet Oral BID  . sertraline  25 mg Oral Daily  . simvastatin  20 mg Oral QHS  . timolol  1 drop Both Eyes Daily    Continuous Infusions:    PRN Meds: ondansetron  Physical Exam         Weak elderly gentleman Does not interact with me much S1 S2 Diminished Abdomen soft  Vital Signs: BP 124/75 (BP Location: Right Arm)   Pulse 81   Temp 98.4 F (36.9 C) (Oral)   Resp 18   Ht 5\' 7"  (1.702 m)   Wt 66.6 kg (146 lb 13.2 oz)   SpO2 97%   BMI 23.00 kg/m  SpO2: SpO2: 97 % O2 Device: O2 Device: Not Delivered O2 Flow Rate: O2 Flow Rate (L/min): 2 L/min  Intake/output  summary:   Intake/Output Summary (Last 24 hours) at 09/11/15 0935 Last data filed at 09/11/15 0807  Gross per 24 hour  Intake  1075 ml  Output                0 ml  Net             1075 ml   LBM: Last BM Date: 09/10/15 Baseline Weight: Weight: 64.4 kg (142 lb) Most recent weight: Weight: 66.6 kg (146 lb 13.2 oz)       Palliative Assessment/Data:    Flowsheet Rows   Flowsheet Row Most Recent Value  Intake Tab  Referral Department  Hospitalist  Unit at Time of Referral  Med/Surg Unit  Palliative Care Primary Diagnosis  Nephrology  Date Notified  09/08/15  Palliative Care Type  New Palliative care  Reason for referral  Clarify Goals of Care  Date of Admission  08/28/15  Date first seen by Palliative Care  09/08/15  # of days Palliative referral response time  0 Day(s)  # of days IP prior to Palliative referral  11  Clinical Assessment  Palliative Performance Scale Score  30%  Pain Max last 24 hours  6  Pain Min Last 24 hours  5  Dyspnea Max Last 24 Hours  5  Dyspnea Min Last 24 hours  4  Nausea Max Last 24 Hours  5  Nausea Min Last 24 Hours  4  Psychosocial & Spiritual Assessment  Palliative Care Outcomes  Patient/Family meeting held?  Yes  Who was at the meeting?  wife daughter   Palliative Care Outcomes  Clarified goals of care  Palliative Care follow-up planned  Yes, Facility      Patient Active Problem List   Diagnosis Date Noted  . Pressure ulcer 09/09/2015  . Encounter for palliative care   . Bacteremia   . PAF (paroxysmal atrial fibrillation) ([place])   . Dementia   . Macrocytic anemia   . Thrombocytopenia ([place])   . Debility   . Chronic renal failure   . Goals of care, counseling/discussion   . Healthcare-associated pneumonia   . Hypotension 08/28/2015  . Dyspnea 02/21/2015  . Pleural effusion 02/14/2015  . Poor dentition 01/18/2015  . Pleural effusion on left 01/17/2015  . Hypotension, unspecified 04/02/2013  . Fever, unspecified  04/02/2013  . Acute respiratory failure ([place]) 04/02/2013  . CAP (community acquired pneumonia) 04/02/2013  . Seizures ([place]) 04/02/2013  . Sepsis ([place]) 04/02/2013  . Encounter for long-term (current) use of other medications   . Hereditary and idiopathic peripheral neuropathy   . Unspecified transient cerebral ischemia   . Polyneuropathy in other diseases classified elsewhere ([place])   . External hemorrhoids 10/30/2010  . HYPOTHYROIDISM 08/14/2009  . HYPERPARATHYROIDISM, SECONDARY 08/14/2009  . HYPERLIPIDEMIA 08/14/2009  . GOUT, UNSPECIFIED 08/14/2009  . ANEMIA OF CHRONIC DISEASE 08/14/2009  . HYPERTENSION 08/14/2009  . Aortic valve disorder 08/14/2009  . HEMORRHOIDS, INTERNAL 08/14/2009  . HIATAL HERNIA 08/14/2009  . DIVERTICULOSIS, COLON 08/14/2009  . PROCTITIS 08/14/2009  . ESRD on dialysis ([place]) 08/14/2009  . COLONIC POLYPS, ADENOMATOUS, HX OF 08/14/2009  . BENIGN PROSTATIC HYPERTROPHY, HX OF 08/14/2009    Palliative Care Assessment & Plan   Patient Profile:  82yo male with hx of AoS, Afib, ESRD, HTN, and Guillain-Barre who presented 7/31 with c/o weakness, malaise and a fall at home. In the ER he had progressive hypotension despite volume and chronic R sided effusion (previously thought ?malignant) with possible infiltrate on CXR. PCCM admitted the pt to the ICU., Workup was significant for Escherichia coli bacteremia, and right lower lobe pneumonia which was treated, patient remained with significant  weakness and debilitation, plan to discharge to SNF when he can tolerate hemodialysis in a chair.   Assessment:  nausea vomiting Recent PNA Recent bacteremia Chronic bilateral pleural effusions ESRD on HD History of GBS Gout, pain in knee recently.   Recommendations/Plan:   call placed and discussed with wife:  She recalls her discussions with Dr Jonnie Finner on 09-10-15:  D/C Dialysis  Comfort car  Transfer to [facility]  wife states that her and their children have  always known that it would come to this: having to stop dialysis, focus on comfort, start hospice, but she states that it has still been a very hard decision for them to make. She states it has "hit them hard" but at the end of the day, they are ok with the decision to stop dialysis and send him to hospice today.    Code Status:    Code Status Orders        Start     Ordered   08/28/15 1209  Limited resuscitation (code)  Continuous    Question Answer Comment  In the event of cardiac or respiratory ARREST: Initiate Code Blue, Call Rapid Response No   In the event of cardiac or respiratory ARREST: Perform CPR No   In the event of cardiac or respiratory ARREST: Perform Intubation/Mechanical Ventilation No   In the event of cardiac or respiratory ARREST: Use NIPPV/BiPAp only if indicated Yes   In the event of cardiac or respiratory ARREST: Administer ACLS medications if indicated Yes   In the event of cardiac or respiratory ARREST: Perform Defibrillation or Cardioversion if indicated No      08/28/15 1211    Code Status History    Date Active Date Inactive Code Status Order ID Comments User Context   04/02/2013 11:06 PM 04/06/2013  8:30 PM Full Code [id number]  Mariea Clonts, MD Inpatient       Prognosis:  < 2 weeks   Discharge Planning:  Residential hospice, SW consult placed.   Care plan was discussed with  Wife Mrs Mica over the phone.   Thank you for allowing the Palliative Medicine Team to assist in the care of this patient.   Time In: 9 Time Out: 935 Total Time 35 Prolonged Time Billed  no       Greater than 50%  of this time was spent counseling and coordinating care related to the above assessment and plan.  Loistine Chance, MD [phone] Please contact Palliative Medicine Team phone at [phone] for questions and concerns.  Password [email or web address]. From 7AM to 7PM. Overnight, call the primary team.

## 2015-09-11 NOTE — Progress Notes (Signed)
PROGRESS NOTE    [name]  WUJ:[id number] DOB: 03-Aug-1926 DOA: 08/28/2015 PCP: Angelina Sheriff., MD   Chief Complaint  Patient presents with  . Fall     Brief Narrative:  82yo male with hx of AoS, Afib, ESRD, HTN, and Guillain-Barre who presented 7/31 with c/o weakness, malaise and a fall at home. In the ER he had progressive hypotension despite volume and chronic R sided effusion (previously thought ?malignant) with possible infiltrate on CXR. PCCM admitted the pt to the ICU., Workup was significant for Escherichia coli bacteremia, and right lower lobe pneumonia which was treated, patient remained with significant weakness and debilitation, plan to discharge to SNF when he can tolerate hemodialysis in a chair.  Despite treatment, patient has been declining.  Palliative care consulted.  Wife has agreed that patient can stop dialysis and be DNR.  She would like patient to go to [facility].  Assessment & Plan  Nausea and vomiting -Continue liquid diet as patient had witnessed vomiting of breakfast on 09/10/2015 -speech consulted  RLL PNA  -Completed a 5 day course of empiric abx   E. coli bacteremia -Source not clear - ?Ecoli PNA  -plan to complete a 14 day course of antibiotic therapy for this indication  -transitioned to oral Ceftin, to finish total of 14 days.  Chronic bilateral pleural effusion, left more than right -had thoracentesis 02/14/2015 X 1 liter w/ glucose 106 WBC 160 with L > P, cyt POS atypical cells  -has been evaluated by PCCM - signif L sided effusion noted on f/u CXR 8/5 AM, progressing since prior CXRs  -clinically stable at present, no respiratory distress or hypoxia  -can be followed as an outpatient with Dr. Melvyn Novas.  Hypotension - acute on chronic  -Sepsis w/ bacteremia on baseline of chronic hypotension  -now off pressors  -Continue midodrine -blood pressure stable   Chronic AFib -not on anticoagulation - heart rate well  controlled  ESRD on MWF HD -Nephrology following - continues dialysis per usual schedule  Macrocytic anemia -N56 and folic acid levels normal/high - hemoglobin stable, currently 10.9  Hypokalemia  -Corrected w/ HD   Elevated alk phos / T bil -on a downward trend - no clear etiology - potentially related to chronic right pleural effusion  Thrombocytopenia  -Likely due to gram-negative bacteremia -Platelets 95 today  Hypothyroid  -Continue home synthroid  Hx seizures  -Continue home lamictal   History [name]  Gout -Swollen right knee, Xray showed moderate suprapatellar effusion -Spoke with orthopedics, Dr. Sharol Given, via phone, who recommended uric acid level and colchicine, outpatient follow up in a few weeks. -continue allopurinol and indocin   Goals of care -Spoke with Dr. Jonnie Finner who is more familiar with the patient than myself. He feels the patient has become more debilitated given his recent illnesses.  -Patient currently is a partial code, no CPR or intubation. -Palliative care consulted and appreciated -Both Dr. Rowe Pavy and Dr. Jonnie Finner spoke with patient's wife via phone. She has agreed that patient should be DNR and to stop dialysis treatments. Would like [facility]  DVT Prophylaxis  SCDs  Code Status: DNR  Family Communication: none at bedside  Disposition Plan: Admitted. Pending Hospice  Consultants Nephrology PCCM Palliative care  Procedures  None  Antibiotics   Anti-infectives    Start     Dose/Rate Route Frequency Ordered Stop   09/04/15 1800  cefUROXime (CEFTIN) tablet 500 mg    Comments:  Pharmacy may adjust dose as indicated  500 mg Oral Daily after supper 09/04/15 1414 09/11/15 1759   09/04/15 0700  cefUROXime (CEFTIN) tablet 500 mg  Status:  Discontinued    Comments:  Pharmacy may adjust dose as indicated   500 mg Oral 2 times daily with meals 09/03/15 1157 09/04/15 1414   08/29/15 1800  cefTRIAXone (ROCEPHIN) 1 g in  dextrose 5 % 50 mL IVPB  Status:  Discontinued     1 g 100 mL/hr over 30 Minutes Intravenous Every 24 hours 08/28/15 1221 08/29/15 0340   08/29/15 1800  cefTRIAXone (ROCEPHIN) 2 g in dextrose 5 % 50 mL IVPB     2 g 100 mL/hr over 30 Minutes Intravenous Every 24 hours 08/29/15 0340 09/03/15 1806   08/28/15 1215  azithromycin (ZITHROMAX) 500 mg in dextrose 5 % 250 mL IVPB     500 mg 250 mL/hr over 60 Minutes Intravenous Every 24 hours 08/28/15 1212 09/01/15 1407   08/28/15 1200  vancomycin (VANCOCIN) 1,500 mg in sodium chloride 0.9 % 500 mL IVPB  Status:  Discontinued     1,500 mg 250 mL/hr over 120 Minutes Intravenous  Once 08/28/15 1146 08/28/15 1212   08/28/15 1130  ceFEPIme (MAXIPIME) 2 g in dextrose 5 % 50 mL IVPB  Status:  Discontinued     2 g 100 mL/hr over 30 Minutes Intravenous  Once 08/28/15 1129 08/28/15 1212   08/28/15 1130  vancomycin (VANCOCIN) IVPB 1000 mg/200 mL premix  Status:  Discontinued     1,000 mg 200 mL/hr over 60 Minutes Intravenous  Once 08/28/15 1129 08/28/15 1146      Subjective:   [name] seen and examined today.  Patient had no compliants today.  Feeling tired. Denies chest pain, shortness of breath, abdominal pain.   Objective:   Vitals:   09/10/15 1820 09/10/15 2131 09/11/15 0444 09/11/15 0806  BP: 135/70 105/61 (!) 88/56 124/75  Pulse: 64 77 92 81  Resp: _0 Temp: 97.7 F (36.5 C) 97.5 F (36.4 C) 97.6 F (36.4 C) 98.4 F (36.9 C)  TempSrc: Oral Oral Axillary Oral  SpO2: 98% 94% 93% 97%  Weight:  66.6 kg (146 lb 13.2 oz)    Height:        Intake/Output Summary (Last 24 hours) at 09/11/15 1144 Last data filed at 09/11/15 0807  Gross per 24 hour  Intake              955 ml  Output                0 ml  Net              955 ml   Filed Weights   09/08/15 1239 09/08/15 2054 09/10/15 2131  Weight: 65.1 kg (143 lb 8.3 oz) 70.3 kg (154 lb 14.4 oz) 66.6 kg (146 lb 13.2 oz)    Exam  General: Well developed, chronically ill  appearing, no distress  HEENT: NCAT,mucous membranes moist.   Cardiovascular: S1 S2 auscultated, 2/6 SEM, irregular  Respiratory: Diminished breath sounds  Abdomen: Soft, nontender, nondistended, + bowel sounds  Extremities: warm dry without cyanosis clubbing. Trace ankle edema  Neuro: AAOx3, nonfocal  Psych: Appropriate mood and affect   Data Reviewed: I have personally reviewed following labs and imaging studies  CBC:  Recent Labs Lab 09/06/15 0800 09/08/15 0451 09/09/15 0336 09/10/15 0526 09/11/15 0554  WBC 7.5 6.3 6.1 6.5 5.2  HGB 10.9* 10.7* 10.6* 11.3* 10.9*  HCT 34.1* 33.2* 32.8* 35.6*  34.5*  MCV 105.2* 105.4* 104.8* 105.6* 106.5*  PLT 110* 103* 106* 97* 95*   Basic Metabolic Panel:  Recent Labs Lab 09/06/15 0800 09/08/15 0451 09/09/15 0336 09/10/15 0526 09/11/15 0554  NA 133* 134* 136 135 135  K 4.6 4.6 3.9 4.3 4.0  CL 95* 94* 95* 93* 95*  CO2 _0 GLUCOSE 85 80 74 91 83  BUN 36* 31* 16 28* 37*  CREATININE 4.80* 4.26* 2.86* 3.91* 4.54*  CALCIUM 8.7* 8.7* 8.1* 8.5* 8.4*   GFR: Estimated Creatinine Clearance: 10.9 mL/min (by C-G formula based on SCr of 4.54 mg/dL). Liver Function Tests: No results for input(s): AST, ALT, ALKPHOS, BILITOT, PROT, ALBUMIN in the last 168 hours. No results for input(s): LIPASE, AMYLASE in the last 168 hours. No results for input(s): AMMONIA in the last 168 hours. Coagulation Profile: No results for input(s): INR, PROTIME in the last 168 hours. Cardiac Enzymes: No results for input(s): CKTOTAL, CKMB, CKMBINDEX, TROPONINI in the last 168 hours. BNP (last 3 results)  Recent Labs  02/21/15 1413  PROBNP 1,353.0*   HbA1C: No results for input(s): HGBA1C in the last 72 hours. CBG: No results for input(s): GLUCAP in the last 168 hours. Lipid Profile: No results for input(s): CHOL, HDL, LDLCALC, TRIG, CHOLHDL, LDLDIRECT in the last 72 hours. Thyroid Function Tests: No results for input(s): TSH, T4TOTAL,  FREET4, T3FREE, THYROIDAB in the last 72 hours. Anemia Panel: No results for input(s): VITAMINB12, FOLATE, FERRITIN, TIBC, IRON, RETICCTPCT in the last 72 hours. Urine analysis: No results found for: COLORURINE, APPEARANCEUR, LABSPEC, PHURINE, GLUCOSEU, HGBUR, BILIRUBINUR, KETONESUR, PROTEINUR, UROBILINOGEN, NITRITE, LEUKOCYTESUR Sepsis Labs: _1 (procalcitonin:4,lacticidven:4)  ) No results found for this or any previous visit (from the past 240 hour(s)).    Radiology Studies: No results found.   Scheduled Meds: . allopurinol  100 mg Oral Daily  . antiseptic oral rinse  7 mL Mouth Rinse q12n4p  . calcium carbonate  1 tablet Oral TID WC  . cefUROXime  500 mg Oral QPC supper  . chlorhexidine  15 mL Mouth Rinse BID  . darbepoetin (ARANESP) injection - DIALYSIS  60 mcg Intravenous Q Wed-HD  . feeding supplement  1 Container Oral TID WC  . feeding supplement (PRO-STAT SUGAR FREE 64)  30 mL Oral BID  . folic acid  1 mg Oral Daily  . indomethacin  75 mg Oral Q breakfast  . lamoTRIgine  100 mg Oral QHS  . lamoTRIgine  50 mg Oral Daily  . latanoprost  1 drop Both Eyes QHS  . levothyroxine  75 mcg Oral QAC breakfast  . midodrine  10 mg Oral Q6H  . QUEtiapine  12.5 mg Oral QHS  . senna-docusate  1 tablet Oral BID  . sertraline  25 mg Oral Daily  . simvastatin  20 mg Oral QHS  . timolol  1 drop Both Eyes Daily   Continuous Infusions:    LOS: 14 days   Time Spent in minutes   30 minutes  [name] D.O. on 09/11/2015 at 11:44 AM  Between 7am to 7pm - Pager - [phone]  After 7pm go to [email or web address] - password [email or web address]  And look for the night coverage person covering for me after hours  [facility] Office  [phone]

## 2015-09-11 NOTE — Consult Note (Signed)
[place] Nurse wound consult note Reason for Consult: Consult requested for left hip Wound type: Stage 2 Pressure Ulcer POA: No Measurement: 1.5X1.5X.1cm Wound bed: red and moist, scant amt blood-tinged drainage, no odor Periwound: loose peeling skin surrounding wound edges; appearance consistent with a previous blister which ruptured Dressing procedure/placement/frequency: Foam dressing to protect and promote healing.  No family members present to discuss plan of care. Please re-consult if further assistance is needed.  Thank-you,  Julien Girt MSN, [place], [place], [place], [place]

## 2015-09-11 NOTE — Progress Notes (Signed)
SLP Cancellation Note  Patient Details Name: GLENWOOD PENDELTON MRN: [id number] DOB: [birth year]   Cancelled treatment:       Reason Eval/Treat Not Completed: Medical issues which prohibited therapy. Discussed with MD. Patient transferring to hospice today, comfort care. Decision made to defer eval at this time.   [place], CCC-SLP [phone]    Gabriel Rainwater Meryl 09/11/2015, 11:33 AM

## 2015-09-11 NOTE — Progress Notes (Signed)
PT Cancellation and Discharge Note  Patient Details Name: [name] MRN: [id number] DOB: 02-05-1926   Cancelled Treatment:    Reason Eval/Treat Not Completed: Other (comment)   Noted plan is to stop HD, Goals of Care are for comfort, and will likely dc to [facility];   PT is no longer congruent with goals of care; Will sign off;   Thank you,  Roney Marion, [place] Pager [phone] Office [phone]    Roney Marion [facility] 09/11/2015, 12:04 PM

## 2015-09-12 MED ORDER — BOOST / RESOURCE BREEZE PO LIQD: 1.0000 | Freq: Three times a day (TID) | ORAL | 0 refills | Status: AC

## 2015-09-12 MED ORDER — SODIUM CHLORIDE 0.9 % IV SOLN: 100.0000 mL | INTRAVENOUS | Status: DC | PRN

## 2015-09-12 MED ORDER — LIDOCAINE HCL (PF) 1 % IJ SOLN: 5.0000 mL | INTRAMUSCULAR | Status: DC | PRN

## 2015-09-12 MED ORDER — LIDOCAINE-PRILOCAINE 2.5-2.5 % EX CREA: 1.0000 "application " | TOPICAL_CREAM | CUTANEOUS | Status: DC | PRN

## 2015-09-12 MED ORDER — PENTAFLUOROPROP-TETRAFLUOROETH EX AERO
1.0000 | INHALATION_SPRAY | CUTANEOUS | Status: DC | PRN
Start: 2015-09-12 — End: 2015-09-12

## 2015-09-12 MED ORDER — ONDANSETRON HCL 4 MG PO TABS: 4.0000 mg | ORAL_TABLET | Freq: Three times a day (TID) | ORAL | 0 refills | Status: AC | PRN

## 2015-09-12 MED ORDER — ALTEPLASE 2 MG IJ SOLR
2.0000 mg | Freq: Once | INTRAMUSCULAR | Status: DC | PRN
Start: 2015-09-12 — End: 2015-09-12

## 2015-09-12 MED ORDER — HEPARIN SODIUM (PORCINE) 1000 UNIT/ML DIALYSIS: 1000.0000 [IU] | INTRAMUSCULAR | Status: DC | PRN

## 2015-09-12 MED ORDER — PRO-STAT SUGAR FREE PO LIQD: 30.0000 mL | Freq: Two times a day (BID) | ORAL | 0 refills | Status: AC

## 2015-09-12 MED ORDER — QUETIAPINE FUMARATE 25 MG PO TABS: 12.5000 mg | ORAL_TABLET | Freq: Every day | ORAL | Status: AC

## 2015-09-12 NOTE — Discharge Summary (Addendum)
Physician Discharge Summary  [name] PQZ:[id number] DOB: 13-Jul-1926 DOA: 08/28/2015  PCP: [name]., MD  Admit date: 08/28/2015 Discharge date: 09/12/2015  Time spent: 45 minutes  Recommendations for Outpatient Follow-up:  Patient will be discharged to [facility]  Patient will need to follow up with primary care as needed.  Patient should continue medications as prescribed.  Patient should follow a liquid diet/comfort feeds.  Discharge Diagnoses:  Nausea and vomiting RLL PNA  E. coli bacteremia Chronic bilateral pleural effusion, left more than right Hypotension - acute on chronic  Chronic AFib ESRD on MWF HD Macrocytic anemia Hypokalemia  Elevated alk phos / T bil Thrombocytopenia  Hypothyroid  Hx seizures  History [name] Gout Goals of care  Discharge Condition: Stable  Diet recommendation: Liquid diet/comfort feeds  Filed Weights   09/08/15 2054 09/10/15 2131 09/11/15 2153  Weight: 70.3 kg (154 lb 14.4 oz) 66.6 kg (146 lb 13.2 oz) 67 kg (147 lb 9.6 oz)    History of present illness:  On 08/28/2015 by Dr. Marshell Patterson (ICU) [phone] male with multiple medical problems including AS, Afib, ESRD, HTN, hx Guillain-Barre presented 7/31 with c/o weakness, malaise and fall at home.  In ER had progressive hypotension despite volume and chronic R sided effusion (previously thought ?malignant) and ?infiltrate on CXR.  PCCM called for ICU admit.   Currently denies pain, SOB, chest pain.  Still "feels bad".  Denies noted fevers, chills, sick contacts, n/v/d.  Some mild increased BLE edema over the last week.  Usually gets HD m/w/f - was due today.   Hospital Course:  Workup was significant for Escherichia coli bacteremia, and right lower lobe pneumonia which was treated, patient remained with significant weakness and debilitation, plan to discharge to SNF when he can tolerate hemodialysis in a chair.  Despite treatment, patient has been declining.   Palliative care consulted.  Wife has agreed that patient can stop dialysis and be DNR.  She would like patient to go to [facility]  Nausea and vomiting -Continue liquid diet as patient had witnessed vomiting of breakfast on 09/10/2015 -Today also vomited some of his meds -speech consulted -Continue on clear liquid diet  RLL PNA  -Completed a 5 day course of empiric abx   E. coli bacteremia -Source not clear - ?Ecoli PNA  -transitioned to oral Ceftin, finished total of 14 days.  Chronic bilateral pleural effusion, left more than right -had thoracentesis 02/14/2015 X 1 liter w/ glucose 106 WBC 160 with L >P, cyt POS atypical cells  -has been evaluated by PCCM - signif L sided effusion noted on f/u CXR 8/5 AM, progressing since prior CXRs  -clinically stable at present, no respiratory distress or hypoxia  -can be followed as an outpatient with [name].  Hypotension - acute on chronic  -Sepsis w/ bacteremia on baseline of chronic hypotension  -now off pressors  -Continue midodrine -blood pressure stable   Chronic AFib -not on anticoagulation - heart rate well controlled  ESRD on MWF HD -Nephrology following - hemodialysis has been discontinued after speaking with the patient's wife and son.  Macrocytic anemia -T62 and folic acid levels normal/high - hemoglobin stable, currently 10.9  Hypokalemia  -Resolved  Elevated alk phos / T bil -on a downward trend - no clear etiology - potentially related to chronic right pleural effusion  Thrombocytopenia  -Likely due to gram-negative bacteremia -Platelets 95  Hypothyroid  -Continue home synthroid  Hx seizures  -Continue home lamictal   History  [name]  Gout -Swollen right knee, Xray showed moderate suprapatellar effusion -Spoke with orthopedics, [name], via phone, who recommended uric acid level and colchicine, outpatient follow up in a few weeks. -continue allopurinol -Will discontinue  indocin  Goals of care -Spoke with [name] who is more familiar with the patient than myself. He feels the patient has become more debilitated Patterson his recent illnesses.  -Patient currently is a partial code, no CPR or intubation. -Palliative care consulted and appreciated -Both [name] and [name] spoke with patient's wife via phone. She has agreed that patient should be DNR and to stop dialysis treatments. Would like [facility] with wife and son via phone (8/15), would like for patient to go to residential hospice, [facility] if available, [facility] as a back up. They do not want patient to go to SNF (due to cost) or want patient to return home as this may be a burden on patient's wife.  -Plan is for discharge to [facility] today.   Code status: DNR  Consultants Nephrology PCCM Palliative care  Procedures  None  Discharge Exam: Vitals:   09/12/15 0534 09/12/15 0907  BP: 110/60 108/77  Pulse: 75 68  Resp: 17 17  Temp: 98.2 F (36.8 C) 97.5 F (36.4 C)   Exam  General: Well developed, chronically ill appearing, no distress  HEENT: NCAT,mucous membranes moist.   Cardiovascular: S1 S2 auscultated, 2/6 SEM, irregular  Respiratory: Diminished breath sounds  Abdomen: Soft, nontender, nondistended, + bowel sounds  Extremities: warm dry without cyanosis clubbing. Trace ankle edema  Neuro: AAOx2, nonfocal  Psych: Appropriate mood and affect  Discharge Instructions Discharge Instructions    Discharge instructions    Complete by:  As directed   Patient will be discharged to [facility].  Patient will need to follow up with primary care as needed.  Patient should continue medications as prescribed.  Patient should follow a liquid diet/comfort feeds.     Current Discharge Medication List    START taking these medications   Details  Amino Acids-Protein Hydrolys (FEEDING SUPPLEMENT, PRO-STAT SUGAR FREE 64,) LIQD Take 30 mLs by mouth 2  (two) times daily. Qty: 900 mL, Refills: 0    feeding supplement (BOOST / RESOURCE BREEZE) LIQD Take 1 Container by mouth 3 (three) times daily with meals. Refills: 0    ondansetron (ZOFRAN) 4 MG tablet Take 1 tablet (4 mg total) by mouth every 8 (eight) hours as needed for nausea or vomiting. Qty: 20 tablet, Refills: 0    QUEtiapine (SEROQUEL) 25 MG tablet Take 0.5 tablets (12.5 mg total) by mouth at bedtime.      CONTINUE these medications which have NOT CHANGED   Details  allopurinol (ZYLOPRIM) 100 MG tablet Take 100 mg by mouth daily.    B Complex-C-Folic Acid (DIALYVITE TABLET) TABS Take 1 tablet by mouth daily.     calcium carbonate (TUMS - DOSED IN MG ELEMENTAL CALCIUM) 500 MG chewable tablet Chew 1 tablet by mouth 2 (two) times daily with a meal.     folic acid (FOLVITE) 1 MG tablet Take 1 mg by mouth daily.      lamoTRIgine (LAMICTAL) 100 MG tablet TAKE 1/2 TABLET BY MOUTH EVERY MORNING AND 1 TABLET AT BEDTIME Qty: 135 tablet, Refills: 3    latanoprost (XALATAN) 0.005 % ophthalmic solution Place 1 drop into both eyes at bedtime.     levothyroxine (SYNTHROID, LEVOTHROID) 75 MCG tablet Take 75 mcg by mouth daily before breakfast.  omeprazole (PRILOSEC) 20 MG capsule Take 20 mg by mouth daily.    sertraline (ZOLOFT) 25 MG tablet Take 1 tablet (25 mg total) by mouth daily. Qty: 90 tablet, Refills: 3    timolol (BETIMOL) 0.5 % ophthalmic solution Place 1 drop into both eyes daily.      STOP taking these medications     aspirin 81 MG tablet      carvedilol (COREG) 25 MG tablet      Cholecalciferol (VITAMIN D-3 PO)      mirtazapine (REMERON) 30 MG tablet      simvastatin (ZOCOR) 20 MG tablet        No Known Allergies Follow-up Information    REDDING Angelique Blonder., MD. Schedule an appointment as soon as possible for a visit today.   Specialty:  Family Medicine Why:  As needed Contact information: [address] [place] [address] [phone]              The results of significant diagnostics from this hospitalization (including imaging, microbiology, ancillary and laboratory) are listed below for reference.    Significant Diagnostic Studies: Ct Head Wo Contrast  Result Date: 08/28/2015 CLINICAL DATA:  Several recent falls. Chronic renal failure. Lethargy. EXAM: CT HEAD WITHOUT CONTRAST CT CERVICAL SPINE WITHOUT CONTRAST TECHNIQUE: Multidetector CT imaging of the head and cervical spine was performed following the standard protocol without intravenous contrast. Multiplanar CT image reconstructions of the cervical spine were also generated. COMPARISON:  Head CT April 02, 2013 FINDINGS: CT HEAD FINDINGS There is mild diffuse atrophy which is stable. There is no intracranial mass, hemorrhage, extra-axial fluid collection, or midline shift. There is mild patchy small vessel disease in the centra semiovale bilaterally. There are scattered punctate basal ganglia lacunar type infarcts, stable. There is been a prior small infarct in the anterior limb of the right external capsule, stable. No acute infarct evident. Bony calvarium appears intact. Bones are osteoporotic, particularly in the medial sphenoid bone regions, stable. Mastoid air cells are clear. There is no hyperdense vessel. There is calcification in the carotid siphon and cavernous carotid artery regions. There is a retention cyst in the left maxillary antrum. Other paranasal sinuses are clear. Orbits appear symmetric bilaterally. CT CERVICAL SPINE FINDINGS There is no demonstrable acute fracture. There is just over 3 mm of retrolisthesis of C4 on C5. There is no other spondylolisthesis. Prevertebral soft tissues and predental space regions are normal. There is severe disc space narrowing at C3-4, C4-5, C5-6, and C6-7. There is moderate disc space narrowing at C7-T1. There is facet hypertrophy at multiple levels bilaterally. There is carotid artery calcification bilaterally. There is a sizable  pleural effusion on the left. Calcification is noted in visualized innominate arteries. IMPRESSION: CT head: Atrophy with prior small basal ganglia region lacunar type infarcts and mild periventricular small vessel disease. No hemorrhage or acute infarct evident. No mass or midline shift. There are foci of arterial vascular calcification. There is a retention cyst in the left maxillary antrum. Bones somewhat osteoporotic. CT cervical spine: No fracture. Mild spondylolisthesis at C4-5, felt to be due to underlying spondylosis. Multilevel arthropathy. Calcification in multiple vessels including both carotid arteries. Sizable right pleural effusion noted. Electronically Signed   By: Lowella Grip III M.D.   On: 08/28/2015 10:01  Ct Cervical Spine Wo Contrast  Result Date: 08/28/2015 CLINICAL DATA:  Several recent falls. Chronic renal failure. Lethargy. EXAM: CT HEAD WITHOUT CONTRAST CT CERVICAL SPINE WITHOUT CONTRAST TECHNIQUE: Multidetector CT imaging of the  head and cervical spine was performed following the standard protocol without intravenous contrast. Multiplanar CT image reconstructions of the cervical spine were also generated. COMPARISON:  Head CT April 02, 2013 FINDINGS: CT HEAD FINDINGS There is mild diffuse atrophy which is stable. There is no intracranial mass, hemorrhage, extra-axial fluid collection, or midline shift. There is mild patchy small vessel disease in the centra semiovale bilaterally. There are scattered punctate basal ganglia lacunar type infarcts, stable. There is been a prior small infarct in the anterior limb of the right external capsule, stable. No acute infarct evident. Bony calvarium appears intact. Bones are osteoporotic, particularly in the medial sphenoid bone regions, stable. Mastoid air cells are clear. There is no hyperdense vessel. There is calcification in the carotid siphon and cavernous carotid artery regions. There is a retention cyst in the left maxillary antrum.  Other paranasal sinuses are clear. Orbits appear symmetric bilaterally. CT CERVICAL SPINE FINDINGS There is no demonstrable acute fracture. There is just over 3 mm of retrolisthesis of C4 on C5. There is no other spondylolisthesis. Prevertebral soft tissues and predental space regions are normal. There is severe disc space narrowing at C3-4, C4-5, C5-6, and C6-7. There is moderate disc space narrowing at C7-T1. There is facet hypertrophy at multiple levels bilaterally. There is carotid artery calcification bilaterally. There is a sizable pleural effusion on the left. Calcification is noted in visualized innominate arteries. IMPRESSION: CT head: Atrophy with prior small basal ganglia region lacunar type infarcts and mild periventricular small vessel disease. No hemorrhage or acute infarct evident. No mass or midline shift. There are foci of arterial vascular calcification. There is a retention cyst in the left maxillary antrum. Bones somewhat osteoporotic. CT cervical spine: No fracture. Mild spondylolisthesis at C4-5, felt to be due to underlying spondylosis. Multilevel arthropathy. Calcification in multiple vessels including both carotid arteries. Sizable right pleural effusion noted. Electronically Signed   By: Lowella Grip III M.D.   On: 08/28/2015 10:01  Dg Chest Port 1 View  Result Date: 09/04/2015 CLINICAL DATA:  Pleural effusions. Short of breath. Subsequent encounter. EXAM: PORTABLE CHEST 1 VIEW COMPARISON:  09/02/2015 FINDINGS: Large left and moderate right pleural effusions upper similar to the prior exam. There is vascular congestion and interstitial thickening most evident centrally and in the lung bases. Cardiac silhouette is mostly obscured.  No pneumothorax. IMPRESSION: 1. Pulmonary edema with large left and moderate right pleural effusions. Additional lung base opacity most evident on the left. This is likely atelectasis. Pneumonia is possible. 2. Status post removal of the right internal  jugular central venous line. No other significant change from the prior study. Electronically Signed   By: Lajean Manes M.D.   On: 09/04/2015 08:20   Dg Chest Port 1 View  Result Date: 09/02/2015 CLINICAL DATA:  Pneumonia, hypertension EXAM: PORTABLE CHEST 1 VIEW COMPARISON:  08/29/2015 FINDINGS: RIGHT history venous line noted. Stable cardiac silhouette. There is large LEFT effusion with dense LEFT basilar atelectasis. Moderate RIGHT effusion also noted. Some improvement in upper lobe airspace disease. IMPRESSION: 1. Dense LEFT lower lobe atelectasis with large effusion. Cannot exclude LEFT lower lobe mass. 2. Moderate RIGHT effusion. 3. Some improvement in interstitial/pulmonary edema in the upper lobes. Electronically Signed   By: Suzy Bouchard M.D.   On: 09/02/2015 08:58   Dg Chest Port 1 View  Result Date: 08/29/2015 CLINICAL DATA:  82 year old male central line placement. Initial encounter. EXAM: PORTABLE CHEST 1 VIEW COMPARISON:  0347 hours today and earlier. FINDINGS: Portable AP  semi upright view at 1526 hours. Right IJ approach central line has been placed. Tip projects at the level of the cavoatrial junction. No pneumothorax. Worsening right lung ventilation with increased veiling opacity felt related to pleural effusion. Continued confluent opacification of the left lower lung. Stable cardiomegaly and visible mediastinal contours. Calcified aortic atherosclerosis. IMPRESSION: 1. Right IJ approach central line placed, tip at the cavoatrial junction level. No pneumothorax. 2. Bilateral pleural effusions with bibasilar collapse or consolidation, and some interval worsening of right lung base ventilation. Electronically Signed   By: Genevie Ann M.D.   On: 08/29/2015 16:00   Dg Chest Port 1 View  Result Date: 08/29/2015 CLINICAL DATA:  Community acquired pneumonia. EXAM: PORTABLE CHEST 1 VIEW COMPARISON:  08/28/2015 FINDINGS: The patient is mildly rotated to the left, and multiple skin folds are  again noted. The cardiomediastinal silhouette is unchanged. Moderate left and small right pleural effusions are unchanged. Underlying left lung consolidation is unchanged, as are opacities throughout the right lung. No definite pneumothorax is identified. IMPRESSION: Unchanged left larger than right pleural effusions and bilateral lung opacities concerning for pneumonia. Electronically Signed   By: Logan Bores M.D.   On: 08/29/2015 07:11   Dg Chest Port 1 View  Result Date: 08/28/2015 CLINICAL DATA:  Shortness of breath and cough EXAM: PORTABLE CHEST 1 VIEW COMPARISON:  March 09, 2015 FINDINGS: Prominent skin folds are seen over the right upper and lateral chest. No pneumothorax. A moderate left pleural effusion and underlying opacity is stable. A new right effusion is noted. There is also opacity throughout the right mid and lower lung. No other interval changes. IMPRESSION: 1. Stable left moderate-sized pleural effusion and underlying opacity. 2. New right pleural effusion and underlying opacity. More diffuse opacity seen throughout the right mid and lower lungs suggestive of infiltrate. Recommend follow-up to resolution. Electronically Signed   By: Dorise Bullion III M.D   On: 08/28/2015 10:08  Dg Foot Complete Right  Result Date: 08/28/2015 CLINICAL DATA:  Chronic right foot numbness. EXAM: RIGHT FOOT COMPLETE - 3+ VIEW COMPARISON:  None. FINDINGS: There is no evidence of fracture or dislocation. There is no evidence of arthropathy or other focal bone abnormality. Vascular calcifications are noted. IMPRESSION: No acute abnormality seen in the right foot. Electronically Signed   By: Marijo Conception, M.D.   On: 08/28/2015 10:10  Dg Knee 2 Views Right  Result Date: 09/07/2015 CLINICAL DATA:  Chronic right knee pain without known injury. EXAM: RIGHT KNEE - 3 VIEW COMPARISON:  None. FINDINGS: No fracture or dislocation is noted. Moderate suprapatellar joint effusion is noted. Vascular calcifications  are noted. Severe narrowing of medial joint space is noted. IMPRESSION: Severe degenerative joint disease is noted medially. Moderate suprapatellar joint effusion is noted. No acute abnormality seen in the right knee. Electronically Signed   By: Marijo Conception, M.D.   On: 09/07/2015 15:36   US Abdomen Limited Ruq  Result Date: 08/28/2015 CLINICAL DATA:  Elevated LFTs. EXAM: US ABDOMEN LIMITED - RIGHT UPPER QUADRANT COMPARISON:  Thoracentesis ultrasound 03/02/2015 and 02/14/2015. FINDINGS: Gallbladder: Gallbladder wall is thickened of, measuring 5 mm. There is edema in the wall. Sludge is present within the gallbladder. More echogenic material is seen dependently with some shadowing. There is no sonographic Murphy sign. Common bile duct: Diameter: 4 mm, within normal limits Liver: No focal lesions are present.  Liver is of normal echotexture. Abdominal ascites and a right pleural effusion are noted. Incidental note is made of  an atrophic echogenic kidney compatible with end-stage renal disease. IMPRESSION: 1. Normal appearance of the liver. 2. Gallbladder wall thickening and edema may be secondary to anasarca and congestive heart failure. 3. Layering sludge with probable small stones. 4. No sonographic Murphy sign. 5. Moderate ascites. 6. Large right pleural effusion. 7. Atrophic echogenic right kidney compatible with end-stage renal disease. Electronically Signed   By: San Morelle M.D.   On: 08/28/2015 16:54    Microbiology: No results found for this or any previous visit (from the past 240 hour(s)).   Labs: Basic Metabolic Panel:  Recent Labs Lab 09/06/15 0800 09/08/15 0451 09/09/15 0336 09/10/15 0526 09/11/15 0554  NA 133* 134* 136 135 135  K 4.6 4.6 3.9 4.3 4.0  CL 95* 94* 95* 93* 95*  CO2 26 28 29 28 28   GLUCOSE 85 80 74 91 83  BUN 36* 31* 16 28* 37*  CREATININE 4.80* 4.26* 2.86* 3.91* 4.54*  CALCIUM 8.7* 8.7* 8.1* 8.5* 8.4*   Liver Function Tests: No results for input(s):  AST, ALT, ALKPHOS, BILITOT, PROT, ALBUMIN in the last 168 hours. No results for input(s): LIPASE, AMYLASE in the last 168 hours. No results for input(s): AMMONIA in the last 168 hours. CBC:  Recent Labs Lab 09/06/15 0800 09/08/15 0451 09/09/15 0336 09/10/15 0526 09/11/15 0554  WBC 7.5 6.3 6.1 6.5 5.2  HGB 10.9* 10.7* 10.6* 11.3* 10.9*  HCT 34.1* 33.2* 32.8* 35.6* 34.5*  MCV 105.2* 105.4* 104.8* 105.6* 106.5*  PLT 110* 103* 106* 97* 95*   Cardiac Enzymes: No results for input(s): CKTOTAL, CKMB, CKMBINDEX, TROPONINI in the last 168 hours. BNP: BNP (last 3 results) No results for input(s): BNP in the last 8760 hours.  ProBNP (last 3 results)  Recent Labs  02/21/15 1413  PROBNP 1,353.0*    CBG: No results for input(s): GLUCAP in the last 168 hours.     SignedCristal Ford  [facility] 09/12/2015, 4:23 PM   Addendum- Coding query Left hip Stage II ulcer Wound care consulted Unknown if this was present on admission

## 2015-09-12 NOTE — Progress Notes (Signed)
Spoke with pts family (son and wife) regarding decision to move to hospice. We attempted to discuss this with the patient.  Family spoke with MD and the plan is still to move to hospice.

## 2015-09-12 NOTE — Clinical Social Work Note (Signed)
Patient discharged to [facility] today, transported by ambulance [facility]). Family aware of discharge and completed admissions paperwork for hospice facility.   Shataya Winkles Givens, MSW, LCSW Licensed Clinical Social Worker [place] [phone]

## 2015-09-12 NOTE — Progress Notes (Addendum)
PROGRESS NOTE    KAZIM CORRALES  ITG:[id number] DOB: March 03, 1926 DOA: 08/28/2015 PCP: Angelina Sheriff., MD   Chief Complaint  Patient presents with  . Fall     Brief Narrative:  82yo male with hx of AoS, Afib, ESRD, HTN, and Guillain-Barre who presented 7/31 with c/o weakness, malaise and a fall at home. In the ER he had progressive hypotension despite volume and chronic R sided effusion (previously thought ?malignant) with possible infiltrate on CXR. PCCM admitted the pt to the ICU., Workup was significant for Escherichia coli bacteremia, and right lower lobe pneumonia which was treated, patient remained with significant weakness and debilitation, plan to discharge to SNF when he can tolerate hemodialysis in a chair.  Despite treatment, patient has been declining.  Palliative care consulted.  Wife has agreed that patient can stop dialysis and be DNR.  She would like patient to go to [facility].  Assessment & Plan  Nausea and vomiting -Continue liquid diet as patient had witnessed vomiting of breakfast on 09/10/2015 -Today also vomited some of his meds -speech consulted -Continue on clear liquid diet  RLL PNA  -Completed a 5 day course of empiric abx   E. coli bacteremia -Source not clear - ?Ecoli PNA  -transitioned to oral Ceftin, finished total of 14 days.  Chronic bilateral pleural effusion, left more than right -had thoracentesis 02/14/2015 X 1 liter w/ glucose 106 WBC 160 with L > P, cyt POS atypical cells  -has been evaluated by PCCM - signif L sided effusion noted on f/u CXR 8/5 AM, progressing since prior CXRs  -clinically stable at present, no respiratory distress or hypoxia  -can be followed as an outpatient with Dr. Melvyn Novas.  Hypotension - acute on chronic  -Sepsis w/ bacteremia on baseline of chronic hypotension  -now off pressors  -Continue midodrine -blood pressure stable   Chronic AFib -not on anticoagulation - heart rate well controlled  ESRD on  MWF HD -Nephrology following - hemodialysis has been discontinued after speaking with the patient's wife and son.  Macrocytic anemia -A30 and folic acid levels normal/high - hemoglobin stable, currently 10.9  Hypokalemia  -Resolved  Elevated alk phos / T bil -on a downward trend - no clear etiology - potentially related to chronic right pleural effusion  Thrombocytopenia  -Likely due to gram-negative bacteremia -Platelets 95  Hypothyroid  -Continue home synthroid  Hx seizures  -Continue home lamictal   History [name]  Gout -Swollen right knee, Xray showed moderate suprapatellar effusion -Spoke with orthopedics, Dr. Sharol Given, via phone, who recommended uric acid level and colchicine, outpatient follow up in a few weeks. -continue allopurinol -Will discontinue indocin  Goals of care -Spoke with Dr. Jonnie Finner who is more familiar with the patient than myself. He feels the patient has become more debilitated given his recent illnesses.  -Patient currently is a partial code, no CPR or intubation. -Palliative care consulted and appreciated -Both Dr. Rowe Pavy and Dr. Jonnie Finner spoke with patient's wife via phone. She has agreed that patient should be DNR and to stop dialysis treatments. Would like [place] with wife and son via phone (8/15), would like for patient to go to residential hospice, [facility] if available, [facility] as a back up. They do not want patient to go to SNF (due to cost) or want patient to return home as this may be a burden on patient's wife.   DVT Prophylaxis  SCDs  Code Status: DNR  Family Communication: Family at bedside.  Disposition Plan: Admitted. Pending Hospice  Consultants Nephrology PCCM Palliative care  Procedures  None  Antibiotics   Anti-infectives    Start     Dose/Rate Route Frequency Ordered Stop   09/04/15 1800  cefUROXime (CEFTIN) tablet 500 mg    Comments:  Pharmacy may adjust dose as indicated   500 mg  Oral Daily after supper 09/04/15 1414 09/11/15 1759   09/04/15 0700  cefUROXime (CEFTIN) tablet 500 mg  Status:  Discontinued    Comments:  Pharmacy may adjust dose as indicated   500 mg Oral 2 times daily with meals 09/03/15 1157 09/04/15 1414   08/29/15 1800  cefTRIAXone (ROCEPHIN) 1 g in dextrose 5 % 50 mL IVPB  Status:  Discontinued     1 g 100 mL/hr over 30 Minutes Intravenous Every 24 hours 08/28/15 1221 08/29/15 0340   08/29/15 1800  cefTRIAXone (ROCEPHIN) 2 g in dextrose 5 % 50 mL IVPB     2 g 100 mL/hr over 30 Minutes Intravenous Every 24 hours 08/29/15 0340 09/03/15 1806   08/28/15 1215  azithromycin (ZITHROMAX) 500 mg in dextrose 5 % 250 mL IVPB     500 mg 250 mL/hr over 60 Minutes Intravenous Every 24 hours 08/28/15 1212 09/01/15 1407   08/28/15 1200  vancomycin (VANCOCIN) 1,500 mg in sodium chloride 0.9 % 500 mL IVPB  Status:  Discontinued     1,500 mg 250 mL/hr over 120 Minutes Intravenous  Once 08/28/15 1146 08/28/15 1212   08/28/15 1130  ceFEPIme (MAXIPIME) 2 g in dextrose 5 % 50 mL IVPB  Status:  Discontinued     2 g 100 mL/hr over 30 Minutes Intravenous  Once 08/28/15 1129 08/28/15 1212   08/28/15 1130  vancomycin (VANCOCIN) IVPB 1000 mg/200 mL premix  Status:  Discontinued     1,000 mg 200 mL/hr over 60 Minutes Intravenous  Once 08/28/15 1129 08/28/15 1146      Subjective:   [name] seen and examined today.  Patient had no compliants today.  Denies chest pain, shortness of breath, abdominal pain.   Objective:   Vitals:   09/11/15 1808 09/11/15 2153 09/12/15 0534 09/12/15 0907  BP: (!) 92/48 108/82 110/60 108/77  Pulse: 90 (!) 58 75 68  Resp: 18 18 17 17   Temp: 97.8 F (36.6 C) 98.4 F (36.9 C) 98.2 F (36.8 C) 97.5 F (36.4 C)  TempSrc: Oral Oral Oral Oral  SpO2:  99% 96% 96%  Weight:  67 kg (147 lb 9.6 oz)    Height:        Intake/Output Summary (Last 24 hours) at 09/12/15 1334 Last data filed at 09/12/15 1000  Gross per 24 hour  Intake               600 ml  Output              200 ml  Net              400 ml   Filed Weights   09/08/15 2054 09/10/15 2131 09/11/15 2153  Weight: 70.3 kg (154 lb 14.4 oz) 66.6 kg (146 lb 13.2 oz) 67 kg (147 lb 9.6 oz)    Exam  General: Well developed, chronically ill appearing, no distress  HEENT: NCAT,mucous membranes moist.   Cardiovascular: S1 S2 auscultated, 2/6 SEM, irregular  Respiratory: Diminished breath sounds  Abdomen: Soft, nontender, nondistended, + bowel sounds  Extremities: warm dry without cyanosis clubbing. Trace ankle edema  Neuro: AAOx2, nonfocal  Psych:  Appropriate mood and affect   Data Reviewed: I have personally reviewed following labs and imaging studies  CBC:  Recent Labs Lab 09/06/15 0800 09/08/15 0451 09/09/15 0336 09/10/15 0526 09/11/15 0554  WBC 7.5 6.3 6.1 6.5 5.2  HGB 10.9* 10.7* 10.6* 11.3* 10.9*  HCT 34.1* 33.2* 32.8* 35.6* 34.5*  MCV 105.2* 105.4* 104.8* 105.6* 106.5*  PLT 110* 103* 106* 97* 95*   Basic Metabolic Panel:  Recent Labs Lab 09/06/15 0800 09/08/15 0451 09/09/15 0336 09/10/15 0526 09/11/15 0554  NA 133* 134* 136 135 135  K 4.6 4.6 3.9 4.3 4.0  CL 95* 94* 95* 93* 95*  CO2 26 28 29 28 28   GLUCOSE 85 80 74 91 83  BUN 36* 31* 16 28* 37*  CREATININE 4.80* 4.26* 2.86* 3.91* 4.54*  CALCIUM 8.7* 8.7* 8.1* 8.5* 8.4*   GFR: Estimated Creatinine Clearance: 10.9 mL/min (by C-G formula based on SCr of 4.54 mg/dL). Liver Function Tests: No results for input(s): AST, ALT, ALKPHOS, BILITOT, PROT, ALBUMIN in the last 168 hours. No results for input(s): LIPASE, AMYLASE in the last 168 hours. No results for input(s): AMMONIA in the last 168 hours. Coagulation Profile: No results for input(s): INR, PROTIME in the last 168 hours. Cardiac Enzymes: No results for input(s): CKTOTAL, CKMB, CKMBINDEX, TROPONINI in the last 168 hours. BNP (last 3 results)  Recent Labs  02/21/15 1413  PROBNP 1,353.0*   HbA1C: No results for  input(s): HGBA1C in the last 72 hours. CBG: No results for input(s): GLUCAP in the last 168 hours. Lipid Profile: No results for input(s): CHOL, HDL, LDLCALC, TRIG, CHOLHDL, LDLDIRECT in the last 72 hours. Thyroid Function Tests: No results for input(s): TSH, T4TOTAL, FREET4, T3FREE, THYROIDAB in the last 72 hours. Anemia Panel: No results for input(s): VITAMINB12, FOLATE, FERRITIN, TIBC, IRON, RETICCTPCT in the last 72 hours. Urine analysis: No results found for: COLORURINE, APPEARANCEUR, LABSPEC, PHURINE, GLUCOSEU, HGBUR, BILIRUBINUR, KETONESUR, PROTEINUR, UROBILINOGEN, NITRITE, LEUKOCYTESUR Sepsis Labs: @LABRCNTIP (procalcitonin:4,lacticidven:4)  ) No results found for this or any previous visit (from the past 240 hour(s)).    Radiology Studies: No results found.   Scheduled Meds: . allopurinol  100 mg Oral Daily  . antiseptic oral rinse  7 mL Mouth Rinse q12n4p  . calcium carbonate  1 tablet Oral TID WC  . chlorhexidine  15 mL Mouth Rinse BID  . darbepoetin (ARANESP) injection - DIALYSIS  60 mcg Intravenous Q Wed-HD  . feeding supplement  1 Container Oral TID WC  . feeding supplement (PRO-STAT SUGAR FREE 64)  30 mL Oral BID  . folic acid  1 mg Oral Daily  . lamoTRIgine  100 mg Oral QHS  . lamoTRIgine  50 mg Oral Daily  . latanoprost  1 drop Both Eyes QHS  . levothyroxine  75 mcg Oral QAC breakfast  . midodrine  10 mg Oral Q6H  . QUEtiapine  12.5 mg Oral QHS  . senna-docusate  1 tablet Oral BID  . sertraline  25 mg Oral Daily  . simvastatin  20 mg Oral QHS  . timolol  1 drop Both Eyes Daily   Continuous Infusions:    LOS: 15 days   Time Spent in minutes   30 minutes  Magdelene Ruark D.O. on 09/12/2015 at 1:34 PM  Between 7am to 7pm - Pager - [phone]  After 7pm go to [email or web address] - password [email or web address]  And look for the night coverage person covering for me after hours  [facility] Office  [phone]

## 2015-09-12 NOTE — Care Management Note (Signed)
Case Management Note  Patient Details  Name: [name] MRN: [id number] Date of Birth: August 16, 1926  Subjective/Objective:       CM following for progression and d/c planning.              Action/Plan: 09/12/2015 Pt for d/c to [facility] for inpt residential hospice care.  Expected Discharge Date:  09/05/15               Expected Discharge Plan:  [place]  In-House Referral:  Clinical Social Work  Discharge planning Services  CM Consult  Post Acute Care Choice:  NA Choice offered to:  NA  DME Arranged:  N/A DME Agency:  NA  HH Arranged:  NA HH Agency:  NA  Status of Service:  Completed, signed off  If discussed at [employer] of Stay Meetings, dates discussed:    Additional Comments:  Adron Bene, RN 09/12/2015, 3:59 PM

## 2015-09-29 DEATH — deceased

## 2015-10-03 ENCOUNTER — Telehealth: Payer: Self-pay | Admitting: Adult Health

## 2015-10-03 NOTE — Telephone Encounter (Signed)
I called the patient's wife. Offered my condolences. It was a pleasure to be a part of Mr. Colocho care.

## 2015-10-03 NOTE — Telephone Encounter (Signed)
FYI-Patient's wife called and wanted to let you know that the patient passed away on Oct 05, 2015. She wants to thank you for the wonderful care that you had given him.

## 2016-02-08 ENCOUNTER — Ambulatory Visit: Payer: Medicare HMO | Admitting: Neurology

## 2016-10-09 IMAGING — US US THORACENTESIS ASP PLEURAL SPACE W/IMG GUIDE
1 series · 6 of 6 positions shown · non-contrast
Comparison: Chest X-ray done 02/09/2015.

CLINICAL DATA: Shortness of breath. Bilateral pleural effusions.
Left pleural effusion appears loculated. Right pleural effusion
appeared larger on ultrasound than on Chest X-ray done on 02/09/2015.
Initial request was for left thoracentesis. Discussed with Dr. Gabriiel Hilario.
Changed request to diagnostic and therapeutic right thoracentesis.

EXAM:
ULTRASOUND GUIDED LEFT THORACENTESIS

[Series 1: us thoracentesis asp pleural space w/img guide · 0.23mm/px · 6 of 6 slices shown]
[im 1/6]
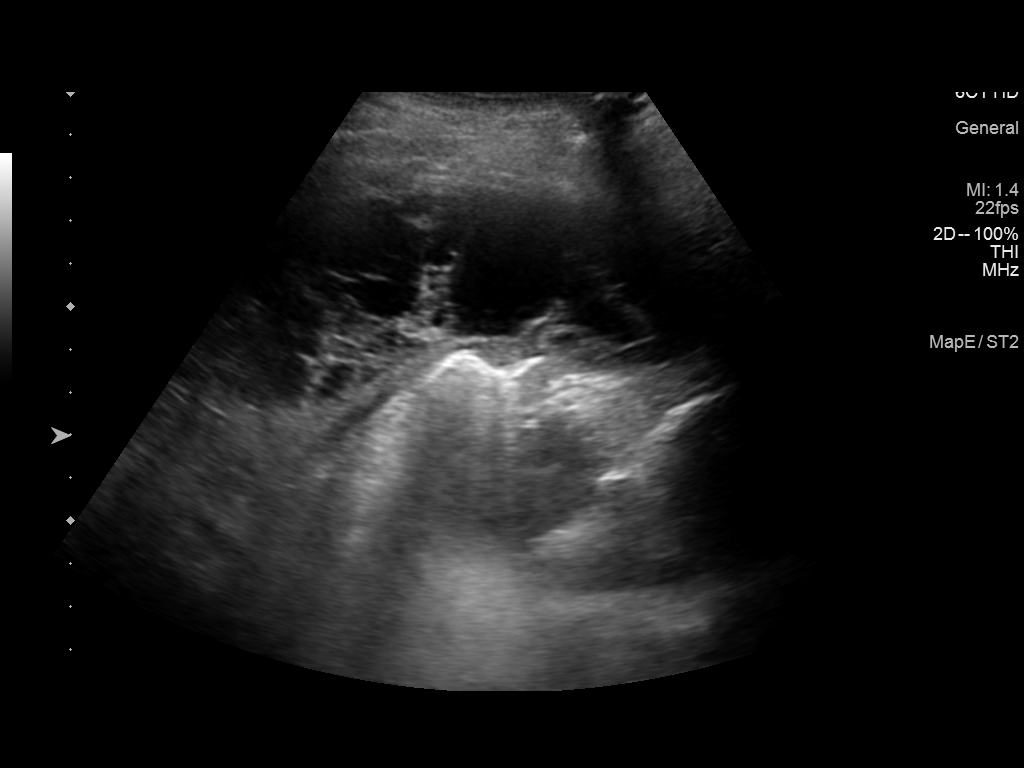
[im 2/6]
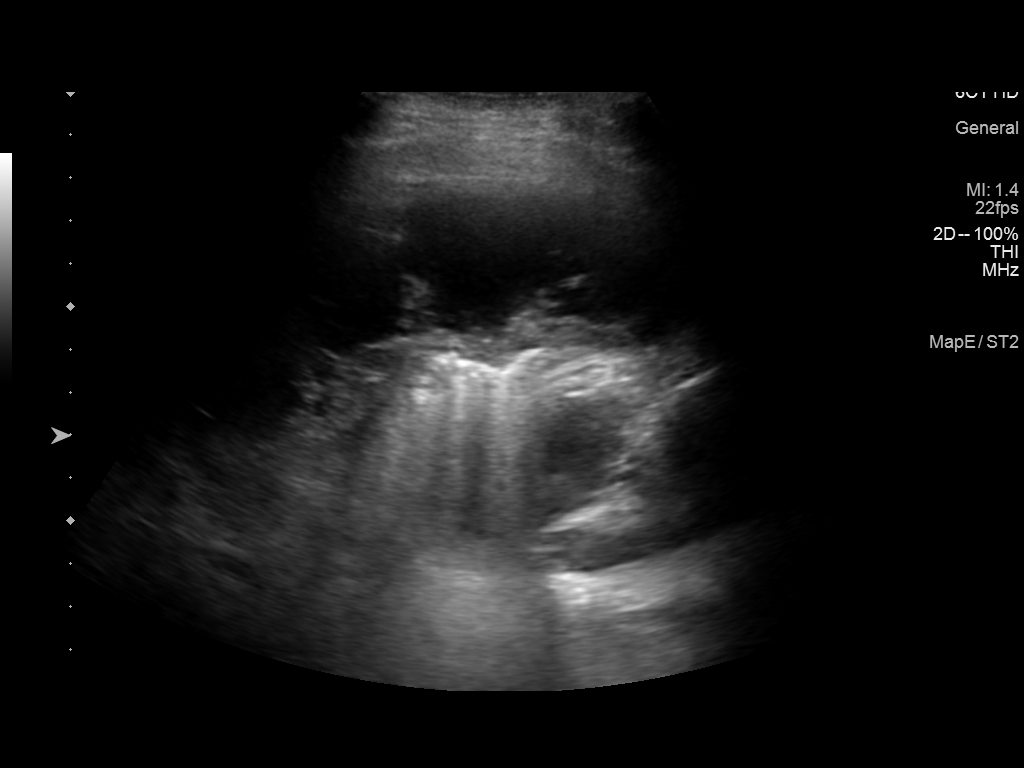
[im 3/6]
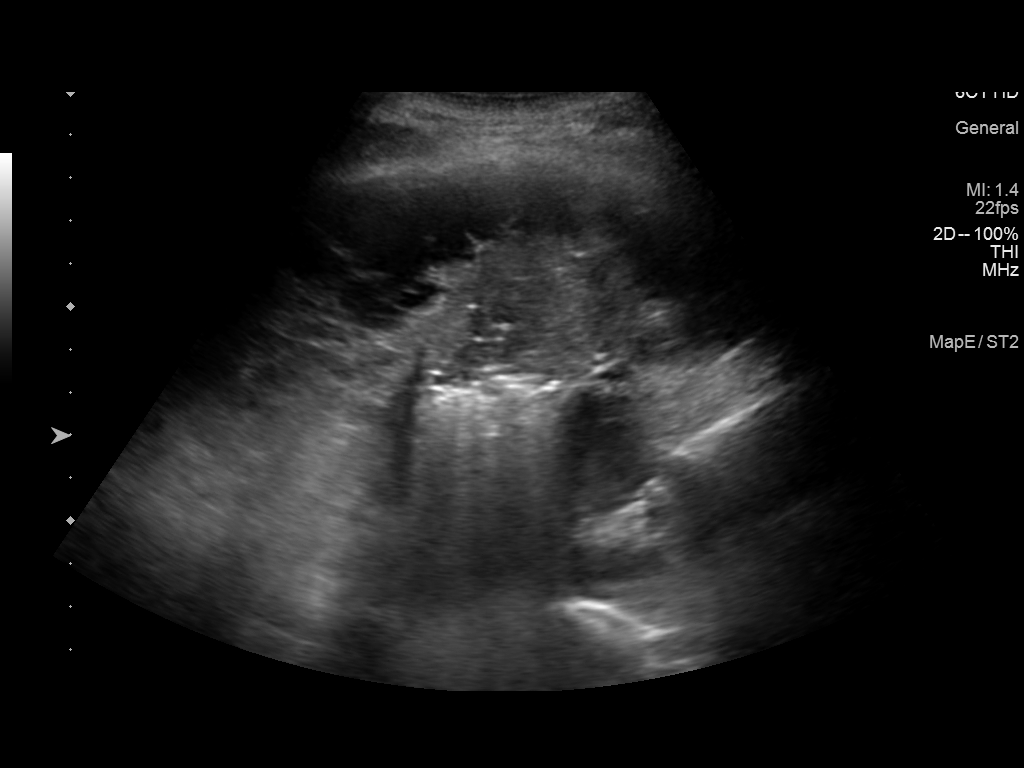
[im 4/6]
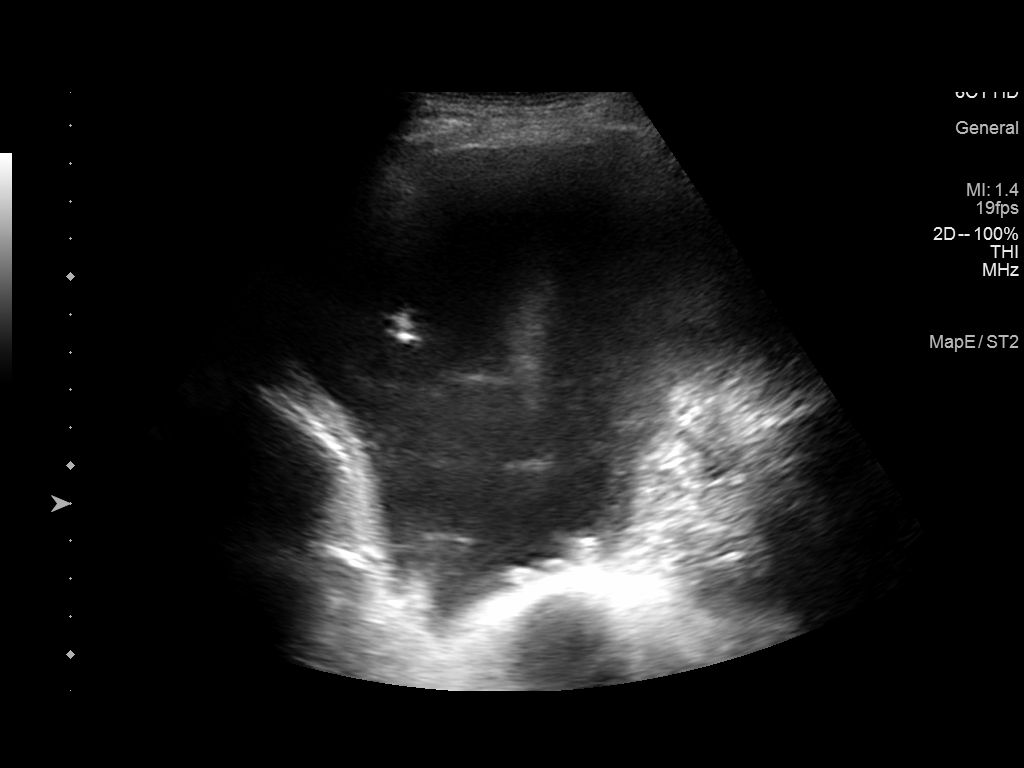
[im 5/6]
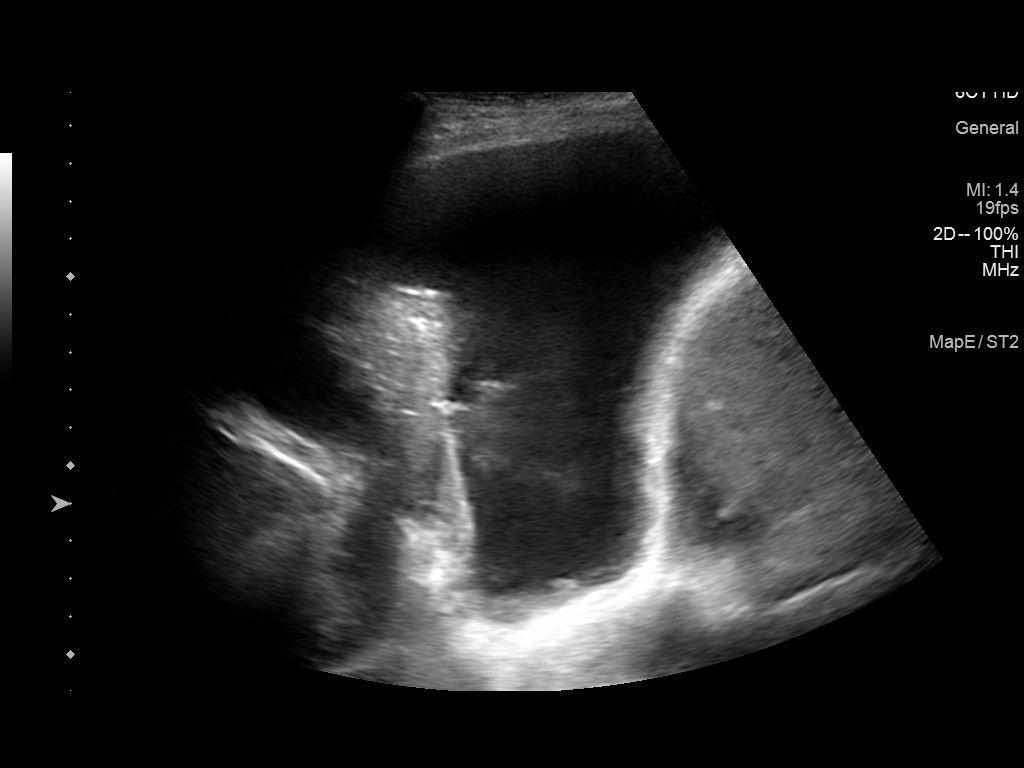
[im 6/6]
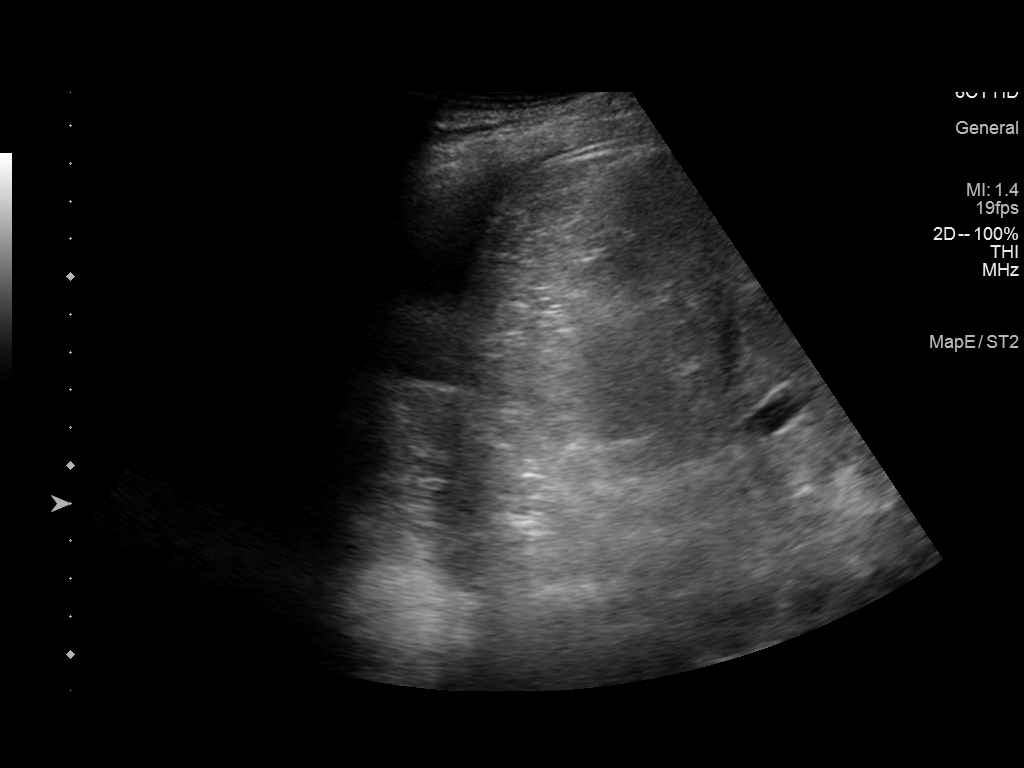

[6 of 6 positions shown; findings below may reference images not displayed]

PROCEDURE:
An ultrasound guided thoracentesis was thoroughly discussed with the
patient and questions answered. The benefits, risks, alternatives
and complications were also discussed. The patient understands and
wishes to proceed with the procedure. Written consent was obtained.

Ultrasound was performed to localize and mark an adequate pocket of
fluid in the right chest. The area was then prepped and draped in
the normal sterile fashion. 1% Lidocaine was used for local
anesthesia. Under ultrasound guidance a Safe T Centesis catheter was
introduced. Thoracentesis was performed. The catheter was removed
and a dressing applied.

COMPLICATIONS:
None immediate.
FINDINGS: A total of approximately 1 liter of clear yellow fluid was removed.
A fluid sample was sent for laboratory analysis.
IMPRESSION: Successful ultrasound guided right thoracentesis yielding 1 liter of
pleural fluid.

## 2016-10-09 IMAGING — CR DG CHEST 1V
1 series · 1 of 1 positions shown · non-contrast
Comparison: 02/09/2015 chest radiograph.

CLINICAL DATA: Status post right thoracentesis

EXAM:
CHEST 1 VIEW

[w chest pa]
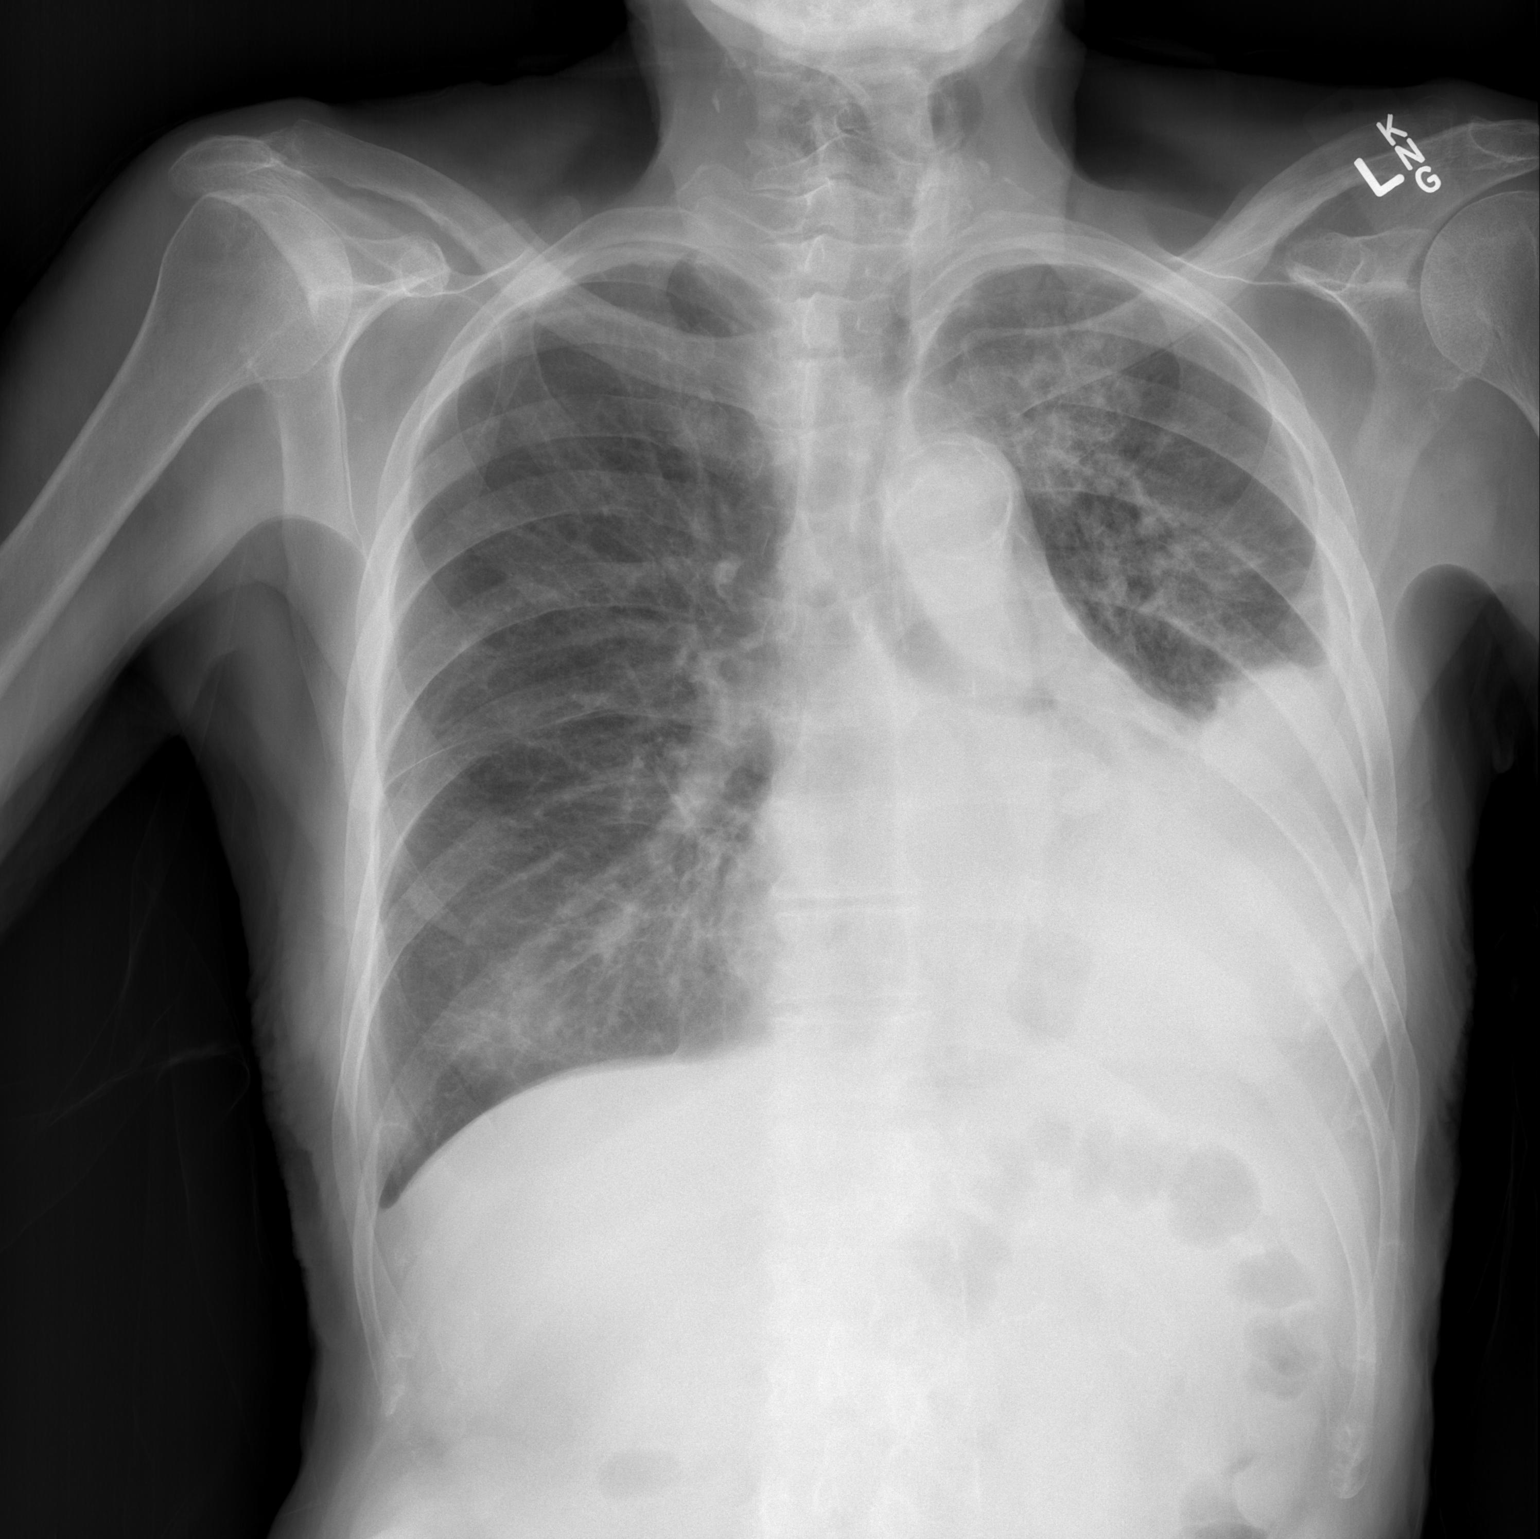

[1 of 1 positions shown; findings below may reference images not displayed]

FINDINGS: Stable cardiomediastinal silhouette with top-normal heart size. No
pneumothorax. Trace residual right pleural effusion, decreased.
Stable moderate left pleural effusion. Nonspecific reticular
opacities in the left upper lung. Persistent consolidation at the
left lung base. No acute consolidative airspace disease.
IMPRESSION: 1. Trace residual right pleural effusion, decreased. No pneumothorax
.
2. Stable moderate left pleural effusion.
3. Stable nonspecific consolidation at the left lung base,
differential includes atelectasis, pneumonia or mass.
4. Stable nonspecific reticular opacities in the left upper lung,
differential includes asymmetric pulmonary edema, scarring or
lymphangitic tumor given the history of colon cancer.
